# Patient Record
Sex: Female | Born: 1955 | Race: Black or African American | Hispanic: No | Smoking: Former smoker
Health system: Southern US, Community
[De-identification: ages and names within clinical notes are randomized; demographics above are authoritative.]

## PROBLEM LIST (undated history)

## (undated) DIAGNOSIS — J45909 Unspecified asthma, uncomplicated: Secondary | ICD-10-CM

## (undated) DIAGNOSIS — I517 Cardiomegaly: Secondary | ICD-10-CM

## (undated) DIAGNOSIS — I5189 Other ill-defined heart diseases: Secondary | ICD-10-CM

## (undated) DIAGNOSIS — D75839 Thrombocytosis, unspecified: Secondary | ICD-10-CM

## (undated) DIAGNOSIS — K5792 Diverticulitis of intestine, part unspecified, without perforation or abscess without bleeding: Secondary | ICD-10-CM

## (undated) DIAGNOSIS — M8580 Other specified disorders of bone density and structure, unspecified site: Secondary | ICD-10-CM

## (undated) DIAGNOSIS — G500 Trigeminal neuralgia: Secondary | ICD-10-CM

## (undated) DIAGNOSIS — Z9221 Personal history of antineoplastic chemotherapy: Secondary | ICD-10-CM

## (undated) DIAGNOSIS — D72829 Elevated white blood cell count, unspecified: Secondary | ICD-10-CM

## (undated) DIAGNOSIS — I10 Essential (primary) hypertension: Secondary | ICD-10-CM

## (undated) DIAGNOSIS — E119 Type 2 diabetes mellitus without complications: Secondary | ICD-10-CM

## (undated) DIAGNOSIS — I8500 Esophageal varices without bleeding: Secondary | ICD-10-CM

## (undated) DIAGNOSIS — Z923 Personal history of irradiation: Secondary | ICD-10-CM

## (undated) DIAGNOSIS — R6510 Systemic inflammatory response syndrome (SIRS) of non-infectious origin without acute organ dysfunction: Secondary | ICD-10-CM

## (undated) DIAGNOSIS — K649 Unspecified hemorrhoids: Secondary | ICD-10-CM

## (undated) DIAGNOSIS — M7989 Other specified soft tissue disorders: Secondary | ICD-10-CM

## (undated) DIAGNOSIS — C801 Malignant (primary) neoplasm, unspecified: Secondary | ICD-10-CM

## (undated) DIAGNOSIS — J40 Bronchitis, not specified as acute or chronic: Secondary | ICD-10-CM

## (undated) DIAGNOSIS — A419 Sepsis, unspecified organism: Secondary | ICD-10-CM

## (undated) DIAGNOSIS — B1920 Unspecified viral hepatitis C without hepatic coma: Secondary | ICD-10-CM

## (undated) DIAGNOSIS — C50919 Malignant neoplasm of unspecified site of unspecified female breast: Secondary | ICD-10-CM

## (undated) HISTORY — DX: Esophageal varices without bleeding: I85.00

## (undated) HISTORY — PX: US LT BREAST BX W LOC DEV 1ST LESION IMG BX SPEC US GUIDE: IMG5431

## (undated) HISTORY — DX: Other specified disorders of bone density and structure, unspecified site: M85.80

## (undated) HISTORY — DX: Other specified soft tissue disorders: M79.89

## (undated) HISTORY — PX: UPPER GI ENDOSCOPY: SHX6162

## (undated) HISTORY — DX: Elevated white blood cell count, unspecified: D72.829

## (undated) HISTORY — DX: Unspecified hemorrhoids: K64.9

## (undated) HISTORY — DX: Diverticulitis of intestine, part unspecified, without perforation or abscess without bleeding: K57.92

---

## 2001-02-12 DIAGNOSIS — Z853 Personal history of malignant neoplasm of breast: Secondary | ICD-10-CM

## 2001-02-12 HISTORY — DX: Personal history of malignant neoplasm of breast: Z85.3

## 2001-02-12 HISTORY — PX: MASTECTOMY: SHX3

## 2001-02-12 HISTORY — PX: BREAST SURGERY: SHX581

## 2004-03-23 ENCOUNTER — Other Ambulatory Visit: Payer: Self-pay

## 2004-03-23 ENCOUNTER — Emergency Department: Payer: Self-pay | Admitting: Emergency Medicine

## 2004-04-26 DIAGNOSIS — B182 Chronic viral hepatitis C: Secondary | ICD-10-CM | POA: Insufficient documentation

## 2004-11-30 DIAGNOSIS — F329 Major depressive disorder, single episode, unspecified: Secondary | ICD-10-CM | POA: Insufficient documentation

## 2004-11-30 DIAGNOSIS — F32A Depression, unspecified: Secondary | ICD-10-CM | POA: Insufficient documentation

## 2006-01-15 ENCOUNTER — Emergency Department: Payer: Self-pay | Admitting: Emergency Medicine

## 2006-01-15 ENCOUNTER — Other Ambulatory Visit: Payer: Self-pay

## 2006-10-06 ENCOUNTER — Emergency Department: Payer: Self-pay | Admitting: Unknown Physician Specialty

## 2006-10-20 ENCOUNTER — Emergency Department: Payer: Self-pay

## 2006-10-20 ENCOUNTER — Other Ambulatory Visit: Payer: Self-pay

## 2006-11-17 ENCOUNTER — Emergency Department: Payer: Self-pay | Admitting: Emergency Medicine

## 2006-12-16 ENCOUNTER — Other Ambulatory Visit: Payer: Self-pay

## 2006-12-17 ENCOUNTER — Inpatient Hospital Stay: Payer: Self-pay

## 2006-12-17 ENCOUNTER — Other Ambulatory Visit: Payer: Self-pay

## 2007-01-30 ENCOUNTER — Ambulatory Visit: Payer: Self-pay | Admitting: Gastroenterology

## 2007-09-29 ENCOUNTER — Emergency Department: Payer: Self-pay | Admitting: Emergency Medicine

## 2007-09-29 ENCOUNTER — Other Ambulatory Visit: Payer: Self-pay

## 2008-09-04 ENCOUNTER — Emergency Department: Payer: Self-pay | Admitting: Emergency Medicine

## 2009-04-27 ENCOUNTER — Emergency Department: Payer: Self-pay | Admitting: Emergency Medicine

## 2013-01-18 ENCOUNTER — Emergency Department: Payer: Self-pay | Admitting: Emergency Medicine

## 2013-01-18 LAB — CBC
HCT: 39.5 % (ref 35.0–47.0)
MCH: 29.8 pg (ref 26.0–34.0)
MCHC: 33.4 g/dL (ref 32.0–36.0)
MCV: 89 fL (ref 80–100)
WBC: 16.2 10*3/uL — ABNORMAL HIGH (ref 3.6–11.0)

## 2013-01-18 LAB — TROPONIN I
Troponin-I: <0.02 ng/mL
Troponin-I: <0.02 ng/mL

## 2013-01-18 LAB — COMPREHENSIVE METABOLIC PANEL
Alkaline Phosphatase: 110 U/L
Bilirubin,Total: 0.2 mg/dL (ref 0.2–1.0)
Calcium, Total: 9.2 mg/dL (ref 8.5–10.1)
Chloride: 106 mmol/L (ref 98–107)
Co2: 29 mmol/L (ref 21–32)
Creatinine: 1.26 mg/dL (ref 0.60–1.30)
EGFR (Non-African Amer.): 47 — ABNORMAL LOW
Glucose: 136 mg/dL — ABNORMAL HIGH (ref 65–99)
Potassium: 3.5 mmol/L (ref 3.5–5.1)
SGOT(AST): 29 U/L (ref 15–37)
Sodium: 138 mmol/L (ref 136–145)
Total Protein: 8.3 g/dL — ABNORMAL HIGH (ref 6.4–8.2)

## 2013-01-18 LAB — CK TOTAL AND CKMB (NOT AT ARMC): CK-MB: 0.6 ng/mL (ref 0.5–3.6)

## 2013-02-11 ENCOUNTER — Emergency Department: Payer: Self-pay | Admitting: Emergency Medicine

## 2013-10-03 ENCOUNTER — Observation Stay: Payer: Self-pay | Admitting: Internal Medicine

## 2013-10-03 LAB — CBC
HCT: 39.7 % (ref 35.0–47.0)
HGB: 12.9 g/dL (ref 12.0–16.0)
MCH: 29.6 pg (ref 26.0–34.0)
MCHC: 32.5 g/dL (ref 32.0–36.0)
MCV: 91 fL (ref 80–100)
Platelet: 372 10*3/uL (ref 150–440)
RBC: 4.36 10*6/uL (ref 3.80–5.20)
RDW: 13.7 % (ref 11.5–14.5)
WBC: 15.5 10*3/uL — ABNORMAL HIGH (ref 3.6–11.0)

## 2013-10-03 LAB — BASIC METABOLIC PANEL
Anion Gap: 11 (ref 7–16)
BUN: 23 mg/dL — ABNORMAL HIGH (ref 7–18)
Calcium, Total: 8.6 mg/dL (ref 8.5–10.1)
Chloride: 105 mmol/L (ref 98–107)
Co2: 24 mmol/L (ref 21–32)
Creatinine: 1.31 mg/dL — ABNORMAL HIGH (ref 0.60–1.30)
EGFR (African American): 52 — ABNORMAL LOW
EGFR (Non-African Amer.): 45 — ABNORMAL LOW
Glucose: 234 mg/dL — ABNORMAL HIGH (ref 65–99)
OSMOLALITY: 291 (ref 275–301)
Potassium: 3.3 mmol/L — ABNORMAL LOW (ref 3.5–5.1)
Sodium: 140 mmol/L (ref 136–145)

## 2013-10-03 LAB — CK TOTAL AND CKMB (NOT AT ARMC)
CK, Total: 110 U/L
CK, Total: 138 U/L
CK-MB: 1.2 ng/mL (ref 0.5–3.6)
CK-MB: 1.2 ng/mL (ref 0.5–3.6)

## 2013-10-03 LAB — TROPONIN I
Troponin-I: <0.02 ng/mL
Troponin-I: <0.02 ng/mL

## 2013-11-27 DIAGNOSIS — J4540 Moderate persistent asthma, uncomplicated: Secondary | ICD-10-CM | POA: Insufficient documentation

## 2014-01-19 DIAGNOSIS — K219 Gastro-esophageal reflux disease without esophagitis: Secondary | ICD-10-CM

## 2014-01-19 DIAGNOSIS — IMO0001 Reserved for inherently not codable concepts without codable children: Secondary | ICD-10-CM | POA: Insufficient documentation

## 2014-01-23 ENCOUNTER — Emergency Department: Payer: Self-pay | Admitting: Emergency Medicine

## 2014-01-23 LAB — BASIC METABOLIC PANEL
Anion Gap: 8 (ref 7–16)
BUN: 16 mg/dL (ref 7–18)
CALCIUM: 8.8 mg/dL (ref 8.5–10.1)
Chloride: 104 mmol/L (ref 98–107)
Co2: 28 mmol/L (ref 21–32)
Creatinine: 1.13 mg/dL (ref 0.60–1.30)
GFR CALC NON AF AMER: 52 — AB
GLUCOSE: 187 mg/dL — AB (ref 65–99)
OSMOLALITY: 286 (ref 275–301)
Potassium: 3.4 mmol/L — ABNORMAL LOW (ref 3.5–5.1)
SODIUM: 140 mmol/L (ref 136–145)

## 2014-01-23 LAB — CBC
HCT: 41.5 % (ref 35.0–47.0)
HGB: 13.8 g/dL (ref 12.0–16.0)
MCH: 29.6 pg (ref 26.0–34.0)
MCHC: 33.2 g/dL (ref 32.0–36.0)
MCV: 89 fL (ref 80–100)
Platelet: 420 10*3/uL (ref 150–440)
RBC: 4.65 10*6/uL (ref 3.80–5.20)
RDW: 13.2 % (ref 11.5–14.5)
WBC: 12 10*3/uL — ABNORMAL HIGH (ref 3.6–11.0)

## 2014-01-23 LAB — TROPONIN I: Troponin-I: <0.02 ng/mL

## 2014-01-23 LAB — PRO B NATRIURETIC PEPTIDE: B-Type Natriuretic Peptide: 82 pg/mL (ref 0–125)

## 2014-02-16 DIAGNOSIS — I5189 Other ill-defined heart diseases: Secondary | ICD-10-CM | POA: Insufficient documentation

## 2014-02-16 DIAGNOSIS — I517 Cardiomegaly: Secondary | ICD-10-CM | POA: Insufficient documentation

## 2014-06-05 NOTE — H&P (Signed)
PATIENT NAME:  Gail Stevenson, Gail Stevenson MR#:  [id number] DATE OF BIRTH:  [birth year]  DATE OF ADMISSION:  10/03/2013  PRIMARY CARE PHYSICIAN:  [facility].   REFERRING PHYSICIAN: Dr. Beather Arbour.   CHIEF COMPLAINT: Chest pain.   HISTORY OF PRESENT ILLNESS: Gail Stevenson is a 59 year old female with a history of hypertension, hyperlipidemia, diabetes mellitus, peptic ulcer disease who comes to the Emergency Department with complaints of chest pain that started since this morning. The patient states has been experiencing this chest pain on and off for the last 1 month. The patient experiences all over the chest. No specific area. The pain is sharp in nature lasts a few seconds to a few minutes. Sometimes the pain is associated with exertion usually relieved by rest. Today, the patient was sitting and watching someone being evaluated for chest pain by EMS. During that time the patient started to experience severe chest pain. Concerning this, the same EMS people took the patient in the ambulance and brought her to the Emergency Department. Work-up in the Emergency Department with EKG and cardiac enzymes were unremarkable. The patient states had similar chest pain about 4 years back. At that time, the patient had a stress test done, which came back to be unremarkable. The patient refused aspirin as the patient has previous history of peptic ulcer disease.   PAST MEDICAL HISTORY:  1. Peptic ulcer disease.  2. Hepatitis B.  3. Asthma.  4. Chest pain.  5. Hypertension.  6. Diabetes mellitus.  7. Osteoarthritis.   PAST SURGICAL HISTORY:  1. Hysterectomy.  2. Salpingo-oophorectomy.  3. Mastectomy of the right breast.  4. C-sections.   ALLERGIES: MORPHINE, ASPIRIN AND PLAVIX.     HOME MEDICATIONS:  1. Tramadol 50 mg every 4 hours as needed.  2. Ranitidine 15 mg 2 times a day.  3. Ramipril 10 mg 2 times a day.  4. Qvar 80 mcg 2 times a day.  5. Zofran 4 mg 3 times a day.  6. Metoclopramide 4 times a day as  needed.  7. Metformin 500 mg 2 times a day.  8. Lovastatin 40 mg once a day.  9. Hydrochlorothiazide 25 mg daily.  10. Fluticasone one spray daily.  11. Codeine/guaifenesin 10 mL every 4 hours as needed.  12. Amlodipine 10 mg once a day.  13. DuoNebs, Combivent 2 puffs 4 times a day as needed.   SOCIAL HISTORY: Quit smoking in her 54s, denies drinking alcohol or using illicit drugs. Currently lives with her daughter.   FAMILY HISTORY: Grandmother had a heart attack at the age of 51. Father died of cancer.   REVIEW OF SYSTEMS:  CONSTITUTIONAL: Experiencing generalized weakness.  EYES: No change in vision.  EARS, NOSE AND THROAT: No change in hearing.  RESPIRATORY: Has cough with mild productive sputum.  GASTROINTESTINAL: No nausea, vomiting, abdominal pain.  GENITOURINARY: No dysuria or hematuria.  HEMATOLOGIC: No easy bruising or bleeding.  SKIN: No rash or lesions.  MUSCULOSKELETAL: Has osteoarthritis.  NEUROLOGIC: No weakness or numbness in any part of the body.  ENDOCRINE: Has a diagnosis of diabetes mellitus.   PHYSICAL EXAMINATION:  GENERAL: This is a well-built, well-nourished, age-appropriate female lying down in the bed, not in distress.  VITAL SIGNS: Temperature 97.8, pulse 87, blood pressure 126/61, respiratory rate of 16, oxygen saturation 100% on room air.  HEENT: Head normocephalic, atraumatic. There is no scleral icterus. Conjunctivae normal. Pupils equal and reactive. Mucous membranes moist. No pharyngeal erythema.  NECK: Supple. No lymphadenopathy. No JVD.  No carotid bruit.  CHEST: Has focal tenderness on the right parasternal area.   LUNGS: Bilaterally clear to auscultation.  HEART: S1 and S2 regular. No murmurs are heard.  ABDOMEN: Bowel sounds present. Soft, nontender, nondistended. No hepatosplenomegaly.  EXTREMITIES: No pedal edema. Pulses 2+.  SKIN: No rash or lesions.  MUSCULOSKELETAL: Good range of motion in all the extremities.  NEUROLOGIC: The patient  is alert, oriented to place, person, and time. Cranial nerves II through XII intact. Motor 5/5 in upper and lower extremities.   LABORATORY DATA: BMP, BUN 23, creatinine of 1.31, potassium 3.3. The rest of the values are within normal limits.   CBC: WBC of 15, hemoglobin of 12.9, platelet count of 372,000. Troponin less than 0.02. Chest x-ray, one view portable: No acute cardiopulmonary disease.   EKG, 12-lead: Normal sinus rhythm with no ST-T wave abnormalities.   ASSESSMENT AND PLAN: Gail Stevenson is a 59 year old female who comes to the Emergency Department with complaints of chest pain. It seems to be more of atypical pain.   1. Chest pain. This seems to be more of a musculoskeletal pain considering the patient's history of diabetes mellitus and chest pain with exertion, admit the patient under observation, cycle cardiac enzymes x 3. If negative, the patient could be discharged home and can have a stress test as an outpatient as a stress test may not be available over the weekend.  2. Diabetes mellitus We will hold the metformin for now anticipating possible heart catheterization.  3. Hypertension, currently well-controlled. Continue with the Ramipril and amlodipine.  4. Keep the patient on deep vein thrombosis prophylaxis with Lovenox.   TIME SPENT: 50 minutes.    ____________________________ Monica Becton, MD pv:JT D: 10/03/2013 01:51:04 ET T: 10/03/2013 02:10:47 ET JOB#: [id number]  cc: Monica Becton, MD, <Dictator> Monica Becton MD ELECTRONICALLY SIGNED 10/07/2013 0:16

## 2014-06-05 NOTE — Discharge Summary (Signed)
PATIENT NAME:  Gail Stevenson, Gail Stevenson MR#:  [id number] DATE OF BIRTH:  30-May-1955  DATE OF ADMISSION:  10/03/2013 DATE OF DISCHARGE:  10/03/2013  ADMITTING DIAGNOSIS: Chest pain.   DISCHARGE DIAGNOSES:  1.  Chest pain, felt to be musculoskeletal in nature, status post Lexi MIBI as well as CT scan of the chest per pulmonary embolism protocol.  2.  Breast discharge. The patient needs outpatient followup with her primary care provider. She has mammography scheduled in the coming weeks.  3.  Diabetes.  4.  Hypertension.  5.  Morbid obesity.  6.  Peptic ulcer disease.  7.  History of hepatitis B.  8.  History of asthma.  9.  History of chest pain in the past.  10.  Osteoarthritis.  11.  Status post hysterectomy.  12.  Status post salpingo-oophorectomy. 13.  Status post right breast mastectomy. 14.  Status post cesarean section.   CONSULTANTS: None.   PERTINENT LABORATORIES AND EVALUATIONS: Admitting glucose 234, BUN 23, creatinine 1.31, sodium 140, potassium 3.3, chloride 105, CO2 is 24. WBC 15.5, hemoglobin 12.9, platelet count 372,000. Chest x-ray showed no acute cardiopulmonary processes. Troponin was less than 0.02 x 3. The patient's myocardial perfusion scan showed no significant wall motion abnormality. Ejection fraction 67%. Pharmacological myocardial perfusion study with regional moderate scar in the inferior and apical territory. There are no EKG changes concerning for ischemia. CT per PE protocol showed no evidence of PE. There are areas of patchy atelectasis bilaterally, mild lower lobe bronchiectatic changes. No pulmonary embolism. No airspace consolidation or adenopathy. Fatty changes in the liver with small hiatal hernia.   HOSPITAL COURSE: Please refer to H and P done by the admitting physician. The patient is a 59 year old African American female with a history of hypertension, diabetes, peptic ulcer disease, comes to the ED with chest pain that has been going on since this morning. The  patient has had chest pain ongoing for the past 1 month. She initially stated that it was not in any specific area; however, subsequently she had significant reproducible pain in her substernal region with palpation. The patient was evaluated and underwent a CT per PE protocol as well as Lexi MIBI, which were both negative. The patient is being treated with anti-inflammatories. If the pain persists, she may need to have a cardiac catheterization. The patient also a was noted to have a leukocytosis and a slightly elevated renal creatinine, which needs to be followed up as an outpatient. At this time, she is stable for discharge.   DISCHARGE MEDICATIONS: Ramipril 10 one tablet p.o. b.i.d., Flovent p.r.n., albuterol ipratropium 2 puffs inhalation 4 times a day as needed, amlodipine 10 daily, hydrochlorothiazide 25 p.o. daily, lovastatin 40 at bedtime, metformin 500 one tablet p.o. b.i.d., Reglan 5 mL 4 times a day every a.c. and at bedtime, ranitidine 10 mL b.i.d. as needed, QVAR 80 mcg 1 puff b.i.d. as needed, ibuprofen 400 mg 1 tablet p.o. q. 6 p.r.n., acetaminophen/oxycodone 325/5 mg 1 tablet p.o. q. 6 p.r.n. for pain.   DIET: Low sodium, low fat, low cholesterol, carbohydrate -controlled diet.   ACTIVITY: As tolerated.   DISCHARGE FOLLOWUP: Follow up with primary MD in 1-2 weeks. If symptoms persist, needs to be followed up with cardiology.   TIME SPENT: Thirty-five minutes on this discharge.    ____________________________ Lafonda Mosses. Posey Pronto, MD shp:TT D: 10/04/2013 08:24:34 ET T: 10/04/2013 14:07:37 ET JOB#: [id number]  cc: Adley Castello H. Posey Pronto, MD, <Dictator> Alric Seton MD ELECTRONICALLY SIGNED 10/14/2013 15:36

## 2014-07-18 ENCOUNTER — Emergency Department
Admission: EM | Admit: 2014-07-18 | Discharge: 2014-07-18 | Disposition: A | Discharge status: Home / Self Care | Payer: Medicaid Other | Attending: Emergency Medicine | Admitting: Emergency Medicine

## 2014-07-18 ENCOUNTER — Encounter: Payer: Self-pay | Admitting: Emergency Medicine

## 2014-07-18 ENCOUNTER — Emergency Department: Payer: Medicaid Other

## 2014-07-18 DIAGNOSIS — E876 Hypokalemia: Secondary | ICD-10-CM | POA: Diagnosis not present

## 2014-07-18 DIAGNOSIS — J069 Acute upper respiratory infection, unspecified: Secondary | ICD-10-CM | POA: Diagnosis not present

## 2014-07-18 DIAGNOSIS — H6123 Impacted cerumen, bilateral: Secondary | ICD-10-CM | POA: Insufficient documentation

## 2014-07-18 DIAGNOSIS — Z87891 Personal history of nicotine dependence: Secondary | ICD-10-CM | POA: Diagnosis not present

## 2014-07-18 DIAGNOSIS — H9201 Otalgia, right ear: Secondary | ICD-10-CM | POA: Diagnosis present

## 2014-07-18 HISTORY — DX: Malignant (primary) neoplasm, unspecified: C80.1

## 2014-07-18 LAB — CBC WITH DIFFERENTIAL/PLATELET
BASOS ABS: 0.2 10*3/uL — AB (ref 0–0.1)
Basophils Relative: 1 %
Eosinophils Absolute: 0.2 10*3/uL (ref 0–0.7)
Eosinophils Relative: 1 %
HCT: 41.7 % (ref 35.0–47.0)
HEMOGLOBIN: 13.8 g/dL (ref 12.0–16.0)
LYMPHS ABS: 6.8 10*3/uL — AB (ref 1.0–3.6)
Lymphocytes Relative: 41 %
MCH: 29.6 pg (ref 26.0–34.0)
MCHC: 33 g/dL (ref 32.0–36.0)
MCV: 89.6 fL (ref 80.0–100.0)
Monocytes Absolute: 1.7 10*3/uL — ABNORMAL HIGH (ref 0.2–0.9)
Monocytes Relative: 10 %
Neutro Abs: 7.8 10*3/uL — ABNORMAL HIGH (ref 1.4–6.5)
Neutrophils Relative %: 47 %
Platelets: 411 10*3/uL (ref 150–440)
RBC: 4.65 MIL/uL (ref 3.80–5.20)
RDW: 13 % (ref 11.5–14.5)
WBC: 16.6 10*3/uL — ABNORMAL HIGH (ref 3.6–11.0)

## 2014-07-18 LAB — BASIC METABOLIC PANEL
Anion gap: 10 (ref 5–15)
BUN: 15 mg/dL (ref 6–20)
CO2: 29 mmol/L (ref 22–32)
Calcium: 8.9 mg/dL (ref 8.9–10.3)
Chloride: 101 mmol/L (ref 101–111)
Creatinine, Ser: 0.99 mg/dL (ref 0.44–1.00)
GFR calc Af Amer: >60 mL/min (ref >60)
GFR calc non Af Amer: >60 mL/min (ref >60)
Glucose, Bld: 166 mg/dL — ABNORMAL HIGH (ref 65–99)
Potassium: 3.3 mmol/L — ABNORMAL LOW (ref 3.5–5.1)
Sodium: 140 mmol/L (ref 135–145)

## 2014-07-18 MED ORDER — POTASSIUM CHLORIDE ER 10 MEQ PO TBCR: 10.0000 meq | EXTENDED_RELEASE_TABLET | Freq: Every day | ORAL | Status: DC

## 2014-07-18 MED ORDER — POTASSIUM CHLORIDE CRYS ER 20 MEQ PO TBCR
EXTENDED_RELEASE_TABLET | ORAL | Status: AC
  Filled 2014-07-18: qty 1

## 2014-07-18 MED ORDER — CARBAMIDE PEROXIDE 6.5 % OT SOLN: 5.0000 [drp] | Freq: Two times a day (BID) | OTIC | Status: AC

## 2014-07-18 MED ORDER — POTASSIUM CHLORIDE CRYS ER 20 MEQ PO TBCR
10.0000 meq | EXTENDED_RELEASE_TABLET | Freq: Once | ORAL | Status: AC
  Administered 2014-07-18: 10 meq via ORAL

## 2014-07-18 MED ORDER — BENZONATATE 100 MG PO CAPS: 100.0000 mg | ORAL_CAPSULE | Freq: Three times a day (TID) | ORAL | Status: AC | PRN

## 2014-07-18 NOTE — ED Provider Notes (Signed)
[facility] Emergency Department Provider Note  ____________________________________________  Time seen:2105 I have reviewed the triage vital signs and the nursing notes.   HISTORY  Chief Complaint Otalgia   HPI Gail Stevenson is a 59 y.o. female is here with complaint of right ear pain for 1 week. She states the pain is worse today. She denies any fever or chills, no nausea or vomiting. She has not taken any medication for this.she states that currently her pain is a 10 out of 10. She has a history of problems with wax in her ears.  She also complains of some edema in her ankles. She was seen by her PCP in [facility] a month or so ago. At that time she was given some fluid pills for 5 days. She would also like to be seen for this as well. There is no pain involved in this. She is unaware of the name of her diaphoretic that was given to her.  She's also had a cough that she would like to have checked out. She is not taking any over-the-counter medication for this. She denies any congestion, rhinorrhea, no productive cough, no shortness of breath. She is a cold symptoms for approximately one week. She also denies any fever with this as well. Appetite has remained the same and activity normal.   Past Medical History  Diagnosis Date  . 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   Past Surgical History  Procedure Laterality Date  . Mastectomy Right 2003    total  . Breast surgery Right 2003    total mastectomy    Current Outpatient Rx  Name  Route  Sig  Dispense  Refill  . benzonatate (TESSALON PERLES) 100 MG capsule   Oral   Take 1 capsule (100 mg total) by mouth 3 (three) times daily as needed for cough.   30 capsule   0   . carbamide peroxide (DEBROX) 6.5 % otic solution   Both Ears   Place 5 drops into both ears 2 (two) times daily. For 3 days   15 mL   2   . potassium chloride  (K-DUR) 10 MEQ tablet   Oral   Take 1 tablet (10 mEq total) by mouth daily.   7 tablet   0     Allergies Patanol  History reviewed. No pertinent family history.  Social History History  Substance Use Topics  . Smoking status: Former Research scientist (life sciences)  . Smokeless tobacco: Not on file  . Alcohol Use: No    Review of Systems Constitutional: No fever/chills Eyes: No visual changes. ENT: No sore throat. No hearing changes Cardiovascular: Denies chest pain. Respiratory: Denies shortness of breath. Nonproductive cough for approximately one week. Gastrointestinal: No abdominal pain.  No nausea, no vomiting.  No diarrhea.  No constipation. Genitourinary: Negative for dysuria. Musculoskeletal: Negative for back pain. Positive for ankle edema occasionally Skin: Negative for rash. Neurological: Negative for headaches, focal weakness or numbness.  10-point ROS otherwise negative.  ____________________________________________   PHYSICAL EXAM:  VITAL SIGNS: ED Triage Vitals  Enc Vitals Group     BP 07/18/14 2002 136/61 mmHg     Pulse Rate 07/18/14 2002 91     Resp 07/18/14 2002 18     Temp 07/18/14 2002 97.9 F (36.6 C)     Temp Source 07/18/14 2002 Oral     SpO2 07/18/14 2002 98 %  Weight 07/18/14 2002 210 lb (95.255 kg)     Height 07/18/14 2002 5\' 2"  (1.575 m)     Head Cir --      Peak Flow --      Pain Score 07/18/14 2002 10     Pain Loc --      Pain Edu? --      Excl. in [place]? --     Constitutional: Alert and oriented. Well appearing and in no acute distress. Occasional dry cough is noted Eyes: Conjunctivae are normal. PERRL. EOMI. Head: Atraumatic. Nose: No congestion/rhinnorhea.  Bilateral EACs are occluded with cerumen. TMs are not visible Mouth/Throat: Mucous membranes are moist.  Oropharynx non-erythematous. Neck: No stridor.  supple Hematological/Lymphatic/Immunilogical: No cervical lymphadenopathy. Cardiovascular: Normal rate, regular rhythm. Grossly normal heart  sounds.  Good peripheral circulation. Pulses lower extremities are present Respiratory: Normal respiratory effort.  No retractions. Lungs CTAB. No wheezes are noted. Patient is able to speak in complete sentences without any difficulty Gastrointestinal: Soft and nontender. No distention. No abdominal bruits. No CVA tenderness. Musculoskeletal: No lower extremity tenderness nor edema.  No joint effusions. Lateral ankles there is soft tissue edema noted,  this is nonpitting in nature. nontender on palpation. Motor sensory intact. Neurologic:  Normal speech and language. No gross focal neurologic deficits are appreciated. Speech is normal. No gait instability. Skin:  Skin is warm, dry and intact. No rash noted. Psychiatric: Mood and affect are normal. Speech and behavior are normal.  ____________________________________________   LABS (all labs ordered are listed, but only abnormal results are displayed)  Labs Reviewed  CBC WITH DIFFERENTIAL/PLATELET - Abnormal; Notable for the following:    WBC 16.6 (*)    Neutro Abs 7.8 (*)    Lymphs Abs 6.8 (*)    Monocytes Absolute 1.7 (*)    Basophils Absolute 0.2 (*)    All other components within normal limits  BASIC METABOLIC PANEL - Abnormal; Notable for the following:    Potassium 3.3 (*)    Glucose, Bld 166 (*)    All other components within normal limits   RADIOLOGY  Chest x-ray shows no active cardiopulmonary disease. ____________________________________________   PROCEDURES  Procedure(s) performed: None  Critical Care performed: No  ____________________________________________   INITIAL IMPRESSION / ASSESSMENT AND PLAN / ED COURSE  Pertinent labs & imaging results that were available during my care of the patient were reviewed by me and considered in my medical decision making (see chart for details).  This patient has multiple medical problems addressed in this visit today. She is given a prescription for eardrops for her  cerumen impaction. She was placed on potassium for hypokalemia. She is to follow-up with her PCP and is aware that she is not being given a diuretic  tonight because of her low potassium. She is aware that her WBC is elevated which could be viral in nature. She is to follow-up with her doctor if any continued problems or return to the emergency room if any severe worsening of her symptoms. She was made aware that her chest x-ray did not show pneumonia or bronchitis. She was given a prescription for Tessalon Perles for her cough. She is also to avoid salty foods to reduce swelling if possible in her ankles. Patient had no problems while in the emergency room and was ambulatory during her entire stay without any difficulty ____________________________________________   FINAL CLINICAL IMPRESSION(S) / ED DIAGNOSES  Final diagnoses:  Hypokalemia  Cerumen impaction, bilateral  Upper respiratory infection, acute  Johnn Hai, PA-C 07/19/14 [place], PA-C 07/19/14 0340  Nance Pear, MD 07/21/14 1032

## 2014-07-18 NOTE — Discharge Instructions (Signed)
Hypokalemia Hypokalemia means that the amount of potassium in the blood is lower than normal.Potassium is a chemical, called an electrolyte, that helps regulate the amount of fluid in the body. It also stimulates muscle contraction and helps nerves function properly.Most of the body's potassium is inside of cells, and only a very small amount is in the blood. Because the amount in the blood is so small, minor changes can be life-threatening. CAUSES  Antibiotics.  Diarrhea or vomiting.  Using laxatives too much, which can cause diarrhea.  Chronic kidney disease.  Water pills (diuretics).  Eating disorders (bulimia).  Low magnesium level.  Sweating a lot. SIGNS AND SYMPTOMS  Weakness.  Constipation.  Fatigue.  Muscle cramps.  Mental confusion.  Skipped heartbeats or irregular heartbeat (palpitations).  Tingling or numbness. DIAGNOSIS  Your health care provider can diagnose hypokalemia with blood tests. In addition to checking your potassium level, your health care provider may also check other lab tests. TREATMENT Hypokalemia can be treated with potassium supplements taken by mouth or adjustments in your current medicines. If your potassium level is very low, you may need to get potassium through a vein (IV) and be monitored in the hospital. A diet high in potassium is also helpful. Foods high in potassium are:  Nuts, such as peanuts and pistachios.  Seeds, such as sunflower seeds and pumpkin seeds.  Peas, lentils, and lima beans.  Whole grain and bran cereals and breads.  Fresh fruit and vegetables, such as apricots, avocado, bananas, cantaloupe, kiwi, oranges, tomatoes, asparagus, and potatoes.  Orange and tomato juices.  Red meats.  Fruit yogurt. HOME CARE INSTRUCTIONS  Take all medicines as prescribed by your health care provider.  Maintain a healthy diet by including nutritious food, such as fruits, vegetables, nuts, whole grains, and lean meats.  If  you are taking a laxative, be sure to follow the directions on the label. SEEK MEDICAL CARE IF:  Your weakness gets worse.  You feel your heart pounding or racing.  You are vomiting or having diarrhea.  You are diabetic and having trouble keeping your blood glucose in the normal range. SEEK IMMEDIATE MEDICAL CARE IF:  You have chest pain, shortness of breath, or dizziness.  You are vomiting or having diarrhea for more than 2 days.  You faint. MAKE SURE YOU:   Understand these instructions.  Will watch your condition.  Will get help right away if you are not doing well or get worse. Document Released: 01/29/2005 Document Revised: 11/19/2012 Document Reviewed: 08/01/2012 [facility] Patient Information 2015 [place], [place]. This information is not intended to replace advice given to you by your health care provider. Make sure you discuss any questions you have with your health care provider.   FOLLOW UP WITH YOUR DOCTOR THIS WEEK ABOUT YOUR LOW POTASSIUM TESSALON FOR COUGH AS NEEDED.   WATCH THE AMOUNT OF SALT THAT YOU ARE EATING TO REDUCE THE SWELLING OF YOUR ANKLES USE EAR DROPS FOR 3 DAYS BOTH EARS RETURN TO ER IF ANY SEVERE WORSENING OF YOUR SYMPTOMS OR URGENT CONCERNS

## 2014-07-18 NOTE — ED Notes (Signed)
Patient transported to X-ray 

## 2014-07-18 NOTE — ED Notes (Signed)
Pt has right earache for 1 week.  Reports pain worse today.  No drainage.  States i had a lot of wax in it.  Denies sinus pain/pressure.  Pt alert.

## 2014-07-18 NOTE — ED Notes (Signed)
Patient with complaint of right ear pain times one week.

## 2014-09-21 DIAGNOSIS — G500 Trigeminal neuralgia: Secondary | ICD-10-CM | POA: Insufficient documentation

## 2014-10-30 DIAGNOSIS — E739 Lactose intolerance, unspecified: Secondary | ICD-10-CM | POA: Insufficient documentation

## 2014-12-05 ENCOUNTER — Emergency Department: Payer: Medicaid Other

## 2014-12-05 ENCOUNTER — Emergency Department
Admission: EM | Admit: 2014-12-05 | Discharge: 2014-12-05 | Disposition: A | Discharge status: Home / Self Care | Payer: Medicaid Other | Attending: Emergency Medicine | Admitting: Emergency Medicine

## 2014-12-05 ENCOUNTER — Other Ambulatory Visit: Payer: Self-pay

## 2014-12-05 ENCOUNTER — Encounter: Payer: Self-pay | Admitting: Emergency Medicine

## 2014-12-05 DIAGNOSIS — Z79899 Other long term (current) drug therapy: Secondary | ICD-10-CM | POA: Insufficient documentation

## 2014-12-05 DIAGNOSIS — Z87891 Personal history of nicotine dependence: Secondary | ICD-10-CM | POA: Diagnosis not present

## 2014-12-05 DIAGNOSIS — R0789 Other chest pain: Secondary | ICD-10-CM | POA: Insufficient documentation

## 2014-12-05 DIAGNOSIS — R079 Chest pain, unspecified: Secondary | ICD-10-CM

## 2014-12-05 DIAGNOSIS — R05 Cough: Secondary | ICD-10-CM | POA: Insufficient documentation

## 2014-12-05 LAB — BASIC METABOLIC PANEL
ANION GAP: 9 (ref 5–15)
BUN: 18 mg/dL (ref 6–20)
CO2: 25 mmol/L (ref 22–32)
Calcium: 8.8 mg/dL — ABNORMAL LOW (ref 8.9–10.3)
Chloride: 103 mmol/L (ref 101–111)
Creatinine, Ser: 1.06 mg/dL — ABNORMAL HIGH (ref 0.44–1.00)
GFR calc Af Amer: >60 mL/min (ref >60)
GFR, EST NON AFRICAN AMERICAN: 56 mL/min — AB (ref >60)
GLUCOSE: 232 mg/dL — AB (ref 65–99)
Potassium: 3.1 mmol/L — ABNORMAL LOW (ref 3.5–5.1)
Sodium: 137 mmol/L (ref 135–145)

## 2014-12-05 LAB — CBC WITH DIFFERENTIAL/PLATELET
BASOS ABS: 0.1 10*3/uL (ref 0–0.1)
BASOS PCT: 1 %
EOS PCT: 1 %
Eosinophils Absolute: 0.1 10*3/uL (ref 0–0.7)
HCT: 41.3 % (ref 35.0–47.0)
Hemoglobin: 13.5 g/dL (ref 12.0–16.0)
Lymphocytes Relative: 38 %
Lymphs Abs: 4.4 10*3/uL — ABNORMAL HIGH (ref 1.0–3.6)
MCH: 29 pg (ref 26.0–34.0)
MCHC: 32.7 g/dL (ref 32.0–36.0)
MCV: 88.7 fL (ref 80.0–100.0)
MONO ABS: 1 10*3/uL — AB (ref 0.2–0.9)
Monocytes Relative: 9 %
Neutro Abs: 5.9 10*3/uL (ref 1.4–6.5)
Neutrophils Relative %: 51 %
PLATELETS: 370 10*3/uL (ref 150–440)
RBC: 4.65 MIL/uL (ref 3.80–5.20)
RDW: 13.3 % (ref 11.5–14.5)
WBC: 11.6 10*3/uL — ABNORMAL HIGH (ref 3.6–11.0)

## 2014-12-05 LAB — CK: CK TOTAL: 66 U/L (ref 38–234)

## 2014-12-05 LAB — TROPONIN I

## 2014-12-05 MED ORDER — KETOROLAC TROMETHAMINE 30 MG/ML IJ SOLN
30.0000 mg | Freq: Once | INTRAMUSCULAR | Status: AC
  Administered 2014-12-05: 30 mg via INTRAVENOUS
  Filled 2014-12-05: qty 1

## 2014-12-05 MED ORDER — TRAMADOL HCL 50 MG PO TABS: 50.0000 mg | ORAL_TABLET | Freq: Four times a day (QID) | ORAL | Status: DC | PRN

## 2014-12-05 MED ORDER — LORAZEPAM 2 MG/ML IJ SOLN
1.0000 mg | Freq: Once | INTRAMUSCULAR | Status: AC
  Administered 2014-12-05: 1 mg via INTRAVENOUS
  Filled 2014-12-05: qty 1

## 2014-12-05 NOTE — Discharge Instructions (Signed)
Chest Wall Pain °Chest wall pain is pain in or around the bones and muscles of your chest. Sometimes, an injury causes this pain. Sometimes, the cause may not be known. This pain may take several weeks or longer to get better. °HOME CARE °Pay attention to any changes in your symptoms. Take these actions to help with your pain: °· Rest as told by your doctor. °· Avoid activities that cause pain. Try not to use your chest, belly (abdominal), or side muscles to lift heavy things. °· If directed, apply ice to the painful area: °¨ Put ice in a plastic bag. °¨ Place a towel between your skin and the bag. °¨ Leave the ice on for 20 minutes, 2-3 times per day. °· Take over-the-counter and prescription medicines only as told by your doctor. °· Do not use tobacco products, including cigarettes, chewing tobacco, and e-cigarettes. If you need help quitting, ask your doctor. °· Keep all follow-up visits as told by your doctor. This is important. °GET HELP IF: °· You have a fever. °· Your chest pain gets worse. °· You have new symptoms. °GET HELP RIGHT AWAY IF: °· You feel sick to your stomach (nauseous) or you throw up (vomit). °· You feel sweaty or light-headed. °· You have a cough with phlegm (sputum) or you cough up blood. °· You are short of breath. °  °This information is not intended to replace advice given to you by your health care provider. Make sure you discuss any questions you have with your health care provider. °  °Document Released: 07/18/2007 Document Revised: 10/20/2014 Document Reviewed: 04/26/2014 °Elsevier Interactive Patient Education ©2016 [employer] ° °

## 2014-12-05 NOTE — ED Notes (Signed)
Pt presents to ER from church with c/o of cough that started Thursday and chest pain that started today. Pt states she thinks chest pain is due to stress.

## 2014-12-05 NOTE — ED Provider Notes (Signed)
Time Seen: Approximately 1310  I have reviewed the triage notes  Chief Complaint: Cough and Chest Pain   History of Present Illness: Gail Stevenson is a 59 y.o. female who presents with a cough that started on Thursday. Cough is occasionally productive of clear sputum. Patient was at church today and is noticed an increase in her pain with coughing and also with movement. She describes substernal chest discomfort and states she feels that this pain is most likely from "" stress "". She denies any direct trauma to the chest. She denies any shortness of breath, fever, trauma. She denies any radiation of the pain to the arm, jaw, back region. She denies any pulmonary emboli risk factors.  Past Medical History  Diagnosis Date  . 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   Past Surgical History  Procedure Laterality Date  . Mastectomy Right 2003    total  . Breast surgery Right 2003    total mastectomy    Past Surgical History  Procedure Laterality Date  . Mastectomy Right 2003    total  . Breast surgery Right 2003    total mastectomy    Current Outpatient Rx  Name  Route  Sig  Dispense  Refill  . benzonatate (TESSALON PERLES) 100 MG capsule   Oral   Take 1 capsule (100 mg total) by mouth 3 (three) times daily as needed for cough.   30 capsule   0   . carbamide peroxide (DEBROX) 6.5 % otic solution   Both Ears   Place 5 drops into both ears 2 (two) times daily. For 3 days   15 mL   2   . potassium chloride (K-DUR) 10 MEQ tablet   Oral   Take 1 tablet (10 mEq total) by mouth daily.   7 tablet   0   . traMADol (ULTRAM) 50 MG tablet   Oral   Take 1 tablet (50 mg total) by mouth every 6 (six) hours as needed.   20 tablet   0   . traMADol (ULTRAM) 50 MG tablet   Oral   Take 1 tablet (50 mg total) by mouth every 6 (six) hours as needed.   20 tablet   0     Allergies:  Patanol  Family  History: History reviewed. No pertinent family history.  Social History: Social History  Substance Use Topics  . Smoking status: Former Research scientist (life sciences)  . Smokeless tobacco: None  . Alcohol Use: No     Review of Systems:   10 point review of systems was performed and was otherwise negative:  Constitutional: No fever Eyes: No visual disturbances ENT: No sore throat, ear pain Cardiac: chest pain is sharp and transient diffuse across the sternal area in both the anterior chest wall regions. Chest pain is associated with her cough Respiratory: No shortness of breath, wheezing, or stridor Abdomen: No abdominal pain, no vomiting, No diarrhea Endocrine: No weight loss, No night sweats Extremities: No peripheral edema, cyanosis. No calf tenderness or swelling Skin: No rashes, easy bruising Neurologic: No focal weakness, trouble with speech or swollowing Urologic: No dysuria, Hematuria, or urinary frequency   Physical Exam:  ED Triage Vitals  Enc Vitals Group     BP 12/05/14 1310 135/71 mmHg     Pulse Rate 12/05/14 1310 80     Resp 12/05/14 1400 28     Temp 12/05/14 1310  98 F (36.7 C)     Temp Source 12/05/14 1310 Oral     SpO2 12/05/14 1310 99 %     Weight 12/05/14 1310 185 lb 7 oz (84.114 kg)     Height 12/05/14 1310 5\' 2"  (1.575 m)     Head Cir --      Peak Flow --      Pain Score 12/05/14 1312 7     Pain Loc --      Pain Edu? --      Excl. in [place]? --     General: Awake , Alert , and Oriented times 3; GCS 15 Head: Normal cephalic , atraumatic Eyes: Pupils equal , round, reactive to light Nose/Throat: No nasal drainage, patent upper airway without erythema or exudate.  Neck: Supple, Full range of motion, No anterior adenopathy or palpable thyroid masses Lungs: Clear to ascultation without wheezes , rhonchi, or rales Heart: Regular rate, regular rhythm without murmurs , gallops , or rubs Abdomen: Soft, non tender without rebound, guarding , or rigidity; bowel sounds positive  and symmetric in all 4 quadrants. No organomegaly .        Extremities: 2 plus symmetric pulses. No edema, clubbing or cyanosis Neurologic: normal ambulation, Motor symmetric without deficits, sensory intact Skin: warm, dry, no rashes Patient has reproducible pain with palpation across the chest wall and substernal regions. No crepitus or step-off is noted.  Labs:   All laboratory work was reviewed including any pertinent negatives or positives listed below:  Labs Reviewed  BASIC METABOLIC PANEL - Abnormal; Notable for the following:    Potassium 3.1 (*)    Glucose, Bld 232 (*)    Creatinine, Ser 1.06 (*)    Calcium 8.8 (*)    GFR calc non Af Amer 56 (*)    All other components within normal limits  CBC WITH DIFFERENTIAL/PLATELET - Abnormal; Notable for the following:    WBC 11.6 (*)    Lymphs Abs 4.4 (*)    Monocytes Absolute 1.0 (*)    All other components within normal limits  TROPONIN I  CK    EKG:  ED ECG REPORT I, Daymon Larsen, the attending physician, personally viewed and interpreted this ECG.  Date: 12/05/2014 EKG Time: 1307 Rate: 86Rhythm: normal sinus rhythm QRS Axis: normal Intervals: normal ST/T Wave abnormalities: normal Conduction Disutrbances: none Narrative Interpretation: unremarkable    Radiology:   EXAM: CHEST 2 VIEW  COMPARISON: July 18, 2014.  FINDINGS: The heart size and mediastinal contours are within normal limits. Both lungs are clear. No pneumothorax or pleural effusion is noted. The visualized skeletal structures are unremarkable.  IMPRESSION: No active cardiopulmonary disease.    I personally reviewed the radiologic studies     ED Course:  Differential includes all life-threatening causes for chest pain. This includes but is not exclusive to acute coronary syndrome, aortic dissection, pulmonary embolism, cardiac tamponade, community-acquired pneumonia, pericarditis, musculoskeletal chest wall pain, etc. Given the  patient's current clinical presentation and objective findings appears that she may have some bronchitis without any signs of pneumonia. Patient does have a history of reactive airway disease and does have inhalers at home. She was prescribed some general pain medication and advised she can take ibuprofen over-the-counter for what appears to be some musculoskeletal chest wall pain.   Assessment: Acute unspecified chest pain Acute bronchitis Chest wall pain   Final Clinical Impression  Final diagnoses:  Chest pain syndrome     Plan:  Patient was  advised to return immediately if condition worsens. Patient was advised to follow up with her primary care physician or other specialized physicians involved and in their current assessment.             Daymon Larsen, MD 12/05/14 [phone]

## 2015-04-02 DIAGNOSIS — R079 Chest pain, unspecified: Secondary | ICD-10-CM | POA: Insufficient documentation

## 2015-04-14 ENCOUNTER — Encounter: Payer: Self-pay | Admitting: Emergency Medicine

## 2015-04-14 DIAGNOSIS — Z79899 Other long term (current) drug therapy: Secondary | ICD-10-CM

## 2015-04-14 DIAGNOSIS — B182 Chronic viral hepatitis C: Secondary | ICD-10-CM | POA: Diagnosis present

## 2015-04-14 DIAGNOSIS — E871 Hypo-osmolality and hyponatremia: Secondary | ICD-10-CM | POA: Diagnosis present

## 2015-04-14 DIAGNOSIS — Z7984 Long term (current) use of oral hypoglycemic drugs: Secondary | ICD-10-CM

## 2015-04-14 DIAGNOSIS — Z9011 Acquired absence of right breast and nipple: Secondary | ICD-10-CM

## 2015-04-14 DIAGNOSIS — Z853 Personal history of malignant neoplasm of breast: Secondary | ICD-10-CM

## 2015-04-14 DIAGNOSIS — J449 Chronic obstructive pulmonary disease, unspecified: Secondary | ICD-10-CM | POA: Diagnosis present

## 2015-04-14 DIAGNOSIS — E119 Type 2 diabetes mellitus without complications: Secondary | ICD-10-CM | POA: Diagnosis present

## 2015-04-14 DIAGNOSIS — J45909 Unspecified asthma, uncomplicated: Secondary | ICD-10-CM | POA: Diagnosis present

## 2015-04-14 DIAGNOSIS — Z87891 Personal history of nicotine dependence: Secondary | ICD-10-CM

## 2015-04-14 DIAGNOSIS — K5720 Diverticulitis of large intestine with perforation and abscess without bleeding: Principal | ICD-10-CM | POA: Diagnosis present

## 2015-04-14 DIAGNOSIS — E7800 Pure hypercholesterolemia, unspecified: Secondary | ICD-10-CM | POA: Diagnosis present

## 2015-04-14 DIAGNOSIS — I10 Essential (primary) hypertension: Secondary | ICD-10-CM | POA: Diagnosis present

## 2015-04-14 NOTE — ED Notes (Signed)
Patient ambulatory to triage with steady gait, without difficulty or distress noted; pt reports left lower abd pain with sharp pains to mid lower abdomen x 3 days; has had some vomiting

## 2015-04-15 ENCOUNTER — Encounter: Payer: Self-pay | Admitting: Radiology

## 2015-04-15 ENCOUNTER — Emergency Department: Payer: Medicaid Other

## 2015-04-15 ENCOUNTER — Inpatient Hospital Stay
Admission: EM | Admit: 2015-04-15 | Discharge: 2015-04-18 | DRG: 392 | Disposition: A | Discharge status: Home / Self Care | Payer: Medicaid Other | Attending: Surgery | Admitting: Surgery

## 2015-04-15 DIAGNOSIS — E119 Type 2 diabetes mellitus without complications: Secondary | ICD-10-CM | POA: Insufficient documentation

## 2015-04-15 DIAGNOSIS — E7800 Pure hypercholesterolemia, unspecified: Secondary | ICD-10-CM | POA: Diagnosis present

## 2015-04-15 DIAGNOSIS — J45909 Unspecified asthma, uncomplicated: Secondary | ICD-10-CM | POA: Diagnosis present

## 2015-04-15 DIAGNOSIS — B182 Chronic viral hepatitis C: Secondary | ICD-10-CM | POA: Diagnosis present

## 2015-04-15 DIAGNOSIS — J449 Chronic obstructive pulmonary disease, unspecified: Secondary | ICD-10-CM | POA: Diagnosis present

## 2015-04-15 DIAGNOSIS — I10 Essential (primary) hypertension: Secondary | ICD-10-CM | POA: Insufficient documentation

## 2015-04-15 DIAGNOSIS — Z9011 Acquired absence of right breast and nipple: Secondary | ICD-10-CM | POA: Diagnosis not present

## 2015-04-15 DIAGNOSIS — A419 Sepsis, unspecified organism: Secondary | ICD-10-CM

## 2015-04-15 DIAGNOSIS — K5720 Diverticulitis of large intestine with perforation and abscess without bleeding: Secondary | ICD-10-CM | POA: Diagnosis not present

## 2015-04-15 DIAGNOSIS — Z853 Personal history of malignant neoplasm of breast: Secondary | ICD-10-CM | POA: Diagnosis not present

## 2015-04-15 DIAGNOSIS — Z79899 Other long term (current) drug therapy: Secondary | ICD-10-CM | POA: Diagnosis not present

## 2015-04-15 DIAGNOSIS — I152 Hypertension secondary to endocrine disorders: Secondary | ICD-10-CM | POA: Insufficient documentation

## 2015-04-15 DIAGNOSIS — Z87891 Personal history of nicotine dependence: Secondary | ICD-10-CM | POA: Diagnosis not present

## 2015-04-15 DIAGNOSIS — K5792 Diverticulitis of intestine, part unspecified, without perforation or abscess without bleeding: Secondary | ICD-10-CM | POA: Diagnosis present

## 2015-04-15 DIAGNOSIS — Z7984 Long term (current) use of oral hypoglycemic drugs: Secondary | ICD-10-CM | POA: Diagnosis not present

## 2015-04-15 DIAGNOSIS — C50919 Malignant neoplasm of unspecified site of unspecified female breast: Secondary | ICD-10-CM | POA: Diagnosis not present

## 2015-04-15 DIAGNOSIS — D0512 Intraductal carcinoma in situ of left breast: Secondary | ICD-10-CM | POA: Insufficient documentation

## 2015-04-15 DIAGNOSIS — E1159 Type 2 diabetes mellitus with other circulatory complications: Secondary | ICD-10-CM | POA: Insufficient documentation

## 2015-04-15 DIAGNOSIS — E871 Hypo-osmolality and hyponatremia: Secondary | ICD-10-CM | POA: Diagnosis present

## 2015-04-15 HISTORY — DX: Type 2 diabetes mellitus without complications: E11.9

## 2015-04-15 HISTORY — DX: Other ill-defined heart diseases: I51.89

## 2015-04-15 HISTORY — DX: Unspecified viral hepatitis C without hepatic coma: B19.20

## 2015-04-15 HISTORY — DX: Trigeminal neuralgia: G50.0

## 2015-04-15 HISTORY — DX: Unspecified asthma, uncomplicated: J45.909

## 2015-04-15 HISTORY — DX: Essential (primary) hypertension: I10

## 2015-04-15 LAB — LACTIC ACID, PLASMA
Lactic Acid, Venous: 1.2 mmol/L (ref 0.5–2.0)
Lactic Acid, Venous: 0.9 mmol/L (ref 0.5–2.0)

## 2015-04-15 LAB — URINALYSIS COMPLETE WITH MICROSCOPIC (ARMC ONLY)
BACTERIA UA: NONE SEEN
Bilirubin Urine: NEGATIVE
Ketones, ur: NEGATIVE mg/dL
Leukocytes, UA: NEGATIVE
Nitrite: NEGATIVE
PROTEIN: NEGATIVE mg/dL
Specific Gravity, Urine: 1.031 — ABNORMAL HIGH (ref 1.005–1.030)
pH: 6 (ref 5.0–8.0)

## 2015-04-15 LAB — CBC
HEMATOCRIT: 36 % (ref 35.0–47.0)
HEMOGLOBIN: 12.2 g/dL (ref 12.0–16.0)
MCH: 29.5 pg (ref 26.0–34.0)
MCHC: 33.8 g/dL (ref 32.0–36.0)
MCV: 87.4 fL (ref 80.0–100.0)
Platelets: 354 10*3/uL (ref 150–440)
RBC: 4.12 MIL/uL (ref 3.80–5.20)
RDW: 12.4 % (ref 11.5–14.5)
WBC: 25.4 10*3/uL — ABNORMAL HIGH (ref 3.6–11.0)

## 2015-04-15 LAB — HEMOGLOBIN A1C: HEMOGLOBIN A1C: 10.3 % — AB (ref 4.0–6.0)

## 2015-04-15 LAB — COMPREHENSIVE METABOLIC PANEL
ALBUMIN: 3.2 g/dL — AB (ref 3.5–5.0)
ALT: 30 U/L (ref 14–54)
AST: 31 U/L (ref 15–41)
Alkaline Phosphatase: 61 U/L (ref 38–126)
Anion gap: 10 (ref 5–15)
BILIRUBIN TOTAL: 0.4 mg/dL (ref 0.3–1.2)
BUN: 16 mg/dL (ref 6–20)
CO2: 25 mmol/L (ref 22–32)
Calcium: 8.8 mg/dL — ABNORMAL LOW (ref 8.9–10.3)
Chloride: 98 mmol/L — ABNORMAL LOW (ref 101–111)
Creatinine, Ser: 1.01 mg/dL — ABNORMAL HIGH (ref 0.44–1.00)
GFR calc Af Amer: >60 mL/min (ref >60)
GFR calc non Af Amer: 59 mL/min — ABNORMAL LOW (ref >60)
GLUCOSE: 368 mg/dL — AB (ref 65–99)
POTASSIUM: 3.7 mmol/L (ref 3.5–5.1)
SODIUM: 133 mmol/L — AB (ref 135–145)
TOTAL PROTEIN: 7.7 g/dL (ref 6.5–8.1)

## 2015-04-15 LAB — GLUCOSE, CAPILLARY
Glucose-Capillary: 299 mg/dL — ABNORMAL HIGH (ref 65–99)
Glucose-Capillary: 276 mg/dL — ABNORMAL HIGH (ref 65–99)

## 2015-04-15 LAB — LIPASE, BLOOD: Lipase: 27 U/L (ref 11–51)

## 2015-04-15 MED ORDER — HEPARIN SODIUM (PORCINE) 5000 UNIT/ML IJ SOLN
5000.0000 [IU] | Freq: Three times a day (TID) | INTRAMUSCULAR | Status: DC
  Administered 2015-04-15 – 2015-04-18 (×9): 5000 [IU] via SUBCUTANEOUS
  Filled 2015-04-15 (×9): qty 1

## 2015-04-15 MED ORDER — ALBUTEROL SULFATE (2.5 MG/3ML) 0.083% IN NEBU
2.5000 mg | INHALATION_SOLUTION | RESPIRATORY_TRACT | Status: DC | PRN
  Administered 2015-04-16: 2.5 mg via RESPIRATORY_TRACT
  Filled 2015-04-15: qty 3

## 2015-04-15 MED ORDER — ALBUTEROL SULFATE HFA 108 (90 BASE) MCG/ACT IN AERS: 2.0000 | INHALATION_SPRAY | RESPIRATORY_TRACT | Status: DC | PRN

## 2015-04-15 MED ORDER — BUDESONIDE 0.25 MG/2ML IN SUSP
0.2500 mg | Freq: Two times a day (BID) | RESPIRATORY_TRACT | Status: DC
  Administered 2015-04-15 – 2015-04-18 (×6): 0.25 mg via RESPIRATORY_TRACT
  Filled 2015-04-15 (×6): qty 2

## 2015-04-15 MED ORDER — BENZONATATE 100 MG PO CAPS: 100.0000 mg | ORAL_CAPSULE | Freq: Three times a day (TID) | ORAL | Status: DC | PRN

## 2015-04-15 MED ORDER — MORPHINE SULFATE (PF) 4 MG/ML IV SOLN
4.0000 mg | Freq: Once | INTRAVENOUS | Status: AC
  Administered 2015-04-15: 4 mg via INTRAVENOUS
  Filled 2015-04-15: qty 1

## 2015-04-15 MED ORDER — ONDANSETRON HCL 4 MG/2ML IJ SOLN
4.0000 mg | Freq: Once | INTRAMUSCULAR | Status: AC
  Administered 2015-04-15: 4 mg via INTRAVENOUS
  Filled 2015-04-15: qty 2

## 2015-04-15 MED ORDER — SODIUM CHLORIDE 0.9 % IV BOLUS (SEPSIS)
500.0000 mL | Freq: Once | INTRAVENOUS | Status: AC
  Administered 2015-04-15: 500 mL via INTRAVENOUS

## 2015-04-15 MED ORDER — IOHEXOL 350 MG/ML SOLN
100.0000 mL | Freq: Once | INTRAVENOUS | Status: AC | PRN
  Administered 2015-04-15: 100 mL via INTRAVENOUS

## 2015-04-15 MED ORDER — CARBAMIDE PEROXIDE 6.5 % OT SOLN
5.0000 [drp] | Freq: Two times a day (BID) | OTIC | Status: DC
  Filled 2015-04-15: qty 15

## 2015-04-15 MED ORDER — IOHEXOL 240 MG/ML SOLN
25.0000 mL | Freq: Once | INTRAMUSCULAR | Status: AC | PRN
  Administered 2015-04-15: 25 mL via ORAL

## 2015-04-15 MED ORDER — LABETALOL HCL 5 MG/ML IV SOLN
10.0000 mg | INTRAVENOUS | Status: DC | PRN
  Filled 2015-04-15: qty 4

## 2015-04-15 MED ORDER — AMLODIPINE BESYLATE 10 MG PO TABS
10.0000 mg | ORAL_TABLET | Freq: Every day | ORAL | Status: DC
  Administered 2015-04-15 – 2015-04-18 (×4): 10 mg via ORAL
  Filled 2015-04-15 (×4): qty 1

## 2015-04-15 MED ORDER — INSULIN ASPART 100 UNIT/ML ~~LOC~~ SOLN
0.0000 [IU] | Freq: Every day | SUBCUTANEOUS | Status: DC
  Administered 2015-04-17: 3 [IU] via SUBCUTANEOUS
  Filled 2015-04-15: qty 3

## 2015-04-15 MED ORDER — PIPERACILLIN-TAZOBACTAM 3.375 G IVPB
3.3750 g | Freq: Three times a day (TID) | INTRAVENOUS | Status: DC
  Administered 2015-04-15 – 2015-04-18 (×9): 3.375 g via INTRAVENOUS
  Filled 2015-04-15 (×13): qty 50

## 2015-04-15 MED ORDER — HYDROMORPHONE HCL 1 MG/ML IJ SOLN: 1.0000 mg | INTRAMUSCULAR | Status: DC | PRN

## 2015-04-15 MED ORDER — HYDROCHLOROTHIAZIDE 25 MG PO TABS
25.0000 mg | ORAL_TABLET | Freq: Every day | ORAL | Status: DC
  Administered 2015-04-15 – 2015-04-18 (×4): 25 mg via ORAL
  Filled 2015-04-15 (×4): qty 1

## 2015-04-15 MED ORDER — LACTATED RINGERS IV SOLN
INTRAVENOUS | Status: DC
  Administered 2015-04-15 – 2015-04-18 (×7): via INTRAVENOUS

## 2015-04-15 MED ORDER — SODIUM CHLORIDE 0.9 % IV BOLUS (SEPSIS)
1000.0000 mL | Freq: Once | INTRAVENOUS | Status: DC
Start: 2015-04-15 — End: 2015-04-15

## 2015-04-15 MED ORDER — PIPERACILLIN-TAZOBACTAM 3.375 G IVPB
3.3750 g | Freq: Once | INTRAVENOUS | Status: AC
  Administered 2015-04-15: 3.375 g via INTRAVENOUS
  Filled 2015-04-15: qty 50

## 2015-04-15 MED ORDER — FAMOTIDINE 20 MG PO TABS
20.0000 mg | ORAL_TABLET | Freq: Two times a day (BID) | ORAL | Status: DC
  Administered 2015-04-15 – 2015-04-18 (×7): 20 mg via ORAL
  Filled 2015-04-15 (×7): qty 1

## 2015-04-15 MED ORDER — METOCLOPRAMIDE HCL 5 MG/5ML PO SOLN
10.0000 mg | Freq: Two times a day (BID) | ORAL | Status: DC
  Administered 2015-04-15 – 2015-04-18 (×6): 10 mg via ORAL
  Filled 2015-04-15 (×9): qty 10

## 2015-04-15 MED ORDER — INSULIN ASPART 100 UNIT/ML ~~LOC~~ SOLN
0.0000 [IU] | Freq: Three times a day (TID) | SUBCUTANEOUS | Status: DC
  Administered 2015-04-15 (×2): 8 [IU] via SUBCUTANEOUS
  Administered 2015-04-16: 5 [IU] via SUBCUTANEOUS
  Administered 2015-04-16 – 2015-04-17 (×2): 11 [IU] via SUBCUTANEOUS
  Administered 2015-04-17 (×2): 5 [IU] via SUBCUTANEOUS
  Administered 2015-04-18: 8 [IU] via SUBCUTANEOUS
  Filled 2015-04-15 (×2): qty 11
  Filled 2015-04-15 (×2): qty 8
  Filled 2015-04-15 (×3): qty 5
  Filled 2015-04-15: qty 8

## 2015-04-15 NOTE — ED Notes (Addendum)
Pt presents with lower left abdominal pain that she states sometimes radiates to central and right lower abdomen. Pt states pain x3 days. Pt states N&V and diarrhea as well. States "I vomit when I have my bowel movement" Pt states she has been "living off of tomato soup and crackers." Pt just coughed and gave herself a puff of an inhaler.

## 2015-04-15 NOTE — ED Notes (Signed)
Pt returned from CT via stretcher.

## 2015-04-15 NOTE — ED Notes (Signed)
CT informed that pt is done drinking her contrast.

## 2015-04-15 NOTE — Progress Notes (Signed)
Patient admitted to [facility] from the ED for diverticulitis. Alert and oriented,  complaints of pain in lower abdomen.  Fluids and IV antibiotics given.  Up to bathroom frequently, continent of bowel and bladder.  Educated on safety and oriented to room.  Resting in bed at this time. Will continue to monitor.  Haynes Hoehn RN

## 2015-04-15 NOTE — Progress Notes (Addendum)
Inpatient Diabetes Program Recommendations  AACE/ADA: New Consensus Statement on Inpatient Glycemic Control (2015)  Target Ranges:  Prepandial:   less than 140 mg/dL      Peak postprandial:   less than 180 mg/dL (1-2 hours)      Critically ill patients:  140 - 180 mg/dL   Review of Glycemic Control  Results for Gail Stevenson, Gail Stevenson (MRN [id number]) as of 04/15/2015 13:36  Ref. Range 04/15/2015 12:44  Glucose-Capillary Latest Ref Range: 65-99 mg/dL 299 (H)    Diabetes history: Type 2, awaiting A1C Outpatient Diabetes medications: Metformin 500mg  2-3 tabs 2x/day, Januvia 100mg /day, Glipizide 10mg  bid Current orders for Inpatient glycemic control: Novolog 0-5 units qhs, Novolog 0-15 units tid  Inpatient Diabetes Program Recommendations: Please consider ordering low dose basal insulin Lantus 8 units qhs. Agree with current orders for Novolog correction insulin.  Consider ordering carb modified diet   Gentry Fitz, RN, [place], [place], CDE Diabetes Coordinator Inpatient Diabetes Program  [phone] (Team Pager) [phone] ([place]) 04/15/2015 1:38 PM

## 2015-04-15 NOTE — H&P (Signed)
Gail Stevenson is an 60 y.o. female.    Chief Complaint: Left lower quadrant abdominal pain  HPI: This a patient with a first episode of left lower quadrant abdominal pain that started on Tuesday 4 days ago and has been gradually worsening she's had nausea and vomiting last vomiting before coming to the emergency room today she's had no fevers or chills has never had an episode like this before has had very loose stools but no melena or hematochezia and points to the left lower quadrant as a side of her pain.  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 She also has hypertension and hypercholesterolemia.  Past Medical History  Diagnosis Date  . 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 Asthma   . Diabetes mellitus without complication ([place])   . Hypertension     Past Surgical History  Procedure Laterality Date  . Mastectomy Right 2003    total  . Breast surgery Right 2003    total mastectomy    No family history on file. Social History:  reports that she has quit smoking. She does not have any smokeless tobacco history on file. She reports that she does not drink alcohol or use illicit drugs.  Allergies:  Allergies  Allergen Reactions  . Patanol [Olopatadine]      (Not in a hospital admission)   Review of Systems  Constitutional: Negative for fever, chills and weight loss.  HENT: Negative.   Eyes: Negative.   Respiratory: Negative.   Cardiovascular: Negative.   Gastrointestinal: Positive for nausea, vomiting, abdominal pain and diarrhea. Negative for heartburn, constipation, blood in stool and melena.  Genitourinary: Negative.   Musculoskeletal: Negative.   Skin: Negative.   Neurological: Negative.   Endo/Heme/Allergies: Negative.   Psychiatric/Behavioral:  Negative.      Physical Exam:  BP 129/67 mmHg  Pulse 106  Temp(Src) 98.6 F (37 C) (Oral)  Resp 20  Ht 5' (1.524 m)  Wt 184 lb (83.462 kg)  BMI 35.94 kg/m2  SpO2 98%  Physical Exam  Constitutional: She is oriented to person, place, and time and well-developed, well-nourished, and in no distress. No distress.  Morbidly obese with a BMI of 36 but appears comfortable  HENT:  Head: Normocephalic and atraumatic.  Eyes: Pupils are equal, round, and reactive to light. Right eye exhibits no discharge. Left eye exhibits no discharge. No scleral icterus.  Neck: Normal range of motion. Neck supple.  Cardiovascular: Normal rate, regular rhythm and normal heart sounds.   Pulmonary/Chest: Effort normal and breath sounds normal. No respiratory distress. She has no wheezes. She has no rales.  Abdominal: Soft. She exhibits no distension. There is tenderness. There is guarding. There is no rebound.  Infraumbilical midline scar well healed without hernia abdomen is tender in the left lower quadrant with guarding but no rebound or percussion tenderness  Musculoskeletal: Normal range of motion. She exhibits no edema or tenderness.  Lymphadenopathy:    She has no cervical adenopathy.  Neurological: She is alert and oriented to person, place, and time.  Skin: Skin is warm and dry. No rash noted. She is not diaphoretic. No erythema.  Psychiatric: Mood and affect normal.  Vitals reviewed.       Results for orders placed or performed  during the hospital encounter of 04/15/15 (from the past 48 hour(s))  Lipase, blood     Status: None   Collection Time: 04/14/15 11:48 PM  Result Value Ref Range   Lipase 27 11 - 51 U/L  Comprehensive metabolic panel     Status: Abnormal   Collection Time: 04/14/15 11:48 PM  Result Value Ref Range   Sodium 133 (L) 135 - 145 mmol/L   Potassium 3.7 3.5 - 5.1 mmol/L   Chloride 98 (L) 101 - 111 mmol/L   CO2 25 22 - 32 mmol/L   Glucose, Bld 368 (H) 65 - 99 mg/dL    BUN 16 6 - 20 mg/dL   Creatinine, Ser 1.01 (H) 0.44 - 1.00 mg/dL   Calcium 8.8 (L) 8.9 - 10.3 mg/dL   Total Protein 7.7 6.5 - 8.1 g/dL   Albumin 3.2 (L) 3.5 - 5.0 g/dL   AST 31 15 - 41 U/L   ALT 30 14 - 54 U/L   Alkaline Phosphatase 61 38 - 126 U/L   Total Bilirubin 0.4 0.3 - 1.2 mg/dL   GFR calc non Af Amer 59 (L) >60 mL/min   GFR calc Af Amer >60 >60 mL/min    Comment: (NOTE) The eGFR has been calculated using the CKD EPI equation. This calculation has not been validated in all clinical situations. eGFR's persistently <60 mL/min signify possible Chronic Kidney Disease.    Anion gap 10 5 - 15  CBC     Status: Abnormal   Collection Time: 04/14/15 11:48 PM  Result Value Ref Range   WBC 25.4 (H) 3.6 - 11.0 K/uL   RBC 4.12 3.80 - 5.20 MIL/uL   Hemoglobin 12.2 12.0 - 16.0 g/dL   HCT 36.0 35.0 - 47.0 %   MCV 87.4 80.0 - 100.0 fL   MCH 29.5 26.0 - 34.0 pg   MCHC 33.8 32.0 - 36.0 g/dL   RDW 12.4 11.5 - 14.5 %   Platelets 354 150 - 440 K/uL  Urinalysis complete, with microscopic ([facility] only)     Status: Abnormal   Collection Time: 04/14/15 11:48 PM  Result Value Ref Range   Color, Urine YELLOW (A) YELLOW   APPearance CLEAR (A) CLEAR   Glucose, UA >500 (A) NEGATIVE mg/dL   Bilirubin Urine NEGATIVE NEGATIVE   Ketones, ur NEGATIVE NEGATIVE mg/dL   Specific Gravity, Urine 1.031 (H) 1.005 - 1.030   Hgb urine dipstick 1+ (A) NEGATIVE   pH 6.0 5.0 - 8.0   Protein, ur NEGATIVE NEGATIVE mg/dL   Nitrite NEGATIVE NEGATIVE   Leukocytes, UA NEGATIVE NEGATIVE   RBC / HPF 0-5 0 - 5 RBC/hpf   WBC, UA 6-30 0 - 5 WBC/hpf   Bacteria, UA NONE SEEN NONE SEEN   Squamous Epithelial / LPF 0-5 (A) NONE SEEN   Mucous PRESENT    Ct Abdomen Pelvis W Contrast  04/15/2015  CLINICAL DATA:  Acute onset of left lower quadrant abdominal pain, radiating to the right lower quadrant. Nausea, vomiting and diarrhea. Initial encounter. EXAM: CT ABDOMEN AND PELVIS WITH CONTRAST TECHNIQUE: Multidetector CT imaging of  the abdomen and pelvis was performed using the standard protocol following bolus administration of intravenous contrast. CONTRAST:  171m OMNIPAQUE IOHEXOL 350 MG/ML SOLN COMPARISON:  CT of the abdomen and pelvis performed 12/17/2006, and abdominal ultrasound performed 12/18/2006 FINDINGS: Mild right basilar opacity may reflect atelectasis or mild pneumonia. The patient is status post right-sided mastectomy. The liver and spleen are unremarkable in appearance. The gallbladder is  within normal limits. The pancreas and adrenal glands are unremarkable. The kidneys are unremarkable in appearance. There is no evidence of hydronephrosis. No renal or ureteral stones are seen. Mild nonspecific perinephric stranding is noted bilaterally. The small bowel is unremarkable in appearance. The stomach is within normal limits. No acute vascular abnormalities are seen. Mild scattered calcification is noted along the abdominal aorta and its branches. The appendix is normal in caliber, without evidence of appendicitis. Focal soft tissue inflammation is noted about the distal descending and proximal sigmoid colon, with trace associated free fluid, compatible with acute diverticulitis. Minimal free air is noted within the overlying mesentery, reflecting microperforation. Scattered diverticulosis is noted along the descending and proximal sigmoid colon. There is mild soft tissue inflammation along the left mid ureter, reflecting the adjacent diverticulitis. The bladder is mildly distended and grossly unremarkable. The patient is status post hysterectomy. No suspicious adnexal masses are seen. No inguinal lymphadenopathy is seen. No acute osseous abnormalities are identified. IMPRESSION: 1. Acute diverticulitis at the distal descending and proximal sigmoid colon, with focal soft tissue inflammation and trace associated free fluid. Minimal overlying free air noted within the mesentery, reflecting microperforation. 2. Mild soft tissue  inflammation along the left mid ureter reflects the adjacent diverticulitis. 3. Mild right basilar airspace opacity may reflect atelectasis or mild pneumonia. 4. Scattered diverticulosis along the descending and proximal sigmoid colon. 5. Mild scattered calcification along the abdominal aorta and its branches. These results were called by telephone at the time of interpretation on 04/15/2015 at 4:42 am to Dr. Delman Kitten, who verbally acknowledged these results. Electronically Signed   By: Garald Balding M.D.   On: 04/15/2015 04:44     Assessment/Plan  CT scan is personally reviewed showing signs of microperforation and stranding in the left lower quadrant and pelvis suggestive of acute diverticulitis. Recommend admission to the hospital hydration and institution of IV antibiotics which is arty been performed in the emergency room. The rationale for this is been discussed the options of observation have been reviewed and the potential for emergency surgery including a colostomy has been discussed she understood and agreed to proceed proximal to 80 minutes are spent in direct patient care  Florene Glen, MD, FACS

## 2015-04-15 NOTE — ED Notes (Signed)
Remains to wait on floor bed

## 2015-04-15 NOTE — ED Provider Notes (Signed)
[facility] Emergency Department Provider Note  ____________________________________________  Time seen: Approximately 4:52 AM  I have reviewed the triage vital signs and the nursing notes.   HISTORY  Chief Complaint Abdominal Pain    HPI Gail Stevenson is a 60 y.o. female sensor elevation of abdominal pain since about Tuesday. She is having pain primarily on the left side of the abdomen, reports is achy and she thought for she was constipated but then the last couple days started having loose stools and significant moderate to severe crampy pain over the left abdomen. Started vomiting today. Denies any blood in her stool. No chest pain or jaw breathing. No fever area she has been feeling fatigued and not eating as much recently.   Past Medical History  Diagnosis Date  . Cancer [facility]) 2003    right side total mastectomy  . Asthma   . Diabetes mellitus without complication ([place])   . Hypertension     There are no active problems to display for this patient.   Past Surgical History  Procedure Laterality Date  . Mastectomy Right 2003    total  . Breast surgery Right 2003    total mastectomy    Current Outpatient Rx  Name  Route  Sig  Dispense  Refill  . benzonatate (TESSALON PERLES) 100 MG capsule   Oral   Take 1 capsule (100 mg total) by mouth 3 (three) times daily as needed for cough.   30 capsule   0   . carbamide peroxide (DEBROX) 6.5 % otic solution   Both Ears   Place 5 drops into both ears 2 (two) times daily. For 3 days   15 mL   2   . potassium chloride (K-DUR) 10 MEQ tablet   Oral   Take 1 tablet (10 mEq total) by mouth daily.   7 tablet   0   . traMADol (ULTRAM) 50 MG tablet   Oral   Take 1 tablet (50 mg total) by mouth every 6 (six) hours as needed.   20 tablet   0   . traMADol (ULTRAM) 50 MG tablet   Oral   Take 1 tablet (50 mg total) by mouth every 6 (six) hours as needed.   20 tablet   0      Allergies Patanol  No family history on file.  Social History Social History  Substance Use Topics  . Smoking status: Former Research scientist (life sciences)  . Smokeless tobacco: None  . Alcohol Use: No    Review of Systems Constitutional: No fever/chills Eyes: No visual changes. ENT: No sore throat. Cardiovascular: Denies chest pain. Respiratory: Denies shortness of breath. Gastrointestinal: See history of present illness Genitourinary: Negative for dysuria. Musculoskeletal: Negative for back pain. Skin: Negative for rash. Neurological: Negative for headaches, focal weakness or numbness.  10-point ROS otherwise negative.  ____________________________________________   PHYSICAL EXAM:  VITAL SIGNS: ED Triage Vitals  Enc Vitals Group     BP 04/14/15 2345 134/39 mmHg     Pulse Rate 04/14/15 2345 112     Resp 04/14/15 2345 20     Temp 04/14/15 2345 98.6 F (37 C)     Temp Source 04/14/15 2345 Oral     SpO2 04/14/15 2345 97 %     Weight 04/14/15 2345 184 lb (83.462 kg)     Height 04/14/15 2345 5' (1.524 m)     Head Cir --      Peak Flow --      Pain  Score 04/14/15 2346 10     Pain Loc --      Pain Edu? --      Excl. in [place]? --    Constitutional: Alert and oriented. Well appearing and in no acute distress. Eyes: Conjunctivae are normal. PERRL. EOMI. Head: Atraumatic. Nose: No congestion/rhinnorhea. Mouth/Throat: Mucous membranes are moist.  Oropharynx non-erythematous. Neck: No stridor.   Cardiovascular: Normal rate, regular rhythm. Grossly normal heart sounds.  Good peripheral circulation. Respiratory: Normal respiratory effort.  No retractions. Lungs CTAB. Gastrointestinal: Soft but moderately tender with rebound and peritoneal symptoms over the left and epigastric region. Mild tenderness the right lower quadrant seems referred pain to the left. Musculoskeletal: No lower extremity tenderness nor edema.  No joint effusions. Neurologic:  Normal speech and language. No gross focal  neurologic deficits are appreciated. Skin:  Skin is warm, dry and intact. No rash noted. Psychiatric: Mood and affect are normal. Speech and behavior are normal.  ____________________________________________   LABS (all labs ordered are listed, but only abnormal results are displayed)  Labs Reviewed  COMPREHENSIVE METABOLIC PANEL - Abnormal; Notable for the following:    Sodium 133 (*)    Chloride 98 (*)    Glucose, Bld 368 (*)    Creatinine, Ser 1.01 (*)    Calcium 8.8 (*)    Albumin 3.2 (*)    GFR calc non Af Amer 59 (*)    All other components within normal limits  CBC - Abnormal; Notable for the following:    WBC 25.4 (*)    All other components within normal limits  URINALYSIS COMPLETEWITH MICROSCOPIC ([facility] ONLY) - Abnormal; Notable for the following:    Color, Urine YELLOW (*)    APPearance CLEAR (*)    Glucose, UA >500 (*)    Specific Gravity, Urine 1.031 (*)    Hgb urine dipstick 1+ (*)    Squamous Epithelial / LPF 0-5 (*)    All other components within normal limits  CULTURE, BLOOD (ROUTINE X 2)  CULTURE, BLOOD (ROUTINE X 2)  LIPASE, BLOOD  LACTIC ACID, PLASMA  LACTIC ACID, PLASMA   ____________________________________________  EKG   ____________________________________________  RADIOLOGY   CT Abdomen Pelvis W Contrast (Final result) Result time: 04/15/15 04:44:26   Final result by Rad Results In Interface (04/15/15 04:44:26)   Narrative:   CLINICAL DATA: Acute onset of left lower quadrant abdominal pain, radiating to the right lower quadrant. Nausea, vomiting and diarrhea. Initial encounter.  EXAM: CT ABDOMEN AND PELVIS WITH CONTRAST  TECHNIQUE: Multidetector CT imaging of the abdomen and pelvis was performed using the standard protocol following bolus administration of intravenous contrast.  CONTRAST: 134mL OMNIPAQUE IOHEXOL 350 MG/ML SOLN  COMPARISON: CT of the abdomen and pelvis performed 12/17/2006, and abdominal ultrasound performed  12/18/2006  FINDINGS: Mild right basilar opacity may reflect atelectasis or mild pneumonia. The patient is status post right-sided mastectomy.  The liver and spleen are unremarkable in appearance. The gallbladder is within normal limits. The pancreas and adrenal glands are unremarkable.  The kidneys are unremarkable in appearance. There is no evidence of hydronephrosis. No renal or ureteral stones are seen. Mild nonspecific perinephric stranding is noted bilaterally.  The small bowel is unremarkable in appearance. The stomach is within normal limits. No acute vascular abnormalities are seen. Mild scattered calcification is noted along the abdominal aorta and its branches.  The appendix is normal in caliber, without evidence of appendicitis. Focal soft tissue inflammation is noted about the distal descending and proximal sigmoid  colon, with trace associated free fluid, compatible with acute diverticulitis. Minimal free air is noted within the overlying mesentery, reflecting microperforation. Scattered diverticulosis is noted along the descending and proximal sigmoid colon.  There is mild soft tissue inflammation along the left mid ureter, reflecting the adjacent diverticulitis.  The bladder is mildly distended and grossly unremarkable. The patient is status post hysterectomy. No suspicious adnexal masses are seen. No inguinal lymphadenopathy is seen.  No acute osseous abnormalities are identified.  IMPRESSION: 1. Acute diverticulitis at the distal descending and proximal sigmoid colon, with focal soft tissue inflammation and trace associated free fluid. Minimal overlying free air noted within the mesentery, reflecting microperforation. 2. Mild soft tissue inflammation along the left mid ureter reflects the adjacent diverticulitis. 3. Mild right basilar airspace opacity may reflect atelectasis or mild pneumonia. 4. Scattered diverticulosis along the descending and  proximal sigmoid colon. 5. Mild scattered calcification along the abdominal aorta and its branches. These results were called by telephone at the time of interpretation on 04/15/2015 at 4:42 am to Dr. Delman Kitten, who verbally acknowledged these results.   Electronically Signed By: Garald Balding M.D. On: 04/15/2015 04:44          ____________________________________________   PROCEDURES  Procedure(s) performed: None  Critical Care performed: Yes, see critical care note(s)  CRITICAL CARE Performed by: Delman Kitten   Total critical care time: 35 minutes  Critical care time was exclusive of separately billable procedures and treating other patients.  Critical care was necessary to treat or prevent imminent or life-threatening deterioration.  Critical care was time spent personally by me on the following activities: development of treatment plan with patient and/or surrogate as well as nursing, discussions with consultants, evaluation of patient's response to treatment, examination of patient, obtaining history from patient or surrogate, ordering and performing treatments and interventions, ordering and review of laboratory studies, ordering and review of radiographic studies, pulse oximetry and re-evaluation of patient's condition.  Patient noted to have diverticulitis with associated perforation. Required surgical consultation, IV antibiotics, reassessment, pain control. ____________________________________________   INITIAL IMPRESSION / ASSESSMENT AND PLAN / ED COURSE  Pertinent labs & imaging results that were available during my care of the patient were reviewed by me and considered in my medical decision making (see chart for details).  Patient notes abdominal pain, recent loose diarrhea and vomiting. Abdominal exam concerning for peritonitis. She is not febrile, but she does have notable tenderness. Obtain CT imaging, labs and further evaluation. I am concerned  about the possibility of perforation, diverticulitis, or other acute intra-abdominal process or catastrophe. No card and pulmonary symptoms.  ----------------------------------------- 4:57 AM on 04/15/2015 -----------------------------------------  Case and care discussed with Dr. Burt Knack who will evaluate the patient, initial conversation as he is planning to admit for acute diverticulitis. The patient is noted to have potential perforation by CT, and this was discussed with Dr. Burt Knack as well. The patient is awake alert in no distress this time but does report her pain is trying to come back. I will give her additional dose of morphine at this time as she continues to have peritoneal signs over the left lower quadrant and upper abdomen. The patient does meet sepsis criteria. Chest x-ray additionally shows questionable pneumonia, however the patient denies any respiratory symptoms and I suspect likely presentation is due to acute diverticulitis. Zosyn ordered. ____________________________________________   FINAL CLINICAL IMPRESSION(S) / ED DIAGNOSES  Final diagnoses:  Diverticulitis of colon with perforation  Sepsis, due to unspecified organism ([place])  Delman Kitten, MD 04/15/15 [phone]

## 2015-04-15 NOTE — ED Notes (Addendum)
Pt states she takes Metformin for her Diabetes, 3 pills (does not know the dosage) 11am and 2 pills at 8p. Pt is calling home to have family bring medicines to hospital to get home medicines clarified because pt does not know dosages or names of name medicines.

## 2015-04-15 NOTE — Consult Note (Signed)
[place] [facility] [place] NAME: Gail Stevenson    MR#:  [id number]  DATE OF BIRTH:  07-10-1955  DATE OF ADMISSION:  04/15/2015  PRIMARY CARE PHYSICIAN: Pcp Not In System   CONSULT REQUESTING/REFERRING PHYSICIAN: Dr. Burt Knack  REASON FOR CONSULT: DM, HTN  CHIEF COMPLAINT:   Chief Complaint  Patient presents with  . Abdominal Pain    HISTORY OF PRESENT ILLNESS:  Gail Stevenson  is a 60 y.o. female with a known history of diabetes, hypertension, COPD presented to the emergency room with slowly worsening nausea and vomiting, diarrhea and left lower quadrant abdominal pain. Patient was found to have acute sigmoid diverticulitis with perforation. Admitted to the surgical service on IV antibiotics and possible surgery depending on her progress. Presently she is on clear liquid diet. Patient mentions her blood sugars tend to run between 100-200 home. Does not check her blood pressure at home. Not on insulin. She also has chronic hepatitis C. Not on treatment. All her care until now has been mainly at [facility]. Presently her main concern is pain in the abdomen along with nausea. No blood noticed in the vomiting or stool. No recent antibiotic use.  PAST MEDICAL HISTORY:   Past Medical History  Diagnosis Date  . Cancer [facility]) 2003    right side total mastectomy  . Asthma   . Diabetes mellitus without complication ([place])   . Hypertension   . Hepatitis C   . Trigeminal neuralgia   . Diastolic dysfunction     PAST SURGICAL HISTOIRY:   Past Surgical History  Procedure Laterality Date  . Mastectomy Right 2003    total  . Breast surgery Right 2003    total mastectomy    SOCIAL HISTORY:   Social History  Substance Use Topics  . Smoking status: Former Research scientist (life sciences)  . Smokeless tobacco: Not on file  . Alcohol Use: No    FAMILY HISTORY:   Family History  Problem Relation Age of Onset  . Gastric cancer Father   . Breast cancer  Maternal Aunt     DRUG ALLERGIES:   Allergies  Allergen Reactions  . Patanol [Olopatadine]     REVIEW OF SYSTEMS:   Review of Systems  Constitutional: Positive for malaise/fatigue. Negative for fever, chills and weight loss.  HENT: Negative for hearing loss and nosebleeds.   Eyes: Negative for blurred vision, double vision and pain.  Respiratory: Positive for cough. Negative for hemoptysis, sputum production, shortness of breath and wheezing.   Cardiovascular: Negative for chest pain, palpitations, orthopnea and leg swelling.  Gastrointestinal: Positive for nausea, vomiting, abdominal pain and diarrhea. Negative for constipation.  Genitourinary: Negative for dysuria and hematuria.  Musculoskeletal: Negative for myalgias, back pain and falls.  Skin: Negative for rash.  Neurological: Positive for weakness. Negative for dizziness, tremors, sensory change, speech change, focal weakness, seizures and headaches.  Endo/Heme/Allergies: Does not bruise/bleed easily.  Psychiatric/Behavioral: Negative for depression and memory loss. The patient is not nervous/anxious.     CONSTITUTIONAL: No fever, fatigue or weakness.  EYES: No blurred or double vision.  EARS, NOSE, AND THROAT: No tinnitus or ear pain.  RESPIRATORY: No cough, shortness of breath, wheezing or hemoptysis.  CARDIOVASCULAR: No chest pain, orthopnea, edema.  GASTROINTESTINAL: Has nausea, vomiting, diarrhea or abdominal pain.  GENITOURINARY: No dysuria, hematuria.  ENDOCRINE: No polyuria, nocturia,  HEMATOLOGY: No anemia, easy bruising or bleeding SKIN: No rash or lesion. MUSCULOSKELETAL: No joint pain or arthritis.  NEUROLOGIC: No tingling, numbness, weakness.  PSYCHIATRY: No anxiety or depression.   MEDICATIONS AT HOME:   Prior to Admission medications   Medication Sig Start Date End Date Taking? Authorizing Provider  amLODipine (NORVASC) 10 MG tablet Take 10 mg by mouth daily.   Yes Historical Provider, MD   beclomethasone (QVAR) 80 MCG/ACT inhaler Inhale into the lungs 4 (four) times daily as needed.   Yes Historical Provider, MD  glipiZIDE (GLUCOTROL) 10 MG tablet Take 10 mg by mouth 2 (two) times daily before a meal.   Yes Historical Provider, MD  hydrochlorothiazide (HYDRODIURIL) 25 MG tablet Take 25 mg by mouth daily.   Yes Historical Provider, MD  lovastatin (MEVACOR) 40 MG tablet Take 40 mg by mouth at bedtime.   Yes Historical Provider, MD  ramipril (ALTACE) 10 MG capsule Take 10 mg by mouth 2 (two) times daily.   Yes Historical Provider, MD  ranitidine (ZANTAC) 150 MG tablet Take 150 mg by mouth 2 (two) times daily.   Yes Historical Provider, MD  sitaGLIPtin (JANUVIA) 100 MG tablet Take 100 mg by mouth daily.   Yes Historical Provider, MD  benzonatate (TESSALON PERLES) 100 MG capsule Take 1 capsule (100 mg total) by mouth 3 (three) times daily as needed for cough. 07/18/14 07/18/15  Johnn Hai, PA-C  carbamide peroxide (DEBROX) 6.5 % otic solution Place 5 drops into both ears 2 (two) times daily. For 3 days 07/18/14 07/18/15  Johnn Hai, PA-C  metFORMIN (GLUCOPHAGE) 500 MG tablet Take 2-3 tablets by mouth 2 (two) times daily. 03/13/15   Historical Provider, MD  potassium chloride (K-DUR) 10 MEQ tablet Take 1 tablet (10 mEq total) by mouth daily. 07/18/14   Johnn Hai, PA-C  PROAIR HFA 108 [id number] Base) MCG/ACT inhaler Inhale 2 puffs into the lungs 4 (four) times daily as needed. 03/13/15   Historical Provider, MD  traMADol (ULTRAM) 50 MG tablet Take 1 tablet (50 mg total) by mouth every 6 (six) hours as needed. 12/05/14   Daymon Larsen, MD  traMADol (ULTRAM) 50 MG tablet Take 1 tablet (50 mg total) by mouth every 6 (six) hours as needed. 12/05/14   Daymon Larsen, MD      VITAL SIGNS:  Blood pressure 140/67, pulse 106, temperature 97.8 F (36.6 C), temperature source Oral, resp. rate 17, height 5' (1.524 m), weight 83.462 kg (184 lb), SpO2 93 %.  PHYSICAL EXAMINATION:  GENERAL:  60  y.o.-year-old patient lying in the bed with no acute distress. Obese EYES: Pupils equal, round, reactive to light and accommodation. No scleral icterus. Extraocular muscles intact.  HEENT: Head atraumatic, normocephalic. Oropharynx and nasopharynx clear.  NECK:  Supple, no jugular venous distention. No thyroid enlargement, no tenderness.  LUNGS: Normal breath sounds bilaterally, no wheezing, rales,rhonchi or crepitation. No use of accessory muscles of respiration.  CARDIOVASCULAR: S1, S2 normal. No murmurs, rubs, or gallops.  ABDOMEN: Soft, nondistended. Bowel sounds present. No organomegaly or mass. Tenderness in the left lower quadrant EXTREMITIES: No pedal edema, cyanosis, or clubbing.  NEUROLOGIC: Cranial nerves II through XII are intact. Muscle strength 5/5 in all extremities. Sensation intact. Gait not checked.  PSYCHIATRIC: The patient is alert and oriented x 3.  SKIN: No obvious rash, lesion, or ulcer.   LABORATORY PANEL:   CBC  Recent Labs Lab 04/14/15 2348  WBC 25.4*  HGB 12.2  HCT 36.0  PLT 354   ------------------------------------------------------------------------------------------------------------------  Chemistries   Recent Labs Lab 04/14/15 2348  NA 133*  K 3.7  CL 98*  CO2 25  GLUCOSE 368*  BUN 16  CREATININE 1.01*  CALCIUM 8.8*  AST 31  ALT 30  ALKPHOS 61  BILITOT 0.4   ------------------------------------------------------------------------------------------------------------------  Cardiac Enzymes No results for input(s): TROPONINI in the last 168 hours. ------------------------------------------------------------------------------------------------------------------  RADIOLOGY:  Ct Abdomen Pelvis W Contrast  04/15/2015  CLINICAL DATA:  Acute onset of left lower quadrant abdominal pain, radiating to the right lower quadrant. Nausea, vomiting and diarrhea. Initial encounter. EXAM: CT ABDOMEN AND PELVIS WITH CONTRAST TECHNIQUE: Multidetector CT  imaging of the abdomen and pelvis was performed using the standard protocol following bolus administration of intravenous contrast. CONTRAST:  153mL OMNIPAQUE IOHEXOL 350 MG/ML SOLN COMPARISON:  CT of the abdomen and pelvis performed 12/17/2006, and abdominal ultrasound performed 12/18/2006 FINDINGS: Mild right basilar opacity may reflect atelectasis or mild pneumonia. The patient is status post right-sided mastectomy. The liver and spleen are unremarkable in appearance. The gallbladder is within normal limits. The pancreas and adrenal glands are unremarkable. The kidneys are unremarkable in appearance. There is no evidence of hydronephrosis. No renal or ureteral stones are seen. Mild nonspecific perinephric stranding is noted bilaterally. The small bowel is unremarkable in appearance. The stomach is within normal limits. No acute vascular abnormalities are seen. Mild scattered calcification is noted along the abdominal aorta and its branches. The appendix is normal in caliber, without evidence of appendicitis. Focal soft tissue inflammation is noted about the distal descending and proximal sigmoid colon, with trace associated free fluid, compatible with acute diverticulitis. Minimal free air is noted within the overlying mesentery, reflecting microperforation. Scattered diverticulosis is noted along the descending and proximal sigmoid colon. There is mild soft tissue inflammation along the left mid ureter, reflecting the adjacent diverticulitis. The bladder is mildly distended and grossly unremarkable. The patient is status post hysterectomy. No suspicious adnexal masses are seen. No inguinal lymphadenopathy is seen. No acute osseous abnormalities are identified. IMPRESSION: 1. Acute diverticulitis at the distal descending and proximal sigmoid colon, with focal soft tissue inflammation and trace associated free fluid. Minimal overlying free air noted within the mesentery, reflecting microperforation. 2. Mild soft  tissue inflammation along the left mid ureter reflects the adjacent diverticulitis. 3. Mild right basilar airspace opacity may reflect atelectasis or mild pneumonia. 4. Scattered diverticulosis along the descending and proximal sigmoid colon. 5. Mild scattered calcification along the abdominal aorta and its branches. These results were called by telephone at the time of interpretation on 04/15/2015 at 4:42 am to Dr. Delman Kitten, who verbally acknowledged these results. Electronically Signed   By: Garald Balding M.D.   On: 04/15/2015 04:44    EKG:   Orders placed or performed during the hospital encounter of 12/05/14  . ED EKG  . ED EKG    IMPRESSION AND PLAN:   * Acute diverticulitis with microperforation and sepsis She is on IV antibiotics and IV fluids. Clear liquid diet.  * Diabetes mellitus type 2 Hold her oral hypoglycemics. Patient is on clear liquids. We will add sliding scale insulin.  * Hypertension Continue on her hydrochlorothiazide. Held Norvasc and ramipril. Add labetalol IV when necessary.  * Mild hyponatremia, asymptomatic. Likely from dehydration.  * DVT prophylaxis with SCDs  All the records are reviewed and case discussed with Consulting provider. Management plans discussed with the patient, family and they are in agreement.  CODE STATUS: FULL  TOTAL TIME TAKING CARE OF THIS PATIENT: 40 minutes.    Hillary Bow R M.D on 04/15/2015 at  11:04 AM  Between 7am to 6pm - Pager - [phone]  After 6pm go to [email or web address] - password [email or web address] [facility]  Office  [phone]  CC: Primary care Physician: Pcp Not In System     Note: This dictation was prepared with Dragon dictation along with smaller phrase technology. Any transcriptional errors that result from this process are unintentional.

## 2015-04-16 LAB — BASIC METABOLIC PANEL
Anion gap: 9 (ref 5–15)
BUN: 6 mg/dL (ref 6–20)
CHLORIDE: 100 mmol/L — AB (ref 101–111)
CO2: 30 mmol/L (ref 22–32)
CREATININE: 0.8 mg/dL (ref 0.44–1.00)
Calcium: 8.6 mg/dL — ABNORMAL LOW (ref 8.9–10.3)
GFR calc non Af Amer: >60 mL/min (ref >60)
GLUCOSE: 236 mg/dL — AB (ref 65–99)
Potassium: 3.7 mmol/L (ref 3.5–5.1)
Sodium: 139 mmol/L (ref 135–145)

## 2015-04-16 LAB — GLUCOSE, CAPILLARY
GLUCOSE-CAPILLARY: 327 mg/dL — AB (ref 65–99)
GLUCOSE-CAPILLARY: 104 mg/dL — AB (ref 65–99)
Glucose-Capillary: 166 mg/dL — ABNORMAL HIGH (ref 65–99)
Glucose-Capillary: 250 mg/dL — ABNORMAL HIGH (ref 65–99)
Glucose-Capillary: 179 mg/dL — ABNORMAL HIGH (ref 65–99)

## 2015-04-16 LAB — CBC
HCT: 35 % (ref 35.0–47.0)
HEMOGLOBIN: 11.9 g/dL — AB (ref 12.0–16.0)
MCH: 30.4 pg (ref 26.0–34.0)
MCHC: 34 g/dL (ref 32.0–36.0)
MCV: 89.4 fL (ref 80.0–100.0)
PLATELETS: 366 10*3/uL (ref 150–440)
RBC: 3.92 MIL/uL (ref 3.80–5.20)
RDW: 12.4 % (ref 11.5–14.5)
WBC: 17.3 10*3/uL — ABNORMAL HIGH (ref 3.6–11.0)

## 2015-04-16 MED ORDER — RAMIPRIL 10 MG PO CAPS
10.0000 mg | ORAL_CAPSULE | Freq: Two times a day (BID) | ORAL | Status: DC
  Administered 2015-04-16 – 2015-04-18 (×4): 10 mg via ORAL
  Filled 2015-04-16 (×4): qty 1

## 2015-04-16 MED ORDER — INSULIN GLARGINE 100 UNIT/ML ~~LOC~~ SOLN
8.0000 [IU] | Freq: Every day | SUBCUTANEOUS | Status: DC
  Administered 2015-04-16 – 2015-04-18 (×3): 8 [IU] via SUBCUTANEOUS
  Filled 2015-04-16 (×5): qty 0.08

## 2015-04-16 NOTE — Progress Notes (Signed)
AVSS. Marked decrease in pain. Has ambulated in the room. Lungs: Clear. Cardio: RR. ABD: Moderate distension, BS prominent. No rushes. Minimal LLQ tenderness. Labs: WBC trending down.  Tolerating liquids. Plan: Ambulate.

## 2015-04-16 NOTE — Progress Notes (Signed)
[place] at [place] NAME: Gail Stevenson    MR#:  [id number]  DATE OF BIRTH:  1955-04-23  SUBJECTIVE:  CHIEF COMPLAINT:   Chief Complaint  Patient presents with  . Abdominal Pain   Abd pain and nausea better. Afebrile  REVIEW OF SYSTEMS:    Review of Systems  Constitutional: Negative for fever and chills.  HENT: Negative for sore throat.   Eyes: Negative for blurred vision, double vision and pain.  Respiratory: Negative for cough, hemoptysis, shortness of breath and wheezing.   Cardiovascular: Negative for chest pain, palpitations, orthopnea and leg swelling.  Gastrointestinal: Positive for nausea and abdominal pain. Negative for heartburn, vomiting, diarrhea and constipation.  Genitourinary: Negative for dysuria and hematuria.  Musculoskeletal: Negative for back pain and joint pain.  Skin: Negative for rash.  Neurological: Negative for sensory change, speech change, focal weakness and headaches.  Endo/Heme/Allergies: Does not bruise/bleed easily.  Psychiatric/Behavioral: Negative for depression. The patient is not nervous/anxious.     DRUG ALLERGIES:   Allergies  Allergen Reactions  . Patanol [Olopatadine]     VITALS:  Blood pressure 155/78, pulse 93, temperature 98.3 F (36.8 C), temperature source Oral, resp. rate 20, height 5' (1.524 m), weight 83.462 kg (184 lb), SpO2 96 %.  PHYSICAL EXAMINATION:   Physical Exam  GENERAL:  60 y.o.-year-old patient lying in the bed with no acute distress. Obese EYES: Pupils equal, round, reactive to light and accommodation. No scleral icterus. Extraocular muscles intact.  HEENT: Head atraumatic, normocephalic. Oropharynx and nasopharynx clear.  NECK:  Supple, no jugular venous distention. No thyroid enlargement, no tenderness.  LUNGS: Normal breath sounds bilaterally, no wheezing, rales, rhonchi. No use of accessory muscles of respiration.  CARDIOVASCULAR: S1, S2 normal. No  murmurs, rubs, or gallops.  ABDOMEN: Soft, nondistended. Bowel sounds present. No organomegaly or mass. Tender LLQ EXTREMITIES: No cyanosis, clubbing or edema b/l.    NEUROLOGIC: Cranial nerves II through XII are intact. No focal Motor or sensory deficits b/l.   PSYCHIATRIC: The patient is alert and oriented x 3.  SKIN: No obvious rash, lesion, or ulcer.   LABORATORY PANEL:   CBC  Recent Labs Lab 04/16/15 0522  WBC 17.3*  HGB 11.9*  HCT 35.0  PLT 366   ------------------------------------------------------------------------------------------------------------------ Chemistries   Recent Labs Lab 04/14/15 2348 04/16/15 0522  NA 133* 139  K 3.7 3.7  CL 98* 100*  CO2 25 30  GLUCOSE 368* 236*  BUN 16 6  CREATININE 1.01* 0.80  CALCIUM 8.8* 8.6*  AST 31  --   ALT 30  --   ALKPHOS 61  --   BILITOT 0.4  --    ------------------------------------------------------------------------------------------------------------------  Cardiac Enzymes No results for input(s): TROPONINI in the last 168 hours. ------------------------------------------------------------------------------------------------------------------  RADIOLOGY:  Ct Abdomen Pelvis W Contrast  04/15/2015  CLINICAL DATA:  Acute onset of left lower quadrant abdominal pain, radiating to the right lower quadrant. Nausea, vomiting and diarrhea. Initial encounter. EXAM: CT ABDOMEN AND PELVIS WITH CONTRAST TECHNIQUE: Multidetector CT imaging of the abdomen and pelvis was performed using the standard protocol following bolus administration of intravenous contrast. CONTRAST:  124mL OMNIPAQUE IOHEXOL 350 MG/ML SOLN COMPARISON:  CT of the abdomen and pelvis performed 12/17/2006, and abdominal ultrasound performed 12/18/2006 FINDINGS: Mild right basilar opacity may reflect atelectasis or mild pneumonia. The patient is status post right-sided mastectomy. The liver and spleen are unremarkable in appearance. The gallbladder is within  normal limits. The pancreas and adrenal  glands are unremarkable. The kidneys are unremarkable in appearance. There is no evidence of hydronephrosis. No renal or ureteral stones are seen. Mild nonspecific perinephric stranding is noted bilaterally. The small bowel is unremarkable in appearance. The stomach is within normal limits. No acute vascular abnormalities are seen. Mild scattered calcification is noted along the abdominal aorta and its branches. The appendix is normal in caliber, without evidence of appendicitis. Focal soft tissue inflammation is noted about the distal descending and proximal sigmoid colon, with trace associated free fluid, compatible with acute diverticulitis. Minimal free air is noted within the overlying mesentery, reflecting microperforation. Scattered diverticulosis is noted along the descending and proximal sigmoid colon. There is mild soft tissue inflammation along the left mid ureter, reflecting the adjacent diverticulitis. The bladder is mildly distended and grossly unremarkable. The patient is status post hysterectomy. No suspicious adnexal masses are seen. No inguinal lymphadenopathy is seen. No acute osseous abnormalities are identified. IMPRESSION: 1. Acute diverticulitis at the distal descending and proximal sigmoid colon, with focal soft tissue inflammation and trace associated free fluid. Minimal overlying free air noted within the mesentery, reflecting microperforation. 2. Mild soft tissue inflammation along the left mid ureter reflects the adjacent diverticulitis. 3. Mild right basilar airspace opacity may reflect atelectasis or mild pneumonia. 4. Scattered diverticulosis along the descending and proximal sigmoid colon. 5. Mild scattered calcification along the abdominal aorta and its branches. These results were called by telephone at the time of interpretation on 04/15/2015 at 4:42 am to Dr. Delman Kitten, who verbally acknowledged these results. Electronically Signed   By:  Garald Balding M.D.   On: 04/15/2015 04:44     ASSESSMENT AND PLAN:    * Acute diverticulitis with microperforation and sepsis - Improving She is on IV antibiotics and IV fluids. Clear liquid diet.  * Diabetes mellitus type 2 Hold her oral hypoglycemics. Patient is on clear liquids.  sliding scale insulin. Add lantus 8 Units daily  * Hypertension Continue on her hydrochlorothiazide. Resume Norvasc and ramipril. Labetalol IV PRN  * Mild hyponatremia, asymptomatic. Likely from dehydration. resolved  * DVT prophylaxis with Heparin SQ  All the records are reviewed and case discussed with Care Management/Social Workerr. Management plans discussed with the patient, family and they are in agreement.  TOTAL TIME TAKING CARE OF THIS PATIENT: 30 minutes.   POSSIBLE D/C IN 1-2 DAYS, DEPENDING ON CLINICAL CONDITION.  Hillary Bow R M.D on 04/16/2015 at 10:17 AM  Between 7am to 6pm - Pager - [phone]  After 6pm go to [email or web address] - password [email or web address] [facility]  Office  [phone]  CC: Primary care physician; Pcp Not In System  Note: This dictation was prepared with Dragon dictation along with smaller phrase technology. Any transcriptional errors that result from this process are unintentional.

## 2015-04-17 LAB — CBC WITH DIFFERENTIAL/PLATELET
BASOS PCT: 1 %
Basophils Absolute: 0.1 10*3/uL (ref 0–0.1)
EOS ABS: 0.2 10*3/uL (ref 0–0.7)
Eosinophils Relative: 1 %
HCT: 34.8 % — ABNORMAL LOW (ref 35.0–47.0)
HEMOGLOBIN: 12 g/dL (ref 12.0–16.0)
Lymphocytes Relative: 25 %
Lymphs Abs: 3.1 10*3/uL (ref 1.0–3.6)
MCH: 30.1 pg (ref 26.0–34.0)
MCHC: 34.6 g/dL (ref 32.0–36.0)
MCV: 87.1 fL (ref 80.0–100.0)
MONOS PCT: 12 %
Monocytes Absolute: 1.5 10*3/uL — ABNORMAL HIGH (ref 0.2–0.9)
NEUTROS PCT: 61 %
Neutro Abs: 7.9 10*3/uL — ABNORMAL HIGH (ref 1.4–6.5)
Platelets: 402 10*3/uL (ref 150–440)
RBC: 3.99 MIL/uL (ref 3.80–5.20)
RDW: 12.2 % (ref 11.5–14.5)
WBC: 12.8 10*3/uL — AB (ref 3.6–11.0)

## 2015-04-17 LAB — GLUCOSE, CAPILLARY
GLUCOSE-CAPILLARY: 217 mg/dL — AB (ref 65–99)
GLUCOSE-CAPILLARY: 305 mg/dL — AB (ref 65–99)
Glucose-Capillary: 206 mg/dL — ABNORMAL HIGH (ref 65–99)
Glucose-Capillary: 254 mg/dL — ABNORMAL HIGH (ref 65–99)

## 2015-04-17 NOTE — Progress Notes (Signed)
[place] at [place] NAME: Gail Stevenson    MR#:  [id number]  DATE OF BIRTH:  October 07, 1955  SUBJECTIVE:  CHIEF COMPLAINT:   Chief Complaint  Patient presents with  . Abdominal Pain   Abd pain and nausea better. Up and ambulating  REVIEW OF SYSTEMS:    Review of Systems  Constitutional: Negative for fever and chills.  HENT: Negative for sore throat.   Eyes: Negative for blurred vision, double vision and pain.  Respiratory: Negative for cough, hemoptysis, shortness of breath and wheezing.   Cardiovascular: Negative for chest pain, palpitations, orthopnea and leg swelling.  Gastrointestinal: Positive for nausea and abdominal pain. Negative for heartburn, vomiting, diarrhea and constipation.  Genitourinary: Negative for dysuria and hematuria.  Musculoskeletal: Negative for back pain and joint pain.  Skin: Negative for rash.  Neurological: Negative for sensory change, speech change, focal weakness and headaches.  Endo/Heme/Allergies: Does not bruise/bleed easily.  Psychiatric/Behavioral: Negative for depression. The patient is not nervous/anxious.     DRUG ALLERGIES:   Allergies  Allergen Reactions  . Patanol [Olopatadine]     VITALS:  Blood pressure 134/60, pulse 97, temperature 97.5 F (36.4 C), temperature source Oral, resp. rate 16, height 5' (1.524 m), weight 83.462 kg (184 lb), SpO2 96 %.  PHYSICAL EXAMINATION:   Physical Exam  GENERAL:  60 y.o.-year-old patient lying in the bed with no acute distress. Obese EYES: Pupils equal, round, reactive to light and accommodation. No scleral icterus. Extraocular muscles intact.  HEENT: Head atraumatic, normocephalic. Oropharynx and nasopharynx clear.  NECK:  Supple, no jugular venous distention. No thyroid enlargement, no tenderness.  LUNGS: Normal breath sounds bilaterally, no wheezing, rales, rhonchi. No use of accessory muscles of respiration.  CARDIOVASCULAR: S1, S2  normal. No murmurs, rubs, or gallops.  ABDOMEN: Soft, nondistended. Bowel sounds present. No organomegaly or mass. Tender LLQ EXTREMITIES: No cyanosis, clubbing or edema b/l.    NEUROLOGIC: Cranial nerves II through XII are intact. No focal Motor or sensory deficits b/l.   PSYCHIATRIC: The patient is alert and oriented x 3.  SKIN: No obvious rash, lesion, or ulcer.   LABORATORY PANEL:   CBC  Recent Labs Lab 04/17/15 0700  WBC 12.8*  HGB 12.0  HCT 34.8*  PLT 402   ------------------------------------------------------------------------------------------------------------------ Chemistries   Recent Labs Lab 04/14/15 2348 04/16/15 0522  NA 133* 139  K 3.7 3.7  CL 98* 100*  CO2 25 30  GLUCOSE 368* 236*  BUN 16 6  CREATININE 1.01* 0.80  CALCIUM 8.8* 8.6*  AST 31  --   ALT 30  --   ALKPHOS 61  --   BILITOT 0.4  --    ------------------------------------------------------------------------------------------------------------------  Cardiac Enzymes No results for input(s): TROPONINI in the last 168 hours. ------------------------------------------------------------------------------------------------------------------  RADIOLOGY:  No results found.   ASSESSMENT AND PLAN:    * Acute diverticulitis with microperforation and sepsis - Improving She is on IV antibiotics and IV fluids.   * Diabetes mellitus type 2 Held her oral hypoglycemics. Patient is on clear liquids. Advance diet today sliding scale insulin. Added lantus 8 Units daily  * Hypertension Continue on her hydrochlorothiazide. Resumed Norvasc and ramipril. Labetalol IV PRN  * Mild hyponatremia, asymptomatic. Likely from dehydration. resolved  * DVT prophylaxis with Heparin SQ  All the records are reviewed and case discussed with Care Management/Social Workerr. Management plans discussed with the patient, family and they are in agreement.  TOTAL TIME TAKING CARE OF THIS  PATIENT: 30 minutes.    Possible discharge tomorrow.  Continue home medications as before at discharge.  Hillary Bow R M.D on 04/17/2015 at 11:58 AM  Between 7am to 6pm - Pager - [phone]  After 6pm go to [email or web address] - password [email or web address] [facility]  Office  [phone]  CC: Primary care physician; Pcp Not In System  Note: This dictation was prepared with Dragon dictation along with smaller phrase technology. Any transcriptional errors that result from this process are unintentional.

## 2015-04-17 NOTE — Progress Notes (Signed)
Patient ID: Gail Stevenson, female   DOB: [birth year], 60 y.o.   MRN: [id number] No complaints. Says llq abd pain is much improved Had a good BM this am. Abdomen is soft, good bs, mild llq tenderness. Doing well. Advance diet. Likely can be discharged tomorrow on oral antibiotics

## 2015-04-18 LAB — BASIC METABOLIC PANEL
Anion gap: 9 (ref 5–15)
BUN: 8 mg/dL (ref 6–20)
CHLORIDE: 101 mmol/L (ref 101–111)
CO2: 29 mmol/L (ref 22–32)
CREATININE: 0.92 mg/dL (ref 0.44–1.00)
Calcium: 9.1 mg/dL (ref 8.9–10.3)
GFR calc Af Amer: >60 mL/min (ref >60)
GFR calc non Af Amer: >60 mL/min (ref >60)
GLUCOSE: 276 mg/dL — AB (ref 65–99)
Potassium: 3.3 mmol/L — ABNORMAL LOW (ref 3.5–5.1)
SODIUM: 139 mmol/L (ref 135–145)

## 2015-04-18 LAB — CBC
HCT: 36.1 % (ref 35.0–47.0)
HEMOGLOBIN: 12.2 g/dL (ref 12.0–16.0)
MCH: 29.5 pg (ref 26.0–34.0)
MCHC: 33.8 g/dL (ref 32.0–36.0)
MCV: 87.4 fL (ref 80.0–100.0)
Platelets: 425 10*3/uL (ref 150–440)
RBC: 4.13 MIL/uL (ref 3.80–5.20)
RDW: 12.5 % (ref 11.5–14.5)
WBC: 12.3 10*3/uL — ABNORMAL HIGH (ref 3.6–11.0)

## 2015-04-18 LAB — GLUCOSE, CAPILLARY: GLUCOSE-CAPILLARY: 259 mg/dL — AB (ref 65–99)

## 2015-04-18 MED ORDER — AMOXICILLIN-POT CLAVULANATE 600-42.9 MG/5ML PO SUSR: 875.0000 mg | Freq: Two times a day (BID) | ORAL | Status: AC

## 2015-04-18 NOTE — Progress Notes (Signed)
[place] at [place] NAME: Gail Stevenson    MR#:  [id number]  DATE OF BIRTH:  [birth year]  SUBJECTIVE:  CHIEF COMPLAINT:   Chief Complaint  Patient presents with  . Abdominal Pain   No complaint.  REVIEW OF SYSTEMS:    Review of Systems  Constitutional: Negative for fever and chills.  HENT: Negative for sore throat.   Eyes: Negative for blurred vision, double vision and pain.  Respiratory: Negative for cough, hemoptysis, shortness of breath and wheezing.   Cardiovascular: Negative for chest pain, palpitations, orthopnea and leg swelling.  Gastrointestinal: No nausea and abdominal pain. Negative for heartburn, vomiting, diarrhea and constipation.  Genitourinary: Negative for dysuria and hematuria.  Musculoskeletal: Negative for back pain and joint pain.  Skin: Negative for rash.  Neurological: Negative for sensory change, speech change, focal weakness and headaches.  Endo/Heme/Allergies: Does not bruise/bleed easily.  Psychiatric/Behavioral: Negative for depression. The patient is not nervous/anxious.     DRUG ALLERGIES:   Allergies  Allergen Reactions  . Patanol [Olopatadine]     VITALS:  Blood pressure 129/79, pulse 113, temperature 97.8 F (36.6 C), temperature source Oral, resp. rate 16, height 5' (1.524 m), weight 83.462 kg (184 lb), SpO2 100 %.  PHYSICAL EXAMINATION:   Physical Exam  GENERAL:  60 y.o.-year-old patient lying in the bed with no acute distress. Obese EYES: Pupils equal, round, reactive to light and accommodation. No scleral icterus. Extraocular muscles intact.  HEENT: Head atraumatic, normocephalic. Oropharynx and nasopharynx clear.  NECK:  Supple, no jugular venous distention. No thyroid enlargement, no tenderness.  LUNGS: Normal breath sounds bilaterally, no wheezing, rales, rhonchi. No use of accessory muscles of respiration.  CARDIOVASCULAR: S1, S2 normal. No murmurs, rubs, or gallops.   ABDOMEN: Soft, nondistended. Bowel sounds present. No organomegaly or mass. No tenderness. LLQ EXTREMITIES: No cyanosis, clubbing or edema.    NEUROLOGIC: Cranial nerves II through XII are intact. No focal Motor or sensory deficits.   PSYCHIATRIC: The patient is alert and oriented x 3.  SKIN: No obvious rash, lesion, or ulcer.   LABORATORY PANEL:   CBC  Recent Labs Lab 04/18/15 0702  WBC 12.3*  HGB 12.2  HCT 36.1  PLT 425   ------------------------------------------------------------------------------------------------------------------ Chemistries   Recent Labs Lab 04/14/15 2348  04/18/15 0702  NA 133*  < > 139  K 3.7  < > 3.3*  CL 98*  < > 101  CO2 25  < > 29  GLUCOSE 368*  < > 276*  BUN 16  < > 8  CREATININE 1.01*  < > 0.92  CALCIUM 8.8*  < > 9.1  AST 31  --   --   ALT 30  --   --   ALKPHOS 61  --   --   BILITOT 0.4  --   --   < > = values in this interval not displayed. ------------------------------------------------------------------------------------------------------------------  Cardiac Enzymes No results for input(s): TROPONINI in the last 168 hours. ------------------------------------------------------------------------------------------------------------------  RADIOLOGY:  No results found.   ASSESSMENT AND PLAN:    * Acute diverticulitis with microperforation and sepsis - Improved. She is on IV antibiotics and IV fluids. Tolerated diet. Discharge pr surgeon.  * Diabetes mellitus type 2. Not ideally controlled. Held her oral hypoglycemics. Patient is on sliding scale insulin and Added lantus 8 Units daily. Resume po DM meds after discharge. Follow up PCP to adjust meds.  * Hypertension Continue on her hydrochlorothiazide. Resumed Norvasc and  ramipril. Labetalol IV PRN  * Mild hyponatremia, asymptomatic. Likely from dehydration. resolved  * DVT prophylaxis with Heparin SQ  All the records are reviewed and case discussed with Care  Management/Social Workerr. Management plans discussed with the patient, family and they are in agreement.  TOTAL TIME TAKING CARE OF THIS PATIENT: 25 minutes.   Per Surgeon, discharge today.  Continue home medications as before at discharge.  Demetrios Loll M.D on 04/18/2015 at 1:08 PM  Between 7am to 6pm - Pager - [phone]  After 6pm go to [email or web address] - password [email or web address] [facility]  Office  [phone]  CC: Primary care physician; Pcp Not In System  Note: This dictation was prepared with Dragon dictation along with smaller phrase technology. Any transcriptional errors that result from this process are unintentional.

## 2015-04-18 NOTE — Progress Notes (Signed)
Inpatient Diabetes Program Recommendations  AACE/ADA: New Consensus Statement on Inpatient Glycemic Control (2015)  Target Ranges:  Prepandial:   less than 140 mg/dL      Peak postprandial:   less than 180 mg/dL (1-2 hours)      Critically ill patients:  140 - 180 mg/dL  Results for Gail Stevenson, Gail Stevenson (MRN [id number]) as of 04/18/2015 09:05  Ref. Range 04/17/2015 07:55 04/17/2015 11:47 04/17/2015 16:28 04/17/2015 21:26 04/18/2015 07:46  Glucose-Capillary Latest Ref Range: 65-99 mg/dL 217 (H) 305 (H) 206 (H) 254 (H) 259 (H)   Review of Glycemic Control  Diabetes history: DM2 Outpatient Diabetes medications: Januvia 100 daily, Metformin 1000-1500 mg BID, Glipizide 10 mg BID Current orders for Inpatient glycemic control: Lantus 8 units daily, Novolog 0-15 units TID with meals, Novolog 0-5 units QHS  Inpatient Diabetes Program Recommendations: Insulin - Basal: Please consider increasing Lantus to 13 units daily (based on 83.4 kg x 0.15 units). Insulin - Meal Coverage: Please consider ordering Novolog 4 units TID with meals if patient eats at least 50% of meal (in addition to Novolog correction scale). HgbA1C: A1C 10.3% indicating an average glucose of 249 mg/dl over the past 2-3 months. Patient needs to folllow up with PCP regarding glycemic control.  Thanks, Barnie Alderman, RN, MSN, CDE Diabetes Coordinator Inpatient Diabetes Program [phone] (Team Pager from [place] to [place]) [phone] ([facility] office) [phone] [facility] office) [phone] [facility] office)

## 2015-04-19 NOTE — Discharge Summary (Signed)
Physician Discharge Summary  Patient ID: Gail Stevenson MRN: [id number] DOB/AGE: 10-09-1955 60 y.o.  Admit date: 04/15/2015 Discharge date: 04/19/2015  Admission [place] diverticulitis  Discharge Diagnoses:  Active Problems:   Acute diverticulitis   Discharged Condition: good  Hospital Course: 60 yr old with acute diverticulitis, improved on antibitoics.  Discharged home with po antibiotics  Consults: None  Significant Diagnostic Studies:   Treatments: antibiotics: antibiotics  Discharge Exam: Blood pressure 129/79, pulse 113, temperature 97.8 F (36.6 C), temperature source Oral, resp. rate 16, height 5' (1.524 m), weight 184 lb (83.462 kg), SpO2 100 %. General appearance: alert, cooperative and no distress GI: soft, non-tender; bowel sounds normal; no masses,  no organomegaly Extremities: extremities normal, atraumatic, no cyanosis or edema  Disposition: 01-Home or Self Care  Discharge Instructions    Call MD for:  persistant nausea and vomiting    Complete by:  As directed      Call MD for:  redness, tenderness, or signs of infection (pain, swelling, redness, odor or green/yellow discharge around incision site)    Complete by:  As directed      Call MD for:  severe uncontrolled pain    Complete by:  As directed      Call MD for:  temperature >100.4    Complete by:  As directed      Diet - low sodium heart healthy    Complete by:  As directed      Increase activity slowly    Complete by:  As directed      No wound care    Complete by:  As directed             Medication List    TAKE these medications        amLODipine 10 MG tablet  Commonly known as:  NORVASC  Take 10 mg by mouth daily.     amoxicillin-clavulanate 600-42.9 MG/5ML suspension  Commonly known as:  AUGMENTIN ES-600  Take 7.3 mLs (875 mg total) by mouth 2 (two) times daily.     beclomethasone 80 MCG/ACT inhaler  Commonly known as:  QVAR  Inhale into the lungs 4 (four) times daily as  needed.     benzonatate 100 MG capsule  Commonly known as:  TESSALON PERLES  Take 1 capsule (100 mg total) by mouth 3 (three) times daily as needed for cough.     carbamide peroxide 6.5 % otic solution  Commonly known as:  DEBROX  Place 5 drops into both ears 2 (two) times daily. For 3 days     glipiZIDE 10 MG tablet  Commonly known as:  GLUCOTROL  Take 10 mg by mouth 2 (two) times daily before a meal.     hydrochlorothiazide 25 MG tablet  Commonly known as:  HYDRODIURIL  Take 25 mg by mouth daily.     lovastatin 40 MG tablet  Commonly known as:  MEVACOR  Take 40 mg by mouth at bedtime.     metFORMIN 500 MG tablet  Commonly known as:  GLUCOPHAGE  Take 2-3 tablets by mouth 2 (two) times daily.     potassium chloride 10 MEQ tablet  Commonly known as:  K-DUR  Take 1 tablet (10 mEq total) by mouth daily.     PROAIR HFA 108 (90 Base) MCG/ACT inhaler  Generic drug:  albuterol  Inhale 2 puffs into the lungs 4 (four) times daily as needed.     ramipril 10 MG capsule  Commonly known as:  ALTACE  Take 10 mg by mouth 2 (two) times daily.     ranitidine 150 MG tablet  Commonly known as:  ZANTAC  Take 150 mg by mouth 2 (two) times daily.     sitaGLIPtin 100 MG tablet  Commonly known as:  JANUVIA  Take 100 mg by mouth daily.     traMADol 50 MG tablet  Commonly known as:  ULTRAM  Take 1 tablet (50 mg total) by mouth every 6 (six) hours as needed.     traMADol 50 MG tablet  Commonly known as:  ULTRAM  Take 1 tablet (50 mg total) by mouth every 6 (six) hours as needed.           Follow-up Information    Follow up with Hubbard Osika, MD. Go on 04/29/2015.   Specialty:  Surgery   Why:  @9am    s/p diverticulitis   Contact information:   [place] [id number] [phone]       Signed: Hubbard Leath 04/19/2015, 7:45 AM

## 2015-04-20 LAB — CULTURE, BLOOD (ROUTINE X 2)
Culture: NO GROWTH
Culture: NO GROWTH

## 2015-04-28 ENCOUNTER — Other Ambulatory Visit: Payer: Self-pay

## 2015-04-29 ENCOUNTER — Encounter: Payer: Self-pay | Admitting: Surgery

## 2015-04-29 ENCOUNTER — Ambulatory Visit (INDEPENDENT_AMBULATORY_CARE_PROVIDER_SITE_OTHER): Payer: Medicaid Other | Admitting: Surgery

## 2015-04-29 VITALS — BP 130/77 | HR 98 | Temp 97.9°F | Ht 62.5 in | Wt 183.0 lb

## 2015-04-29 DIAGNOSIS — K5720 Diverticulitis of large intestine with perforation and abscess without bleeding: Secondary | ICD-10-CM

## 2015-04-29 NOTE — Patient Instructions (Addendum)
To help you with your belching and gas you should start taking over-the-counter probiotics and yogurt daily.  When you come back in 2-3 weeks we will schedule a endoscopy and colonoscopy.

## 2015-04-29 NOTE — Progress Notes (Signed)
Subjective:     Patient ID: Gail Stevenson, female   DOB: [birth year], 60 y.o.   MRN: [id number]  HPI  60 yr old female With diverticulitis patient is still having some twinges of pain in the left lower quadrant. Patient states that she having much better bowel movements at this time. Patient has always got her appetite back and has done some of her energy back is still has some fatigue but denies any fever chills nausea or vomiting.   Review of Systems  Constitutional: Positive for activity change, appetite change and fatigue. Negative for fever, chills and unexpected weight change.  HENT: Positive for sore throat. Negative for congestion.   Respiratory: Negative for chest tightness, shortness of breath and wheezing.   Cardiovascular: Negative for chest pain, palpitations and leg swelling.  Gastrointestinal: Positive for abdominal pain and abdominal distention. Negative for nausea, diarrhea, constipation and blood in stool.  Genitourinary: Negative for dysuria and hematuria.  Musculoskeletal: Negative for back pain.  Skin: Negative for color change, pallor, rash and wound.  Hematological: Negative for adenopathy. Does not bruise/bleed easily.  Psychiatric/Behavioral: Negative for agitation. The patient is not nervous/anxious.        Filed Vitals:   04/29/15 0850  BP: 130/77  Pulse: 98  Temp: 97.9 F (36.6 C)    Objective:   Physical Exam  Constitutional: She is oriented to person, place, and time. She appears well-developed and well-nourished. No distress.  HENT:  Head: Normocephalic and atraumatic.  Eyes: Conjunctivae and EOM are normal. Pupils are equal, round, and reactive to light. No scleral icterus.  Neck: Normal range of motion. Neck supple. No tracheal deviation present.  Cardiovascular: Normal rate, regular rhythm, normal heart sounds and intact distal pulses.  Exam reveals no gallop and no friction rub.   No murmur heard. Pulmonary/Chest: Effort normal and breath sounds  normal. No respiratory distress. She has no wheezes.  Abdominal: Soft. Bowel sounds are normal. She exhibits no distension. There is tenderness. There is no rebound and no guarding.  Some mild abdominal pain in LLQ  Musculoskeletal: Normal range of motion. She exhibits no edema or tenderness.  Neurological: She is alert and oriented to person, place, and time. No cranial nerve deficit.  Skin: Skin is warm and dry. No rash noted. No erythema. No pallor.  Psychiatric: She has a normal mood and affect. Her behavior is normal. Judgment and thought content normal.  Vitals reviewed.      Assessment:     60 yr old female s/p Acute diverticulitis     Plan:     Patient still improving with diverticulitis will have her return in 2 weeks for follow-up exam. After that we'll schedule her with Dr. Allen Norris to have a colonoscopy and EGD to examine while she has such difficulty swallowing

## 2015-05-18 ENCOUNTER — Ambulatory Visit (INDEPENDENT_AMBULATORY_CARE_PROVIDER_SITE_OTHER): Payer: Medicaid Other | Admitting: Surgery

## 2015-05-18 ENCOUNTER — Encounter: Payer: Self-pay | Admitting: Surgery

## 2015-05-18 VITALS — BP 152/82 | HR 92 | Temp 98.2°F | Ht 63.0 in | Wt 182.6 lb

## 2015-05-18 DIAGNOSIS — K5792 Diverticulitis of intestine, part unspecified, without perforation or abscess without bleeding: Secondary | ICD-10-CM | POA: Diagnosis not present

## 2015-05-18 DIAGNOSIS — R1310 Dysphagia, unspecified: Secondary | ICD-10-CM | POA: Diagnosis not present

## 2015-05-18 NOTE — Patient Instructions (Signed)
I will call you with dates that we can set-up your EGD and Colonoscopy. You have specified that you would like to have this done at [facility].  Please call our office with any questions or concerns.

## 2015-05-18 NOTE — Progress Notes (Signed)
Subjective:     Patient ID: Gail Stevenson, female   DOB: Jan 28, 1956, 60 y.o.   MRN: [id number]  HPI  60 year old female who had acute diverticulitis in the beginning of March. Patient states that she's feeling much better that her pain has improved and she only gets a twinge of pain on her left side occasionally after having a bowel movement. Patient states that her bowel movements have been much better but she has not had any constipation. Patient states that her appetite has improved although not completely back to 100%. Patient has had problems with swallowing and feeling like food gets stuck for years she is now feeling as if some liquids and water gets stuck as well. Patient denies having any fever chills nausea vomiting diarrhea or constipation.   Review of Systems  Constitutional: Positive for appetite change. Negative for fever, chills, activity change and fatigue.  HENT: Negative for congestion and sneezing.   Respiratory: Negative for cough, wheezing and stridor.   Cardiovascular: Negative for chest pain, palpitations and leg swelling.  Gastrointestinal: Negative for nausea, vomiting, abdominal pain, diarrhea, constipation and abdominal distention.  Genitourinary: Negative for dysuria and hematuria.  Musculoskeletal: Negative for back pain and neck pain.  Skin: Negative for color change, pallor, rash and wound.  Neurological: Negative for dizziness and weakness.  Hematological: Negative for adenopathy. Does not bruise/bleed easily.  Psychiatric/Behavioral: Negative for agitation. The patient is not nervous/anxious.        Objective:   Physical Exam  Constitutional: She is oriented to person, place, and time. She appears well-developed and well-nourished. No distress.  HENT:  Head: Normocephalic and atraumatic.  Right Ear: External ear normal.  Left Ear: External ear normal.  Nose: Nose normal.  Mouth/Throat: Oropharynx is clear and moist. No oropharyngeal exudate.  Eyes:  Conjunctivae and EOM are normal. Pupils are equal, round, and reactive to light. No scleral icterus.  Neck: Normal range of motion. Neck supple. No tracheal deviation present.  Cardiovascular: Normal rate, regular rhythm and intact distal pulses.   Pulmonary/Chest: Effort normal and breath sounds normal. No respiratory distress.  Abdominal: Soft. Bowel sounds are normal. She exhibits no distension. There is no tenderness. There is no rebound and no guarding.  Musculoskeletal: Normal range of motion. She exhibits no edema or tenderness.  Neurological: She is alert and oriented to person, place, and time. No cranial nerve deficit.  Skin: Skin is warm and dry. No rash noted. No erythema. No pallor.  Psychiatric: She has a normal mood and affect. Her behavior is normal. Judgment and thought content normal.  Vitals reviewed.      Filed Vitals:   05/18/15 0913  BP: 152/82  Pulse: 92  Temp: 98.2 F (36.8 C)    Assessment:     60 yr old female s/p acute diverticulitis and dysphagia     Plan:     Patient much improved from bowel of acute diverticulitis really not having much pain at this time. We'll send her for GI follow-up for colonoscopy as well as workup of her his dysphagia and likely EGD as well.

## 2015-05-20 ENCOUNTER — Telehealth: Payer: Self-pay

## 2015-05-20 ENCOUNTER — Other Ambulatory Visit: Payer: Self-pay

## 2015-05-20 MED ORDER — NA SULFATE-K SULFATE-MG SULF 17.5-3.13-1.6 GM/177ML PO SOLN: 1.0000 | ORAL | Status: DC

## 2015-05-20 NOTE — Telephone Encounter (Signed)
No authorization is required with medicaid for CPT: [id number] and [phone]

## 2015-05-20 NOTE — Telephone Encounter (Signed)
Called patient to schedule. She will have an EGD and Colonoscopy on 07/12/15 with Dr. Allen Norris for Dysphagia and Diverticulitis.  Ginger to place orders, notified at this time.  Suprep sent to pharmacy and paperwork sent to patient's home. Address verified.

## 2015-06-06 ENCOUNTER — Telehealth: Payer: Self-pay | Admitting: Gastroenterology

## 2015-06-06 NOTE — Telephone Encounter (Signed)
Patient called to cancel her colonoscopy. When I asked for a reason she stated there wasn't one.

## 2015-06-06 NOTE — Telephone Encounter (Signed)
Noted. [facility] has been notified.

## 2015-06-12 ENCOUNTER — Emergency Department
Admission: EM | Admit: 2015-06-12 | Discharge: 2015-06-12 | Disposition: A | Discharge status: Home / Self Care | Payer: Medicaid Other | Attending: Emergency Medicine | Admitting: Emergency Medicine

## 2015-06-12 DIAGNOSIS — Z7982 Long term (current) use of aspirin: Secondary | ICD-10-CM | POA: Insufficient documentation

## 2015-06-12 DIAGNOSIS — Z853 Personal history of malignant neoplasm of breast: Secondary | ICD-10-CM | POA: Insufficient documentation

## 2015-06-12 DIAGNOSIS — Z7984 Long term (current) use of oral hypoglycemic drugs: Secondary | ICD-10-CM | POA: Insufficient documentation

## 2015-06-12 DIAGNOSIS — R1032 Left lower quadrant pain: Secondary | ICD-10-CM | POA: Diagnosis present

## 2015-06-12 DIAGNOSIS — Z87891 Personal history of nicotine dependence: Secondary | ICD-10-CM | POA: Insufficient documentation

## 2015-06-12 DIAGNOSIS — I10 Essential (primary) hypertension: Secondary | ICD-10-CM | POA: Diagnosis not present

## 2015-06-12 DIAGNOSIS — K5792 Diverticulitis of intestine, part unspecified, without perforation or abscess without bleeding: Secondary | ICD-10-CM | POA: Insufficient documentation

## 2015-06-12 DIAGNOSIS — Z79899 Other long term (current) drug therapy: Secondary | ICD-10-CM | POA: Diagnosis not present

## 2015-06-12 DIAGNOSIS — Z7951 Long term (current) use of inhaled steroids: Secondary | ICD-10-CM | POA: Insufficient documentation

## 2015-06-12 DIAGNOSIS — J4540 Moderate persistent asthma, uncomplicated: Secondary | ICD-10-CM | POA: Diagnosis not present

## 2015-06-12 DIAGNOSIS — E119 Type 2 diabetes mellitus without complications: Secondary | ICD-10-CM | POA: Diagnosis not present

## 2015-06-12 LAB — COMPREHENSIVE METABOLIC PANEL
ALBUMIN: 3.9 g/dL (ref 3.5–5.0)
ALK PHOS: 80 U/L (ref 38–126)
ALT: 36 U/L (ref 14–54)
AST: 32 U/L (ref 15–41)
Anion gap: 13 (ref 5–15)
BUN: 16 mg/dL (ref 6–20)
CALCIUM: 9 mg/dL (ref 8.9–10.3)
CO2: 24 mmol/L (ref 22–32)
CREATININE: 1.04 mg/dL — AB (ref 0.44–1.00)
Chloride: 102 mmol/L (ref 101–111)
GFR calc non Af Amer: 57 mL/min — ABNORMAL LOW (ref >60)
GLUCOSE: 402 mg/dL — AB (ref 65–99)
Potassium: 3.3 mmol/L — ABNORMAL LOW (ref 3.5–5.1)
SODIUM: 139 mmol/L (ref 135–145)
Total Bilirubin: 0.5 mg/dL (ref 0.3–1.2)
Total Protein: 7.9 g/dL (ref 6.5–8.1)

## 2015-06-12 LAB — URINALYSIS COMPLETE WITH MICROSCOPIC (ARMC ONLY)
Bacteria, UA: NONE SEEN
Bilirubin Urine: NEGATIVE
KETONES UR: NEGATIVE mg/dL
Nitrite: NEGATIVE
Protein, ur: NEGATIVE mg/dL
SPECIFIC GRAVITY, URINE: 1.03 (ref 1.005–1.030)
pH: 6 (ref 5.0–8.0)

## 2015-06-12 LAB — CBC
HCT: 38.5 % (ref 35.0–47.0)
Hemoglobin: 13 g/dL (ref 12.0–16.0)
MCH: 29.7 pg (ref 26.0–34.0)
MCHC: 33.9 g/dL (ref 32.0–36.0)
MCV: 87.6 fL (ref 80.0–100.0)
PLATELETS: 362 10*3/uL (ref 150–440)
RBC: 4.39 MIL/uL (ref 3.80–5.20)
RDW: 13.2 % (ref 11.5–14.5)
WBC: 14.4 10*3/uL — ABNORMAL HIGH (ref 3.6–11.0)

## 2015-06-12 LAB — LIPASE, BLOOD: Lipase: 24 U/L (ref 11–51)

## 2015-06-12 MED ORDER — METRONIDAZOLE 500 MG PO TABS: 500.0000 mg | ORAL_TABLET | Freq: Two times a day (BID) | ORAL | Status: DC

## 2015-06-12 MED ORDER — HYDROCODONE-ACETAMINOPHEN 5-325 MG PO TABS
1.0000 | ORAL_TABLET | ORAL | Status: DC | PRN
Start: 2015-06-12 — End: 2019-02-02

## 2015-06-12 MED ORDER — CIPROFLOXACIN HCL 500 MG PO TABS
ORAL_TABLET | ORAL | Status: AC
  Filled 2015-06-12: qty 1

## 2015-06-12 MED ORDER — METRONIDAZOLE 500 MG PO TABS
500.0000 mg | ORAL_TABLET | Freq: Once | ORAL | Status: AC
  Administered 2015-06-12: 500 mg via ORAL

## 2015-06-12 MED ORDER — CIPROFLOXACIN HCL 500 MG PO TABS: 500.0000 mg | ORAL_TABLET | Freq: Two times a day (BID) | ORAL | Status: DC

## 2015-06-12 MED ORDER — METRONIDAZOLE 500 MG PO TABS
ORAL_TABLET | ORAL | Status: AC
  Filled 2015-06-12: qty 1

## 2015-06-12 MED ORDER — CIPROFLOXACIN HCL 500 MG PO TABS
500.0000 mg | ORAL_TABLET | Freq: Once | ORAL | Status: AC
  Administered 2015-06-12: 500 mg via ORAL

## 2015-06-12 NOTE — ED Notes (Signed)
Pt states that she has been having llq pain since yesterday, pt states that she has been seen here for similar in the past but can't remember her dx. Pt points to her left lower area when showing the location of her pain, states some pain with urination on the "outside" pt reports normal bm earlier today

## 2015-06-12 NOTE — Discharge Instructions (Signed)
Diverticulitis  Diverticulitis is when small pockets that have formed in your colon (large intestine) become infected or swollen.  HOME CARE  · Follow your doctor's instructions.  · Follow a special diet if told by your doctor.  · When you feel better, your doctor may tell you to change your diet. You may be told to eat a lot of fiber. Fruits and vegetables are good sources of fiber. Fiber makes it easier to poop (have bowel movements).  · Take supplements or probiotics as told by your doctor.  · Only take medicines as told by your doctor.  · Keep all follow-up visits with your doctor.  GET HELP IF:  · Your pain does not get better.  · You have a hard time eating food.  · You are not pooping like normal.  GET HELP RIGHT AWAY IF:  · Your pain gets worse.  · Your problems do not get better.  · Your problems suddenly get worse.  · You have a fever.  · You keep throwing up (vomiting).  · You have bloody or black, tarry poop (stool).  MAKE SURE YOU:   · Understand these instructions.  · Will watch your condition.  · Will get help right away if you are not doing well or get worse.     This information is not intended to replace advice given to you by your health care provider. Make sure you discuss any questions you have with your health care provider.     Document Released: 07/18/2007 Document Revised: 02/03/2013 Document Reviewed: 12/24/2012  Elsevier Interactive Patient Education ©2016 [employer]

## 2015-06-12 NOTE — ED Notes (Signed)
Pt signed hard copy of discharge paperwork.  Pt understands discharge instructions and has no further questions.

## 2015-06-12 NOTE — ED Provider Notes (Signed)
[facility] Emergency Department Provider Note  ____________________________________________    I have reviewed the triage vital signs and the nursing notes.   HISTORY  Chief Complaint Abdominal Pain    HPI Gail Stevenson is a 60 y.o. female who presents with complaints of mild left lower quadrant abdominal pain for the last 3-4 days. She reports the pain is aching in nature. She denies fevers or chills. Last week she did have nausea vomiting and diarrhea that is resolved. She does have a history of diverticulitis. Overall she reports she feels well but wants to be checked out. She reports she hasn't taken her diabetes medication today because she was feeling ill earlier and nauseous.     Past Medical History  Diagnosis Date  . Cancer [facility]) 2003    right side total mastectomy  . Asthma   . Diabetes mellitus without complication ([place])   . Hypertension   . Hepatitis C   . Trigeminal neuralgia   . Diastolic dysfunction   . Hemorrhoids   . Diverticulitis     Patient Active Problem List   Diagnosis Date Noted  . Diverticulitis of colon with perforation   . Sepsis ([place])   . Type 2 diabetes mellitus without complication ([place])   . Essential hypertension   . Malignant neoplasm of female breast ([place])   . Morbid obesity ([place]) 04/02/2015  . Chest pain 04/02/2015  . Lactose intolerance 10/30/2014  . Fothergill's neuralgia 09/21/2014  . Left ventricular hypertrophy 02/16/2014  . Diastolic dysfunction 95/63/8756  . Reflux 01/19/2014  . Asthma, moderate persistent 11/27/2013  . Clinical depression 11/30/2004  . Chronic hepatitis C virus infection ([place]) 04/26/2004    Past Surgical History  Procedure Laterality Date  . Mastectomy Right 2003    total  . Breast surgery Right 2003    total mastectomy    Current Outpatient Rx  Name  Route  Sig  Dispense  Refill  . amLODipine (NORVASC) 10 MG tablet   Oral   Take 10 mg by mouth daily.         Marland Kitchen  aspirin EC 81 MG tablet   Oral   Take 81 mg by mouth daily.         . beclomethasone (QVAR) 80 MCG/ACT inhaler   Inhalation   Inhale into the lungs 4 (four) times daily as needed.         . benzonatate (TESSALON PERLES) 100 MG capsule   Oral   Take 1 capsule (100 mg total) by mouth 3 (three) times daily as needed for cough.   30 capsule   0   . BYDUREON 2 MG PEN   Injection   Inject 2 mg as directed once a week.      11     Dispense as written.   . carbamide peroxide (DEBROX) 6.5 % otic solution   Both Ears   Place 5 drops into both ears 2 (two) times daily. For 3 days   15 mL   2   . cloNIDine (CATAPRES) 0.1 MG tablet   Oral   Take 1 tablet by mouth 2 (two) times daily.      5   . glipiZIDE (GLUCOTROL) 10 MG tablet   Oral   Take 10 mg by mouth 2 (two) times daily before a meal.         . hydrochlorothiazide (HYDRODIURIL) 25 MG tablet   Oral   Take 25 mg by mouth daily.         Marland Kitchen  lovastatin (MEVACOR) 40 MG tablet   Oral   Take 40 mg by mouth at bedtime.         . metFORMIN (GLUCOPHAGE) 500 MG tablet   Oral   Take 2-3 tablets by mouth 2 (two) times daily.      3   . Na Sulfate-K Sulfate-Mg Sulf (SUPREP BOWEL PREP) SOLN   Oral   Take 1 kit by mouth as directed.   1 Bottle   0   . potassium chloride (K-DUR) 10 MEQ tablet   Oral   Take 1 tablet (10 mEq total) by mouth daily.   7 tablet   0   . PROAIR HFA 108 (90 Base) MCG/ACT inhaler   Inhalation   Inhale 2 puffs into the lungs 4 (four) times daily as needed.      5     Dispense as written.   . ramipril (ALTACE) 10 MG capsule   Oral   Take 10 mg by mouth 2 (two) times daily.         . ranitidine (ZANTAC) 150 MG tablet   Oral   Take 150 mg by mouth 2 (two) times daily.         . sitaGLIPtin (JANUVIA) 100 MG tablet   Oral   Take 100 mg by mouth daily.           Allergies Patanol  Family History  Problem Relation Age of Onset  . Gastric cancer Father   . Breast  cancer Maternal Aunt     Social History Social History  Substance Use Topics  . Smoking status: Former Smoker    Quit date: 05/17/1992  . Smokeless tobacco: Never Used  . Alcohol Use: No    Review of Systems  Constitutional: Negative for fever. Eyes: Negative for blurry vision ENT: Negative for sore throat Cardiovascular: Negative for chest pain Respiratory: Negative for shortness of breath. Gastrointestinal: As above Genitourinary: Negative for dysuria. Musculoskeletal: Negative for back pain. Skin: Negative for rash. Neurological: Negative for headache Psychiatric: no anxiety    ____________________________________________   PHYSICAL EXAM:  VITAL SIGNS: ED Triage Vitals  Enc Vitals Group     BP 06/12/15 1836 165/65 mmHg     Pulse Rate 06/12/15 1836 92     Resp 06/12/15 1836 18     Temp 06/12/15 1836 98.1 F (36.7 C)     Temp Source 06/12/15 1836 Oral     SpO2 06/12/15 1836 96 %     Weight 06/12/15 1836 184 lb (83.462 kg)     Height 06/12/15 1836 '5\' 2"'$  (1.575 m)     Head Cir --      Peak Flow --      Pain Score 06/12/15 1837 5     Pain Loc --      Pain Edu? --      Excl. in [place]? --      Constitutional: Alert and oriented. Well appearing and in no distress. Pleasant and interactive Eyes: Conjunctivae are normal. No erythema or injection ENT   Head: Normocephalic and atraumatic.   Mouth/Throat: Mucous membranes are moist. Cardiovascular: Normal rate, regular rhythm. Normal and symmetric distal pulses are present in the upper extremities. Respiratory: Normal respiratory effort without tachypnea nor retractions.  Gastrointestinal: Very mild tenderness to palpation left lower quadrant. No distention. There is no CVA tenderness. Genitourinary: deferred Musculoskeletal: Nontender with normal range of motion in all extremities. No lower extremity tenderness nor edema. Neurologic:  Normal speech and language. No  gross focal neurologic deficits are  appreciated. Skin:  Skin is warm, dry and intact. No rash noted. Psychiatric: Mood and affect are normal. Patient exhibits appropriate insight and judgment.  ____________________________________________    LABS (pertinent positives/negatives)  Labs Reviewed  COMPREHENSIVE METABOLIC PANEL - Abnormal; Notable for the following:    Potassium 3.3 (*)    Glucose, Bld 402 (*)    Creatinine, Ser 1.04 (*)    GFR calc non Af Amer 57 (*)    All other components within normal limits  CBC - Abnormal; Notable for the following:    WBC 14.4 (*)    All other components within normal limits  URINALYSIS COMPLETEWITH MICROSCOPIC ([facility] ONLY) - Abnormal; Notable for the following:    Color, Urine YELLOW (*)    APPearance CLEAR (*)    Glucose, UA >500 (*)    Hgb urine dipstick 1+ (*)    Leukocytes, UA TRACE (*)    Squamous Epithelial / LPF 0-5 (*)    All other components within normal limits  LIPASE, BLOOD    ____________________________________________   EKG  None  ____________________________________________    RADIOLOGY  None  ____________________________________________   PROCEDURES  Procedure(s) performed: none  Critical Care performed: none  ____________________________________________   INITIAL IMPRESSION / ASSESSMENT AND PLAN / ED COURSE  Pertinent labs & imaging results that were available during my care of the patient were reviewed by me and considered in my medical decision making (see chart for details).  Patient well-appearing and in no distress. Lab work demonstrates mild leukocytosis and elevated glucose. Offered to put an IV in the patient to give IV fluids with the patient prefers not to do this and will take her diabetes medication when she gets home. I suspect she is suffering from diverticulitis. Her abdominal exam is not concerning. I feel by mouth antibiotics as appropriate course of action. I discussed this with the patient and she agrees to try by  mouth antibiotics and she will return to the emergency department if worsening symptoms. ____________________________________________   FINAL CLINICAL IMPRESSION(S) / ED DIAGNOSES  Final diagnoses:  Diverticulitis of intestine without perforation or abscess without bleeding          Lavonia Drafts, MD 06/12/15 2008

## 2015-07-12 ENCOUNTER — Ambulatory Visit: Admission: RE | Admit: 2015-07-12 | Payer: Medicaid Other | Source: Ambulatory Visit | Admitting: Gastroenterology

## 2015-07-12 ENCOUNTER — Encounter: Admission: RE | Payer: Self-pay | Source: Ambulatory Visit

## 2015-07-12 SURGERY — COLONOSCOPY WITH PROPOFOL
Anesthesia: General

## 2015-09-20 ENCOUNTER — Emergency Department
Admission: EM | Admit: 2015-09-20 | Discharge: 2015-09-20 | Disposition: A | Discharge status: Home / Self Care | Payer: Medicaid Other | Attending: Emergency Medicine | Admitting: Emergency Medicine

## 2015-09-20 ENCOUNTER — Encounter: Payer: Self-pay | Admitting: Emergency Medicine

## 2015-09-20 DIAGNOSIS — L235 Allergic contact dermatitis due to other chemical products: Secondary | ICD-10-CM | POA: Diagnosis not present

## 2015-09-20 DIAGNOSIS — E119 Type 2 diabetes mellitus without complications: Secondary | ICD-10-CM | POA: Diagnosis not present

## 2015-09-20 DIAGNOSIS — Z87891 Personal history of nicotine dependence: Secondary | ICD-10-CM | POA: Diagnosis not present

## 2015-09-20 DIAGNOSIS — J45909 Unspecified asthma, uncomplicated: Secondary | ICD-10-CM | POA: Diagnosis not present

## 2015-09-20 DIAGNOSIS — Z7982 Long term (current) use of aspirin: Secondary | ICD-10-CM | POA: Insufficient documentation

## 2015-09-20 DIAGNOSIS — Z7984 Long term (current) use of oral hypoglycemic drugs: Secondary | ICD-10-CM | POA: Diagnosis not present

## 2015-09-20 DIAGNOSIS — I10 Essential (primary) hypertension: Secondary | ICD-10-CM | POA: Diagnosis not present

## 2015-09-20 DIAGNOSIS — R609 Edema, unspecified: Secondary | ICD-10-CM | POA: Diagnosis present

## 2015-09-20 DIAGNOSIS — Z79899 Other long term (current) drug therapy: Secondary | ICD-10-CM | POA: Insufficient documentation

## 2015-09-20 DIAGNOSIS — L253 Unspecified contact dermatitis due to other chemical products: Secondary | ICD-10-CM

## 2015-09-20 DIAGNOSIS — Z853 Personal history of malignant neoplasm of breast: Secondary | ICD-10-CM | POA: Insufficient documentation

## 2015-09-20 MED ORDER — TRIAMCINOLONE ACETONIDE 0.5 % EX OINT: 1.0000 "application " | TOPICAL_OINTMENT | Freq: Two times a day (BID) | CUTANEOUS | 0 refills | Status: DC

## 2015-09-20 MED ORDER — DIPHENHYDRAMINE HCL 12.5 MG/5ML PO ELIX
25.0000 mg | ORAL_SOLUTION | Freq: Once | ORAL | Status: AC
  Administered 2015-09-20: 25 mg via ORAL
  Filled 2015-09-20: qty 10

## 2015-09-20 NOTE — ED Triage Notes (Signed)
Pt presents to ED because she was washing her dishes with bleach and her fingers are swollen and erythematous. Pt rinsed fingers with cold water and applied coconut oil and coconut butter.

## 2015-09-20 NOTE — Discharge Instructions (Signed)
You have a skin irritation due to contact with highly-concentrated chlorine bleach. Avoid contact with bleach or any other chemicals until your symptoms have cleared. Use the steroid cream for skin irritation. Use petroleum jelly (Vaseline) as your moisturizer. Take OTC Benadryl (diphenhydramine) elixir for itch relief. Continue your daily meds as previously prescribed. Keep the hand cool, dry, and use rubber gloves for all housekeeping activities.

## 2015-10-05 ENCOUNTER — Encounter: Payer: Self-pay | Admitting: Physician Assistant

## 2015-10-05 NOTE — ED Provider Notes (Signed)
[facility] Emergency Department Provider Note ____________________________________________  Time seen: 1742  I have reviewed the triage vital signs and the nursing notes.  HISTORY  Chief Complaint  Edema and Burn (questionable chemical burn/irritation from bleach)  HPI Gail Stevenson is a 60 y.o. female presents to the ED for evaluation of chemical irritation to her hands after she mistakenly used highly-concentrated chlorine bleach, rated for high efficiency (HE) washers, in her dish-washing water. She admits to regularly using a capful of liquid bleach in her dish water. She inadvertently grabbed the HE bleach instead. She immediately noted itching, irritation, and burning to her hands. She has since washed her hands with copious amounts of cold water and has applied coconut oil & butter. She reports continued irritation to her hands. She denies any respiratory irritation, mucous membrane contact, or ingestion.   Past Medical History:  Diagnosis Date  . Asthma   . Cancer [facility]) 2003   right side total mastectomy  . Diabetes mellitus without complication ([place])   . Diastolic dysfunction   . Diverticulitis   . Hemorrhoids   . Hepatitis C   . Hypertension   . Trigeminal neuralgia     Patient Active Problem List   Diagnosis Date Noted  . Diverticulitis of colon with perforation   . Sepsis ([place])   . Type 2 diabetes mellitus without complication ([place])   . Essential hypertension   . Malignant neoplasm of female breast ([place])   . Morbid obesity ([place]) 04/02/2015  . Chest pain 04/02/2015  . Lactose intolerance 10/30/2014  . Fothergill's neuralgia 09/21/2014  . Left ventricular hypertrophy 02/16/2014  . Diastolic dysfunction 16/11/9602  . Reflux 01/19/2014  . Asthma, moderate persistent 11/27/2013  . Clinical depression 11/30/2004  . Chronic hepatitis C virus infection ([place]) 04/26/2004    Past Surgical History:  Procedure Laterality Date  . BREAST SURGERY  Right 2003   total mastectomy  . MASTECTOMY Right 2003   total    Prior to Admission medications   Medication Sig Start Date End Date Taking? Authorizing Provider  amLODipine (NORVASC) 10 MG tablet Take 10 mg by mouth daily.    Historical Provider, MD  aspirin EC 81 MG tablet Take 81 mg by mouth daily.    Historical Provider, MD  beclomethasone (QVAR) 80 MCG/ACT inhaler Inhale into the lungs 4 (four) times daily as needed.    Historical Provider, MD  BYDUREON 2 MG PEN Inject 2 mg as directed once a week. 04/04/15   Historical Provider, MD  ciprofloxacin (CIPRO) 500 MG tablet Take 1 tablet (500 mg total) by mouth 2 (two) times daily. 06/12/15   Lavonia Drafts, MD  cloNIDine (CATAPRES) 0.1 MG tablet Take 1 tablet by mouth 2 (two) times daily. 03/13/15   Historical Provider, MD  glipiZIDE (GLUCOTROL) 10 MG tablet Take 10 mg by mouth 2 (two) times daily before a meal.    Historical Provider, MD  hydrochlorothiazide (HYDRODIURIL) 25 MG tablet Take 25 mg by mouth daily.    Historical Provider, MD  HYDROcodone-acetaminophen (NORCO/VICODIN) 5-325 MG tablet Take 1 tablet by mouth every 4 (four) hours as needed for moderate pain. 06/12/15   Lavonia Drafts, MD  lovastatin (MEVACOR) 40 MG tablet Take 40 mg by mouth at bedtime.    Historical Provider, MD  metFORMIN (GLUCOPHAGE) 500 MG tablet Take 2-3 tablets by mouth 2 (two) times daily. 03/13/15   Historical Provider, MD  metroNIDAZOLE (FLAGYL) 500 MG tablet Take 1 tablet (500 mg total) by mouth 2 (two)  times daily after a meal. 06/12/15   Lavonia Drafts, MD  Na Sulfate-K Sulfate-Mg Sulf (SUPREP BOWEL PREP) SOLN Take 1 kit by mouth as directed. 05/20/15   Lucilla Lame, MD  potassium chloride (K-DUR) 10 MEQ tablet Take 1 tablet (10 mEq total) by mouth daily. 07/18/14   Johnn Hai, PA-C  PROAIR HFA 108 [phone] Base) MCG/ACT inhaler Inhale 2 puffs into the lungs 4 (four) times daily as needed. 03/13/15   Historical Provider, MD  ramipril (ALTACE) 10 MG capsule Take 10 mg  by mouth 2 (two) times daily.    Historical Provider, MD  ranitidine (ZANTAC) 150 MG tablet Take 150 mg by mouth 2 (two) times daily.    Historical Provider, MD  sitaGLIPtin (JANUVIA) 100 MG tablet Take 100 mg by mouth daily.    Historical Provider, MD  triamcinolone ointment (KENALOG) 0.5 % Apply 1 application topically 2 (two) times daily. 09/20/15   Sharla Tankard V Bacon Randeep Biondolillo, PA-C    Allergies Patanol [olopatadine]  Family History  Problem Relation Age of Onset  . Gastric cancer Father   . Breast cancer Maternal Aunt     Social History Social History  Substance Use Topics  . Smoking status: Former Smoker    Quit date: 05/17/1992  . Smokeless tobacco: Never Used  . Alcohol use No    Review of Systems  Constitutional: Negative for fever. Eyes: Negative for visual changes. ENT: Negative for sore throat. Cardiovascular: Negative for chest pain. Respiratory: Negative for shortness of breath. Skin: Positive for rash. Neurological: Negative for headaches, focal weakness or numbness. ____________________________________________  PHYSICAL EXAM:  VITAL SIGNS: ED Triage Vitals  Enc Vitals Group     BP 09/20/15 1706 (!) 161/82     Pulse Rate 09/20/15 1706 91     Resp 09/20/15 1706 18     Temp 09/20/15 1706 98.5 F (36.9 C)     Temp Source 09/20/15 1706 Oral     SpO2 09/20/15 1706 97 %     Weight 09/20/15 1706 180 lb (81.6 kg)     Height 09/20/15 1706 '5\' 2"'$  (1.575 m)     Head Circumference --      Peak Flow --      Pain Score 09/20/15 1708 10     Pain Loc --      Pain Edu? --      Excl. in [place]? --     Constitutional: Alert and oriented. Well appearing and in no distress. Head: Normocephalic and atraumatic. Cardiovascular: Normal rate, regular rhythm.  Respiratory: Normal respiratory effort. No wheezes/rales/rhonchi. Skin:  Skin is warm, dry and intact. Patient with erythematous, slightly edematous skin to the dorsal hands and fingers. No abrasions, fissures, or weeping  noted. Mild excoriations noted.  ____________________________________________  PROCEDURES  Diphenhydramine elixir 25 mg ____________________________________________  INITIAL IMPRESSION / ASSESSMENT AND PLAN / ED COURSE  Patient with a contact dermatitis due to exposure to highly concentrated, viscous chlorine bleach. She will dose OTC Benadryl and apply the prescribed steroid cream as directed. Follow-up with the PCP as needed.   Clinical Course   ____________________________________________  FINAL CLINICAL IMPRESSION(S) / ED DIAGNOSES  Final diagnoses:  Contact dermatitis due to other chemical product     Melvenia Needles, PA-C 10/05/15 1722    Harvest Dark, MD 10/11/15 2330

## 2015-11-02 ENCOUNTER — Encounter: Payer: Self-pay | Admitting: Emergency Medicine

## 2015-11-02 ENCOUNTER — Emergency Department
Admission: EM | Admit: 2015-11-02 | Discharge: 2015-11-03 | Disposition: A | Discharge status: Home / Self Care | Payer: Medicaid Other | Attending: Emergency Medicine | Admitting: Emergency Medicine

## 2015-11-02 DIAGNOSIS — Z7984 Long term (current) use of oral hypoglycemic drugs: Secondary | ICD-10-CM | POA: Diagnosis not present

## 2015-11-02 DIAGNOSIS — J45909 Unspecified asthma, uncomplicated: Secondary | ICD-10-CM | POA: Diagnosis not present

## 2015-11-02 DIAGNOSIS — N390 Urinary tract infection, site not specified: Secondary | ICD-10-CM | POA: Diagnosis not present

## 2015-11-02 DIAGNOSIS — Z853 Personal history of malignant neoplasm of breast: Secondary | ICD-10-CM | POA: Diagnosis not present

## 2015-11-02 DIAGNOSIS — Z79899 Other long term (current) drug therapy: Secondary | ICD-10-CM | POA: Insufficient documentation

## 2015-11-02 DIAGNOSIS — Z7982 Long term (current) use of aspirin: Secondary | ICD-10-CM | POA: Insufficient documentation

## 2015-11-02 DIAGNOSIS — R1084 Generalized abdominal pain: Secondary | ICD-10-CM | POA: Diagnosis present

## 2015-11-02 DIAGNOSIS — E119 Type 2 diabetes mellitus without complications: Secondary | ICD-10-CM | POA: Insufficient documentation

## 2015-11-02 DIAGNOSIS — I10 Essential (primary) hypertension: Secondary | ICD-10-CM | POA: Insufficient documentation

## 2015-11-02 DIAGNOSIS — Z87891 Personal history of nicotine dependence: Secondary | ICD-10-CM | POA: Diagnosis not present

## 2015-11-02 LAB — COMPREHENSIVE METABOLIC PANEL
ALBUMIN: 3.7 g/dL (ref 3.5–5.0)
ALK PHOS: 76 U/L (ref 38–126)
ALT: 32 U/L (ref 14–54)
AST: 23 U/L (ref 15–41)
Anion gap: 7 (ref 5–15)
BILIRUBIN TOTAL: 0.7 mg/dL (ref 0.3–1.2)
BUN: 21 mg/dL — AB (ref 6–20)
CALCIUM: 9 mg/dL (ref 8.9–10.3)
CO2: 28 mmol/L (ref 22–32)
Chloride: 105 mmol/L (ref 101–111)
Creatinine, Ser: 1.18 mg/dL — ABNORMAL HIGH (ref 0.44–1.00)
GFR calc Af Amer: 57 mL/min — ABNORMAL LOW (ref >60)
GFR calc non Af Amer: 49 mL/min — ABNORMAL LOW (ref >60)
GLUCOSE: 148 mg/dL — AB (ref 65–99)
POTASSIUM: 3.5 mmol/L (ref 3.5–5.1)
Sodium: 140 mmol/L (ref 135–145)
TOTAL PROTEIN: 8.1 g/dL (ref 6.5–8.1)

## 2015-11-02 LAB — CBC
HEMATOCRIT: 40.6 % (ref 35.0–47.0)
Hemoglobin: 13.6 g/dL (ref 12.0–16.0)
MCH: 29.8 pg (ref 26.0–34.0)
MCHC: 33.6 g/dL (ref 32.0–36.0)
MCV: 88.9 fL (ref 80.0–100.0)
Platelets: 385 10*3/uL (ref 150–440)
RBC: 4.57 MIL/uL (ref 3.80–5.20)
RDW: 13.9 % (ref 11.5–14.5)
WBC: 18.4 10*3/uL — ABNORMAL HIGH (ref 3.6–11.0)

## 2015-11-02 LAB — URINALYSIS COMPLETE WITH MICROSCOPIC (ARMC ONLY)
BACTERIA UA: NONE SEEN
Bilirubin Urine: NEGATIVE
GLUCOSE, UA: NEGATIVE mg/dL
HGB URINE DIPSTICK: NEGATIVE
Ketones, ur: NEGATIVE mg/dL
Nitrite: NEGATIVE
PROTEIN: 30 mg/dL — AB
Specific Gravity, Urine: 1.026 (ref 1.005–1.030)
pH: 5 (ref 5.0–8.0)

## 2015-11-02 LAB — LIPASE, BLOOD: Lipase: 32 U/L (ref 11–51)

## 2015-11-02 MED ORDER — IOPAMIDOL (ISOVUE-300) INJECTION 61%
30.0000 mL | Freq: Once | INTRAVENOUS | Status: AC | PRN
  Administered 2015-11-02: 30 mL via ORAL

## 2015-11-02 MED ORDER — MORPHINE SULFATE (PF) 4 MG/ML IV SOLN
4.0000 mg | Freq: Once | INTRAVENOUS | Status: AC
  Administered 2015-11-02: 4 mg via INTRAVENOUS
  Filled 2015-11-02: qty 1

## 2015-11-02 MED ORDER — ONDANSETRON HCL 4 MG/2ML IJ SOLN
4.0000 mg | Freq: Once | INTRAMUSCULAR | Status: AC
  Administered 2015-11-02: 4 mg via INTRAVENOUS
  Filled 2015-11-02: qty 2

## 2015-11-02 MED ORDER — SODIUM CHLORIDE 0.9 % IV BOLUS (SEPSIS)
1000.0000 mL | Freq: Once | INTRAVENOUS | Status: AC
  Administered 2015-11-02: 1000 mL via INTRAVENOUS

## 2015-11-02 NOTE — ED Triage Notes (Signed)
Pt in with co generalized abd pain x 2 days states has had loose stools and nausea. Pt states she has a hx of reflux but feels different.

## 2015-11-02 NOTE — ED Notes (Signed)
Pt denies having fevers while at home but reports having cold chills over the past three days. Pt verbalized nausea x 3 days without vomiting. Pt also verbalized having been hospitalized earlier this year for abd pain that felt similar to this episode ( pt unable to remember the dx from admission)

## 2015-11-02 NOTE — ED Provider Notes (Signed)
[facility] Emergency Department Provider Note   ____________________________________________   I have reviewed the triage vital signs and the nursing notes.   HISTORY  Chief Complaint Abdominal Pain   History limited by: Not Limited   HPI Gail Stevenson is a 60 y.o. female who presents to the emergency department tonight because of concern for abdominal pain. It is generalized. The patient states that it started 3 days ago. Initially it would come and go, however this eveningit became constant. It is severe and has caused the patient to cry. She did have associated non bloody diarrhea for the past couple of days. She has had associated nausea. States that she has had similar pain in the past requiring admission, although could not remember her diagnosis at that time.    Past Medical History:  Diagnosis Date  . Asthma   . Cancer [facility]) 2003   right side total mastectomy  . Diabetes mellitus without complication ([facility])   . Diastolic dysfunction   . Diverticulitis   . Hemorrhoids   . Hepatitis C   . Hypertension   . Trigeminal neuralgia     Patient Active Problem List   Diagnosis Date Noted  . Diverticulitis of colon with perforation   . Sepsis ([facility])   . Type 2 diabetes mellitus without complication ([facility])   . Essential hypertension   . Malignant neoplasm of female breast ([facility])   . Morbid obesity ([facility]) 04/02/2015  . Chest pain 04/02/2015  . Lactose intolerance 10/30/2014  . Fothergill's neuralgia 09/21/2014  . Left ventricular hypertrophy 02/16/2014  . Diastolic dysfunction 02/16/2014  . Reflux 01/19/2014  . Asthma, moderate persistent 11/27/2013  . Clinical depression 11/30/2004  . Chronic hepatitis C virus infection ([facility]) 04/26/2004    Past Surgical History:  Procedure Laterality Date  . BREAST SURGERY Right 2003   total mastectomy  . MASTECTOMY Right 2003   total    Prior to Admission medications   Medication Sig Start Date End  Date Taking? Authorizing Provider  amLODipine (NORVASC) 10 MG tablet Take 10 mg by mouth daily.    Historical Provider, Stevenson  aspirin EC 81 MG tablet Take 81 mg by mouth daily.    Historical Provider, Stevenson  beclomethasone (QVAR) 80 MCG/ACT inhaler Inhale into the lungs 4 (four) times daily as needed.    Historical Provider, Stevenson  BYDUREON 2 MG PEN Inject 2 mg as directed once a week. 04/04/15   Historical Provider, Stevenson  ciprofloxacin (CIPRO) 500 MG tablet Take 1 tablet (500 mg total) by mouth 2 (two) times daily. 06/12/15   Gail Stevenson  cloNIDine (CATAPRES) 0.1 MG tablet Take 1 tablet by mouth 2 (two) times daily. 03/13/15   Historical Provider, Stevenson  glipiZIDE (GLUCOTROL) 10 MG tablet Take 10 mg by mouth 2 (two) times daily before a meal.    Historical Provider, Stevenson  hydrochlorothiazide (HYDRODIURIL) 25 MG tablet Take 25 mg by mouth daily.    Historical Provider, Stevenson  HYDROcodone-acetaminophen (NORCO/VICODIN) 5-325 MG tablet Take 1 tablet by mouth Stevenson 4 (four) hours as needed for moderate pain. 06/12/15   Gail Stevenson  lovastatin (MEVACOR) 40 MG tablet Take 40 mg by mouth at bedtime.    Historical Provider, Stevenson  metFORMIN (GLUCOPHAGE) 500 MG tablet Take 2-3 tablets by mouth 2 (two) times daily. 03/13/15   Historical Provider, Stevenson  metroNIDAZOLE (FLAGYL) 500 MG tablet Take 1 tablet (500 mg total) by mouth 2 (two) times daily after a meal. 06/12/15   Gail Stevenson  Gail Downs, Stevenson  Na Sulfate-K Sulfate-Mg Sulf (SUPREP BOWEL PREP) SOLN Take 1 kit by mouth as directed. 05/20/15   Gail Lame, Stevenson  potassium chloride (K-DUR) 10 MEQ tablet Take 1 tablet (10 mEq total) by mouth daily. 07/18/14   Gail Stevenson, Stevenson  PROAIR HFA 108 [phone] Base) MCG/ACT inhaler Inhale 2 puffs into the lungs 4 (four) times daily as needed. 03/13/15   Historical Provider, Stevenson  ramipril (ALTACE) 10 MG capsule Take 10 mg by mouth 2 (two) times daily.    Historical Provider, Stevenson  ranitidine (ZANTAC) 150 MG tablet Take 150 mg by mouth 2 (two) times daily.     Historical Provider, Stevenson  sitaGLIPtin (JANUVIA) 100 MG tablet Take 100 mg by mouth daily.    Historical Provider, Stevenson  triamcinolone ointment (KENALOG) 0.5 % Apply 1 application topically 2 (two) times daily. 09/20/15   Gail Stevenson    Allergies Patanol [olopatadine]  Family History  Problem Relation Age of Onset  . Gastric cancer Father   . Breast cancer Maternal Aunt     Social History Social History  Substance Use Topics  . Smoking status: Former Smoker    Quit date: 05/17/1992  . Smokeless tobacco: Never Used  . Alcohol use No    Review of Systems  Constitutional: Negative for fever. Cardiovascular: Negative for chest pain. Respiratory: Negative for shortness of breath. Gastrointestinal: Positive for abdominal pain, nausea and diarrhea. Genitourinary: Negative for dysuria. Musculoskeletal: Negative for back pain. Skin: Negative for rash. Neurological: Negative for headaches, focal weakness or numbness.  10-point ROS otherwise negative.  ____________________________________________   PHYSICAL EXAM:  VITAL SIGNS: ED Triage Vitals  Enc Vitals Group     BP 11/02/15 2102 (!) 142/67     Pulse Rate 11/02/15 2102 87     Resp 11/02/15 2102 18     Temp 11/02/15 2102 98.1 F (36.7 C)     Temp Source 11/02/15 2102 Oral     SpO2 11/02/15 2102 97 %     Weight 11/02/15 2103 179 lb (81.2 kg)     Height 11/02/15 2103 _0  (1.575 m)     Head Circumference --      Peak Flow --      Pain Score 11/02/15 2103 10   Constitutional: Alert and oriented. Well appearing and in no distress. Eyes: Conjunctivae are normal. Normal extraocular movements. ENT   Head: Normocephalic and atraumatic.   Nose: No congestion/rhinnorhea.   Mouth/Throat: Mucous membranes are moist.   Neck: No stridor. Hematological/Lymphatic/Immunilogical: No cervical lymphadenopathy. Cardiovascular: Normal rate, regular rhythm.  No murmurs, rubs, or gallops. Respiratory: Normal  respiratory effort without tachypnea nor retractions. Breath sounds are clear and equal bilaterally. No wheezes/rales/rhonchi. Gastrointestinal: Soft and diffusely tender to palpation without rebound or guarding. Vertical incision scar just inferior to the umbilicus.  Genitourinary: Deferred Musculoskeletal: Normal range of motion in all extremities. No lower extremity edema. Neurologic:  Normal speech and language. No gross focal neurologic deficits are appreciated.  Skin:  Skin is warm, dry and intact. No rash noted. Psychiatric: Mood and affect are normal. Speech and behavior are normal. Patient exhibits appropriate insight and judgment.  ____________________________________________    LABS (pertinent positives/negatives)  Labs Reviewed  CBC - Abnormal; Notable for the following:       Result Value   WBC 18.4 (*)    All other components within normal limits  COMPREHENSIVE METABOLIC PANEL - Abnormal; Notable for the following:    Glucose, Bld  148 (*)    BUN 21 (*)    Creatinine, Ser 1.18 (*)    GFR calc non Af Amer 49 (*)    GFR calc Af Amer 57 (*)    All other components within normal limits  URINALYSIS COMPLETEWITH MICROSCOPIC ([facility] ONLY) - Abnormal; Notable for the following:    Color, Urine YELLOW (*)    APPearance CLEAR (*)    Protein, ur 30 (*)    Leukocytes, UA 3+ (*)    Squamous Epithelial / LPF 0-5 (*)    All other components within normal limits  LIPASE, BLOOD     ____________________________________________   EKG  None  ____________________________________________    RADIOLOGY  CT abd/pel IMPRESSION:  No acute process demonstrated in the abdomen or pelvis. No evidence  of bowel obstruction or inflammation. Colonic diverticulosis without  evidence of diverticulitis.     ____________________________________________   PROCEDURES  Procedures  ____________________________________________   INITIAL IMPRESSION / ASSESSMENT AND PLAN / ED  COURSE  Pertinent labs & imaging results that were available during my care of the patient were reviewed by me and considered in my medical decision making (see chart for details).  Patient with abdominal pain. History of diverticulitis in the past requiring IV antibiotics. Will get CT abd/pel  Clinical Course   CT abdomen and pelvis without concerning findings. Patient's urine does have evidence of a urinary tract infection. I think this could explain the patient's symptoms. Will give dose of IV antibiotics here in the emergency department and discharged with prescription for further antibiotics. ____________________________________________   FINAL CLINICAL IMPRESSION(S) / ED DIAGNOSES  Final diagnoses:  Generalized abdominal pain  UTI (lower urinary tract infection)     Note: This dictation was prepared with Dragon dictation. Any transcriptional errors that result from this process are unintentional    Nance Pear, Stevenson 11/03/15 [phone]

## 2015-11-03 ENCOUNTER — Encounter: Payer: Self-pay | Admitting: Radiology

## 2015-11-03 ENCOUNTER — Emergency Department: Payer: Medicaid Other

## 2015-11-03 MED ORDER — CEPHALEXIN 250 MG/5ML PO SUSR: 500.0000 mg | Freq: Three times a day (TID) | ORAL | 0 refills | Status: AC

## 2015-11-03 MED ORDER — DEXTROSE 5 % IV SOLN
1.0000 g | Freq: Once | INTRAVENOUS | Status: AC
  Administered 2015-11-03: 1 g via INTRAVENOUS
  Filled 2015-11-03: qty 10

## 2015-11-03 MED ORDER — IOPAMIDOL (ISOVUE-300) INJECTION 61%
100.0000 mL | Freq: Once | INTRAVENOUS | Status: AC | PRN
  Administered 2015-11-03: 100 mL via INTRAVENOUS

## 2015-11-03 NOTE — ED Notes (Signed)
Made CT aware that patient has almost finished contrast

## 2015-11-03 NOTE — Discharge Instructions (Signed)
Please seek medical attention for any high fevers, chest pain, shortness of breath, change in behavior, persistent vomiting, bloody stool or any other new or concerning symptoms.  

## 2015-11-18 ENCOUNTER — Emergency Department
Admission: EM | Admit: 2015-11-18 | Discharge: 2015-11-19 | Disposition: A | Discharge status: Home / Self Care | Payer: Medicaid Other | Attending: Emergency Medicine | Admitting: Emergency Medicine

## 2015-11-18 ENCOUNTER — Emergency Department: Payer: Medicaid Other

## 2015-11-18 DIAGNOSIS — Z87891 Personal history of nicotine dependence: Secondary | ICD-10-CM | POA: Diagnosis not present

## 2015-11-18 DIAGNOSIS — I10 Essential (primary) hypertension: Secondary | ICD-10-CM | POA: Insufficient documentation

## 2015-11-18 DIAGNOSIS — E11649 Type 2 diabetes mellitus with hypoglycemia without coma: Secondary | ICD-10-CM | POA: Diagnosis present

## 2015-11-18 DIAGNOSIS — Z853 Personal history of malignant neoplasm of breast: Secondary | ICD-10-CM | POA: Diagnosis not present

## 2015-11-18 DIAGNOSIS — Z7982 Long term (current) use of aspirin: Secondary | ICD-10-CM | POA: Diagnosis not present

## 2015-11-18 DIAGNOSIS — Z79899 Other long term (current) drug therapy: Secondary | ICD-10-CM | POA: Insufficient documentation

## 2015-11-18 DIAGNOSIS — R0789 Other chest pain: Secondary | ICD-10-CM | POA: Diagnosis not present

## 2015-11-18 DIAGNOSIS — R079 Chest pain, unspecified: Secondary | ICD-10-CM

## 2015-11-18 DIAGNOSIS — Z7984 Long term (current) use of oral hypoglycemic drugs: Secondary | ICD-10-CM | POA: Insufficient documentation

## 2015-11-18 DIAGNOSIS — J45909 Unspecified asthma, uncomplicated: Secondary | ICD-10-CM | POA: Insufficient documentation

## 2015-11-18 DIAGNOSIS — E162 Hypoglycemia, unspecified: Secondary | ICD-10-CM

## 2015-11-18 LAB — CBC
HEMATOCRIT: 42.1 % (ref 35.0–47.0)
Hemoglobin: 14.3 g/dL (ref 12.0–16.0)
MCH: 29.9 pg (ref 26.0–34.0)
MCHC: 33.9 g/dL (ref 32.0–36.0)
MCV: 88.2 fL (ref 80.0–100.0)
Platelets: 435 10*3/uL (ref 150–440)
RBC: 4.78 MIL/uL (ref 3.80–5.20)
RDW: 13.8 % (ref 11.5–14.5)
WBC: 18.5 10*3/uL — AB (ref 3.6–11.0)

## 2015-11-18 LAB — BASIC METABOLIC PANEL
Anion gap: 10 (ref 5–15)
BUN: 10 mg/dL (ref 6–20)
CALCIUM: 9.3 mg/dL (ref 8.9–10.3)
CO2: 27 mmol/L (ref 22–32)
Chloride: 103 mmol/L (ref 101–111)
Creatinine, Ser: 0.94 mg/dL (ref 0.44–1.00)
GLUCOSE: 132 mg/dL — AB (ref 65–99)
POTASSIUM: 3 mmol/L — AB (ref 3.5–5.1)
SODIUM: 140 mmol/L (ref 135–145)

## 2015-11-18 LAB — GLUCOSE, CAPILLARY: GLUCOSE-CAPILLARY: 57 mg/dL — AB (ref 65–99)

## 2015-11-18 LAB — TROPONIN I: Troponin I: <0.03 ng/mL (ref <0.03)

## 2015-11-18 MED ORDER — KETOROLAC TROMETHAMINE 30 MG/ML IJ SOLN
60.0000 mg | Freq: Once | INTRAMUSCULAR | Status: AC
  Administered 2015-11-19: 60 mg via INTRAMUSCULAR
  Filled 2015-11-18: qty 2

## 2015-11-18 NOTE — ED Notes (Signed)
Pt c/o left-sided chest pain beginning at 1500, that has moved to medial chest pain. Pt reports radiation to left arm, neck and back. Pt reports associated symptoms of SOB, nausea, lightheaded/dizziness. Pt denies weakness and diaphoresis

## 2015-11-18 NOTE — ED Triage Notes (Signed)
Pt reports left sided chest pain, cough, diarrhea and nausea. Reports PMH of asthma and bronchitis.  Pt currently has dry unproductive cough.

## 2015-11-18 NOTE — ED Provider Notes (Signed)
[facility] Emergency Department Provider Note   ____________________________________________   First MD Initiated Contact with Patient 11/18/15 2341     (approximate)  I have reviewed the triage vital signs and the nursing notes.   HISTORY  Chief Complaint Chest Pain; Diarrhea; and Nausea    HPI Gail Stevenson is a 60 y.o. female who presents to the ED from home with a chief complaint of chest pain, dry cough, diarrhea and nausea. History of diabetes, hypertension, hepatitis C; no history of CAD. Reports intermittent central chest tightness all day. Denies associated diaphoresis, vomiting, dizziness. States she was already having nausea and diarrhea. Complains of dry cough without shortness of breath. Denies associated fever, chills, abdominal pain, dysuria. Denies recent travel or trauma. Nothing makes her pain better. Movement makes her pain worse.   Past Medical History:  Diagnosis Date  . Asthma   . Cancer [facility]) 2003   right side total mastectomy  . Diabetes mellitus without complication ([place])   . Diastolic dysfunction   . Diverticulitis   . Hemorrhoids   . Hepatitis C   . Hypertension   . Trigeminal neuralgia     Patient Active Problem List   Diagnosis Date Noted  . Diverticulitis of colon with perforation   . Sepsis ([place])   . Type 2 diabetes mellitus without complication ([place])   . Essential hypertension   . Malignant neoplasm of female breast ([place])   . Morbid obesity ([place]) 04/02/2015  . Chest pain 04/02/2015  . Lactose intolerance 10/30/2014  . Fothergill's neuralgia 09/21/2014  . Left ventricular hypertrophy 02/16/2014  . Diastolic dysfunction 08/23/1973  . Reflux 01/19/2014  . Asthma, moderate persistent 11/27/2013  . Clinical depression 11/30/2004  . Chronic hepatitis C virus infection ([place]) 04/26/2004    Past Surgical History:  Procedure Laterality Date  . BREAST SURGERY Right 2003   total mastectomy  . MASTECTOMY Right  2003   total    Prior to Admission medications   Medication Sig Start Date End Date Taking? Authorizing Provider  amLODipine (NORVASC) 10 MG tablet Take 10 mg by mouth daily.    Historical Provider, MD  aspirin EC 81 MG tablet Take 81 mg by mouth daily.    Historical Provider, MD  beclomethasone (QVAR) 80 MCG/ACT inhaler Inhale into the lungs 4 (four) times daily as needed.    Historical Provider, MD  BYDUREON 2 MG PEN Inject 2 mg as directed once a week. 04/04/15   Historical Provider, MD  ciprofloxacin (CIPRO) 500 MG tablet Take 1 tablet (500 mg total) by mouth 2 (two) times daily. 06/12/15   Lavonia Drafts, MD  cloNIDine (CATAPRES) 0.1 MG tablet Take 1 tablet by mouth 2 (two) times daily. 03/13/15   Historical Provider, MD  glipiZIDE (GLUCOTROL) 10 MG tablet Take 10 mg by mouth 2 (two) times daily before a meal.    Historical Provider, MD  hydrochlorothiazide (HYDRODIURIL) 25 MG tablet Take 25 mg by mouth daily.    Historical Provider, MD  HYDROcodone-acetaminophen (NORCO/VICODIN) 5-325 MG tablet Take 1 tablet by mouth every 4 (four) hours as needed for moderate pain. 06/12/15   Lavonia Drafts, MD  lovastatin (MEVACOR) 40 MG tablet Take 40 mg by mouth at bedtime.    Historical Provider, MD  metFORMIN (GLUCOPHAGE) 500 MG tablet Take 2-3 tablets by mouth 2 (two) times daily. 03/13/15   Historical Provider, MD  metroNIDAZOLE (FLAGYL) 500 MG tablet Take 1 tablet (500 mg total) by mouth 2 (two) times daily after a  meal. 06/12/15   Lavonia Drafts, MD  Na Sulfate-K Sulfate-Mg Sulf (SUPREP BOWEL PREP) SOLN Take 1 kit by mouth as directed. 05/20/15   Lucilla Lame, MD  potassium chloride (K-DUR) 10 MEQ tablet Take 1 tablet (10 mEq total) by mouth daily. 07/18/14   Johnn Hai, PA-C  PROAIR HFA 108 [phone] Base) MCG/ACT inhaler Inhale 2 puffs into the lungs 4 (four) times daily as needed. 03/13/15   Historical Provider, MD  ramipril (ALTACE) 10 MG capsule Take 10 mg by mouth 2 (two) times daily.    Historical  Provider, MD  ranitidine (ZANTAC) 150 MG tablet Take 150 mg by mouth 2 (two) times daily.    Historical Provider, MD  sitaGLIPtin (JANUVIA) 100 MG tablet Take 100 mg by mouth daily.    Historical Provider, MD  triamcinolone ointment (KENALOG) 0.5 % Apply 1 application topically 2 (two) times daily. 09/20/15   [name], PA-C    Allergies Patanol [olopatadine]  Family History  Problem Relation Age of Onset  . Gastric cancer Father   . Breast cancer Maternal Aunt     Social History Social History  Substance Use Topics  . Smoking status: Former Smoker    Quit date: 05/17/1992  . Smokeless tobacco: Never Used  . Alcohol use No    Review of Systems  Constitutional: No fever/chills. Eyes: No visual changes. ENT: No sore throat. Cardiovascular: Positive for chest pain. Respiratory: Denies shortness of breath. Gastrointestinal: No abdominal pain.  Positive for nausea, no vomiting.  No diarrhea.  No constipation. Genitourinary: Negative for dysuria. Musculoskeletal: Negative for back pain. Skin: Negative for rash. Neurological: Negative for headaches, focal weakness or numbness.  10-point ROS otherwise negative.  ____________________________________________   PHYSICAL EXAM:  VITAL SIGNS: ED Triage Vitals  Enc Vitals Group     BP 11/18/15 1712 (!) 146/72     Pulse Rate 11/18/15 1712 88     Resp 11/18/15 1712 16     Temp 11/18/15 1712 98.2 F (36.8 C)     Temp Source 11/18/15 1712 Oral     SpO2 11/18/15 1712 99 %     Weight 11/18/15 1715 180 lb (81.6 kg)     Height 11/18/15 1715 _0  (1.6 m)     Head Circumference --      Peak Flow --      Pain Score 11/18/15 1715 8     Pain Loc --      Pain Edu? --      Excl. in [place]? --     Constitutional: Alert and oriented. Well appearing and in no acute distress. Eyes: Conjunctivae are normal. PERRL. EOMI. Head: Atraumatic. Nose: No congestion/rhinnorhea. Mouth/Throat: Mucous membranes are moist.  Oropharynx  non-erythematous. Neck: No stridor.   Cardiovascular: Normal rate, regular rhythm. Grossly normal heart sounds.  Good peripheral circulation. Respiratory: Normal respiratory effort.  No retractions. Lungs CTAB. Anterior chest wall tender to palpation and with movement. Gastrointestinal: Soft and nontender to light and deep palpation. No distention. No abdominal bruits. No CVA tenderness. Musculoskeletal: No lower extremity tenderness nor edema.  No joint effusions. Neurologic:  Normal speech and language. No gross focal neurologic deficits are appreciated. No gait instability. Skin:  Skin is warm, dry and intact. No rash noted. Psychiatric: Mood and affect are normal. Speech and behavior are normal.  ____________________________________________   LABS (all labs ordered are listed, but only abnormal results are displayed)  Labs Reviewed  BASIC METABOLIC PANEL - Abnormal; Notable for the  following:       Result Value   Potassium 3.0 (*)    Glucose, Bld 132 (*)    All other components within normal limits  CBC - Abnormal; Notable for the following:    WBC 18.5 (*)    All other components within normal limits  GLUCOSE, CAPILLARY - Abnormal; Notable for the following:    Glucose-Capillary 57 (*)    All other components within normal limits  TROPONIN I  TROPONIN I   ____________________________________________  EKG  ED ECG REPORT I, SUNG,JADE J, the attending physician, personally viewed and interpreted this ECG.   Date: 11/18/2015  EKG Time: 1714  Rate: 92  Rhythm: normal EKG, normal sinus rhythm  Axis: normal  Intervals:none  ST&T Change: nonspecific  ____________________________________________  RADIOLOGY  Chest 2 view (viewed by me, interpreted per Dr. Randel Pigg): No active cardiopulmonary disease. ____________________________________________   PROCEDURES  Procedure(s) performed: None  Procedures  Critical Care performed:  No  ____________________________________________   INITIAL IMPRESSION / ASSESSMENT AND PLAN / ED COURSE  Pertinent labs & imaging results that were available during my care of the patient were reviewed by me and considered in my medical decision making (see chart for details).  60 year old female who presents with chest tightness; 2 sets of negative troponin with reproducible chest pain. Patient had a very long wait in the emergency department and had not eaten all day. Her blood sugar is currently 57 and she is feeling tremulous. Will feed patient and stabilize her blood sugar prior to discharge. Noted leukocytosis which patient had approximately 2 weeks ago. Potassium was repleted on this visit. Strict return precautions given. Patient and family member verbalize understanding and agreed with plan of care.  Clinical Course     ____________________________________________   FINAL CLINICAL IMPRESSION(S) / ED DIAGNOSES  Final diagnoses:  Chest wall pain  Chest pain, unspecified type  Hypoglycemia      NEW MEDICATIONS STARTED DURING THIS VISIT:  New Prescriptions   No medications on file     Note:  This document was prepared using Dragon voice recognition software and may include unintentional dictation errors.    Paulette Blanch, MD 11/19/15 [phone]

## 2015-11-19 LAB — GLUCOSE, CAPILLARY
Glucose-Capillary: 121 mg/dL — ABNORMAL HIGH (ref 65–99)
Glucose-Capillary: 72 mg/dL (ref 65–99)

## 2015-11-19 MED ORDER — POTASSIUM CHLORIDE CRYS ER 20 MEQ PO TBCR
40.0000 meq | EXTENDED_RELEASE_TABLET | Freq: Once | ORAL | Status: AC
  Administered 2015-11-19: 40 meq via ORAL
  Filled 2015-11-19: qty 2

## 2015-11-19 NOTE — ED Notes (Signed)
Reviewed d/c instructions, follow-up care and with pt. Pt verbalized understanding

## 2015-11-19 NOTE — Discharge Instructions (Signed)
1. Apply moist heat to chest wall several times daily. 2. You may take Tylenol and/or ibuprofen as needed for discomfort. 3. You may take your insulin shot tomorrow. 4. Return to the ER for worsening symptoms, persistent vomiting, difficulty breathing or other concerns.

## 2015-11-19 NOTE — ED Notes (Signed)
Pt brought meal and orange juice

## 2016-01-03 ENCOUNTER — Emergency Department: Payer: Medicaid Other

## 2016-01-03 ENCOUNTER — Emergency Department
Admission: EM | Admit: 2016-01-03 | Discharge: 2016-01-03 | Disposition: A | Discharge status: Home / Self Care | Payer: Medicaid Other | Attending: Emergency Medicine | Admitting: Emergency Medicine

## 2016-01-03 DIAGNOSIS — Z7982 Long term (current) use of aspirin: Secondary | ICD-10-CM | POA: Diagnosis not present

## 2016-01-03 DIAGNOSIS — R112 Nausea with vomiting, unspecified: Secondary | ICD-10-CM | POA: Diagnosis not present

## 2016-01-03 DIAGNOSIS — E119 Type 2 diabetes mellitus without complications: Secondary | ICD-10-CM | POA: Diagnosis not present

## 2016-01-03 DIAGNOSIS — Z87891 Personal history of nicotine dependence: Secondary | ICD-10-CM | POA: Diagnosis not present

## 2016-01-03 DIAGNOSIS — J069 Acute upper respiratory infection, unspecified: Secondary | ICD-10-CM

## 2016-01-03 DIAGNOSIS — R05 Cough: Secondary | ICD-10-CM | POA: Diagnosis present

## 2016-01-03 DIAGNOSIS — Z7984 Long term (current) use of oral hypoglycemic drugs: Secondary | ICD-10-CM | POA: Diagnosis not present

## 2016-01-03 DIAGNOSIS — Z853 Personal history of malignant neoplasm of breast: Secondary | ICD-10-CM | POA: Diagnosis not present

## 2016-01-03 DIAGNOSIS — I110 Hypertensive heart disease with heart failure: Secondary | ICD-10-CM | POA: Insufficient documentation

## 2016-01-03 DIAGNOSIS — Z79899 Other long term (current) drug therapy: Secondary | ICD-10-CM | POA: Diagnosis not present

## 2016-01-03 DIAGNOSIS — I5030 Unspecified diastolic (congestive) heart failure: Secondary | ICD-10-CM | POA: Insufficient documentation

## 2016-01-03 DIAGNOSIS — J45909 Unspecified asthma, uncomplicated: Secondary | ICD-10-CM | POA: Insufficient documentation

## 2016-01-03 DIAGNOSIS — R197 Diarrhea, unspecified: Secondary | ICD-10-CM | POA: Insufficient documentation

## 2016-01-03 DIAGNOSIS — B9789 Other viral agents as the cause of diseases classified elsewhere: Secondary | ICD-10-CM

## 2016-01-03 LAB — CBC
HCT: 42.7 % (ref 35.0–47.0)
HEMOGLOBIN: 14.3 g/dL (ref 12.0–16.0)
MCH: 30.1 pg (ref 26.0–34.0)
MCHC: 33.6 g/dL (ref 32.0–36.0)
MCV: 89.6 fL (ref 80.0–100.0)
PLATELETS: 397 10*3/uL (ref 150–440)
RBC: 4.76 MIL/uL (ref 3.80–5.20)
RDW: 13 % (ref 11.5–14.5)
WBC: 18.1 10*3/uL — ABNORMAL HIGH (ref 3.6–11.0)

## 2016-01-03 LAB — COMPREHENSIVE METABOLIC PANEL
ALK PHOS: 92 U/L (ref 38–126)
ALT: 30 U/L (ref 14–54)
ANION GAP: 6 (ref 5–15)
AST: 25 U/L (ref 15–41)
Albumin: 3.8 g/dL (ref 3.5–5.0)
BILIRUBIN TOTAL: 0.6 mg/dL (ref 0.3–1.2)
BUN: 10 mg/dL (ref 6–20)
CALCIUM: 8.7 mg/dL — AB (ref 8.9–10.3)
CO2: 32 mmol/L (ref 22–32)
CREATININE: 0.88 mg/dL (ref 0.44–1.00)
Chloride: 101 mmol/L (ref 101–111)
GFR calc non Af Amer: >60 mL/min (ref >60)
GLUCOSE: 166 mg/dL — AB (ref 65–99)
Potassium: 3.6 mmol/L (ref 3.5–5.1)
Sodium: 139 mmol/L (ref 135–145)
TOTAL PROTEIN: 8.2 g/dL — AB (ref 6.5–8.1)

## 2016-01-03 LAB — POCT RAPID STREP A: STREPTOCOCCUS, GROUP A SCREEN (DIRECT): NEGATIVE

## 2016-01-03 LAB — URINALYSIS COMPLETE WITH MICROSCOPIC (ARMC ONLY)
BILIRUBIN URINE: NEGATIVE
Bacteria, UA: NONE SEEN
GLUCOSE, UA: NEGATIVE mg/dL
HGB URINE DIPSTICK: NEGATIVE
KETONES UR: NEGATIVE mg/dL
NITRITE: NEGATIVE
Protein, ur: NEGATIVE mg/dL
SPECIFIC GRAVITY, URINE: 1.008 (ref 1.005–1.030)
pH: 7 (ref 5.0–8.0)

## 2016-01-03 LAB — INFLUENZA PANEL BY PCR (TYPE A & B)
Influenza A By PCR: NEGATIVE
Influenza B By PCR: NEGATIVE

## 2016-01-03 LAB — LIPASE, BLOOD: Lipase: 142 U/L — ABNORMAL HIGH (ref 11–51)

## 2016-01-03 MED ORDER — ONDANSETRON 4 MG PO TBDP: 4.0000 mg | ORAL_TABLET | Freq: Three times a day (TID) | ORAL | 0 refills | Status: DC | PRN

## 2016-01-03 MED ORDER — IPRATROPIUM-ALBUTEROL 0.5-2.5 (3) MG/3ML IN SOLN
3.0000 mL | Freq: Once | RESPIRATORY_TRACT | Status: AC
  Administered 2016-01-03: 3 mL via RESPIRATORY_TRACT
  Filled 2016-01-03: qty 3

## 2016-01-03 MED ORDER — SODIUM CHLORIDE 0.9 % IV BOLUS (SEPSIS)
1000.0000 mL | Freq: Once | INTRAVENOUS | Status: AC
  Administered 2016-01-03: 1000 mL via INTRAVENOUS

## 2016-01-03 MED ORDER — KETOROLAC TROMETHAMINE 30 MG/ML IJ SOLN
30.0000 mg | Freq: Once | INTRAMUSCULAR | Status: AC
  Administered 2016-01-03: 30 mg via INTRAVENOUS
  Filled 2016-01-03: qty 1

## 2016-01-03 MED ORDER — DEXAMETHASONE SODIUM PHOSPHATE 4 MG/ML IJ SOLN
10.0000 mg | Freq: Once | INTRAMUSCULAR | Status: AC
  Administered 2016-01-03: 10 mg via INTRAVENOUS
  Filled 2016-01-03: qty 1
  Filled 2016-01-03: qty 3

## 2016-01-03 NOTE — ED Provider Notes (Signed)
[facility] Emergency Department Provider Note  ____________________________________________  Time seen: Approximately 12:39 PM  I have reviewed the triage vital signs and the nursing notes.   HISTORY  Chief Complaint Emesis and Diarrhea   HPI Kathlen Sakurai is a 60 y.o. female history of remote breast cancer in 5188, diastolic CHF, asthma, hypertension presents for evaluation of URI symptoms. Patient reports 3 days of cough productive of clear phlegm, sore throat, congestion, nausea, nonbloody nonbilious emesis, and diarrhea. She endorses chills and body aches. She is also complaining of mild intermittent wheezing which is responsive to her inhalers. No shortness of breath or respiratory distress. No wheezing or chest pain at this time. No abdominal pain, no dysuria or hematuria, no fevers. No known sick contact exposures. Patient's vaccines including flu are up to date.  Past Medical History:  Diagnosis Date  . Asthma   . Cancer [facility]) 2003   right side total mastectomy  . Diabetes mellitus without complication ([place])   . Diastolic dysfunction   . Diverticulitis   . Hemorrhoids   . Hepatitis C   . Hypertension   . Trigeminal neuralgia     Patient Active Problem List   Diagnosis Date Noted  . Diverticulitis of colon with perforation   . Sepsis ([place])   . Type 2 diabetes mellitus without complication ([place])   . Essential hypertension   . Malignant neoplasm of female breast ([place])   . Morbid obesity ([place]) 04/02/2015  . Chest pain 04/02/2015  . Lactose intolerance 10/30/2014  . Fothergill's neuralgia 09/21/2014  . Left ventricular hypertrophy 02/16/2014  . Diastolic dysfunction 41/66/0630  . Reflux 01/19/2014  . Asthma, moderate persistent 11/27/2013  . Clinical depression 11/30/2004  . Chronic hepatitis C virus infection ([place]) 04/26/2004    Past Surgical History:  Procedure Laterality Date  . BREAST SURGERY Right 2003   total mastectomy  .  MASTECTOMY Right 2003   total    Prior to Admission medications   Medication Sig Start Date End Date Taking? Authorizing Provider  amLODipine (NORVASC) 10 MG tablet Take 10 mg by mouth daily.    Historical Provider, MD  aspirin EC 81 MG tablet Take 81 mg by mouth daily.    Historical Provider, MD  beclomethasone (QVAR) 80 MCG/ACT inhaler Inhale into the lungs 4 (four) times daily as needed.    Historical Provider, MD  BYDUREON 2 MG PEN Inject 2 mg as directed once a week. 04/04/15   Historical Provider, MD  ciprofloxacin (CIPRO) 500 MG tablet Take 1 tablet (500 mg total) by mouth 2 (two) times daily. 06/12/15   Lavonia Drafts, MD  cloNIDine (CATAPRES) 0.1 MG tablet Take 1 tablet by mouth 2 (two) times daily. 03/13/15   Historical Provider, MD  glipiZIDE (GLUCOTROL) 10 MG tablet Take 10 mg by mouth 2 (two) times daily before a meal.    Historical Provider, MD  hydrochlorothiazide (HYDRODIURIL) 25 MG tablet Take 25 mg by mouth daily.    Historical Provider, MD  HYDROcodone-acetaminophen (NORCO/VICODIN) 5-325 MG tablet Take 1 tablet by mouth every 4 (four) hours as needed for moderate pain. 06/12/15   Lavonia Drafts, MD  lovastatin (MEVACOR) 40 MG tablet Take 40 mg by mouth at bedtime.    Historical Provider, MD  metFORMIN (GLUCOPHAGE) 500 MG tablet Take 2-3 tablets by mouth 2 (two) times daily. 03/13/15   Historical Provider, MD  metroNIDAZOLE (FLAGYL) 500 MG tablet Take 1 tablet (500 mg total) by mouth 2 (two) times daily after a  meal. 06/12/15   Lavonia Drafts, MD  Na Sulfate-K Sulfate-Mg Sulf (SUPREP BOWEL PREP) SOLN Take 1 kit by mouth as directed. 05/20/15   Lucilla Lame, MD  ondansetron (ZOFRAN ODT) 4 MG disintegrating tablet Take 1 tablet (4 mg total) by mouth every 8 (eight) hours as needed for nausea or vomiting. 01/03/16   Rudene Re, MD  potassium chloride (K-DUR) 10 MEQ tablet Take 1 tablet (10 mEq total) by mouth daily. 07/18/14   Johnn Hai, PA-C  PROAIR HFA 108 [phone] Base) MCG/ACT  inhaler Inhale 2 puffs into the lungs 4 (four) times daily as needed. 03/13/15   Historical Provider, MD  ramipril (ALTACE) 10 MG capsule Take 10 mg by mouth 2 (two) times daily.    Historical Provider, MD  ranitidine (ZANTAC) 150 MG tablet Take 150 mg by mouth 2 (two) times daily.    Historical Provider, MD  sitaGLIPtin (JANUVIA) 100 MG tablet Take 100 mg by mouth daily.    Historical Provider, MD  triamcinolone ointment (KENALOG) 0.5 % Apply 1 application topically 2 (two) times daily. 09/20/15   [name], PA-C    Allergies Patanol [olopatadine]  Family History  Problem Relation Age of Onset  . Gastric cancer Father   . Breast cancer Maternal Aunt     Social History Social History  Substance Use Topics  . Smoking status: Former Smoker    Quit date: 05/17/1992  . Smokeless tobacco: Never Used  . Alcohol use No    Review of Systems  Constitutional: Negative for fever. + chills and body aches Eyes: Negative for visual changes. ENT: + sore throat. Neck: No neck pain  Cardiovascular: Negative for chest pain. Respiratory: + cough. No for shortness of breath. Gastrointestinal: Negative for abdominal pain. +N/ vomiting and diarrhea. Genitourinary: Negative for dysuria. Musculoskeletal: Negative for back pain. Skin: Negative for rash. Neurological: Negative for headaches, weakness or numbness. Psych: No SI or HI  ____________________________________________   PHYSICAL EXAM:  VITAL SIGNS: ED Triage Vitals  Enc Vitals Group     BP 01/03/16 1007 (!) 153/67     Pulse Rate 01/03/16 1007 90     Resp 01/03/16 1007 18     Temp 01/03/16 1007 97.9 F (36.6 C)     Temp Source 01/03/16 1007 Oral     SpO2 01/03/16 1007 96 %     Weight 01/03/16 1008 178 lb (80.7 kg)     Height 01/03/16 1008 '5\' 2"'$  (1.575 m)     Head Circumference --      Peak Flow --      Pain Score 01/03/16 1008 10     Pain Loc --      Pain Edu? --      Excl. in [place]? --     Constitutional: Alert  and oriented. Well appearing and in no apparent distress. HEENT:      Head: Normocephalic and atraumatic.         Eyes: Conjunctivae are normal. Sclera is non-icteric. EOMI. PERRL      Mouth/Throat: Mucous membranes are moist. Hoarse voice      Neck: Supple with no signs of meningismus. Cardiovascular: Regular rate and rhythm. No murmurs, gallops, or rubs. 2+ symmetrical distal pulses are present in all extremities. No JVD. Respiratory: Normal respiratory effort. Lungs are clear to auscultation bilaterally. No wheezes, crackles, or rhonchi.  Gastrointestinal: Soft, non tender, and non distended with positive bowel sounds. No rebound or guarding. Genitourinary: No CVA tenderness. Musculoskeletal: Nontender with  normal range of motion in all extremities. No edema, cyanosis, or erythema of extremities. Neurologic: Normal speech and language. Face is symmetric. Moving all extremities. No gross focal neurologic deficits are appreciated. Skin: Skin is warm, dry and intact. No rash noted. Psychiatric: Mood and affect are normal. Speech and behavior are normal.  ____________________________________________   LABS (all labs ordered are listed, but only abnormal results are displayed)  Labs Reviewed  LIPASE, BLOOD - Abnormal; Notable for the following:       Result Value   Lipase 142 (*)    All other components within normal limits  COMPREHENSIVE METABOLIC PANEL - Abnormal; Notable for the following:    Glucose, Bld 166 (*)    Calcium 8.7 (*)    Total Protein 8.2 (*)    All other components within normal limits  CBC - Abnormal; Notable for the following:    WBC 18.1 (*)    All other components within normal limits  URINALYSIS COMPLETEWITH MICROSCOPIC ([facility] ONLY) - Abnormal; Notable for the following:    Color, Urine STRAW (*)    APPearance CLEAR (*)    Leukocytes, UA 1+ (*)    Squamous Epithelial / LPF 0-5 (*)    All other components within normal limits  INFLUENZA PANEL BY PCR (TYPE A  & B, H1N1)  POCT RAPID STREP A   ____________________________________________  EKG  none ____________________________________________  RADIOLOGY  CXR:  No active cardiopulmonary disease ____________________________________________   PROCEDURES  Procedure(s) performed: None Procedures Critical Care performed:  None ____________________________________________   INITIAL IMPRESSION / ASSESSMENT AND PLAN / ED COURSE   60 y.o. female history of remote breast cancer in 3235, diastolic CHF, asthma, hypertension presents for evaluation of URI symptoms x 3 days.  patients well appearing, no distress, has normal vital signs, lungs are clear to auscultation with good air movement and no wheezing, oropharynx is clear with no exudates, peritonsillar abscess, or erythema. No meningeal signs. Abdomen is soft and nontender. Plan for IV fluids and IV Toradol for symptom relief. We'll give her Decadron for sore throat. We'll do a strep swab, chest x-ray, flu swab, and basic labs.  Clinical Course as of Jan 03 1436  Tue Jan 03, 2016  1404 Flu, strep, chest x-ray, UA, blood work with no acute findings. Patient feels markedly improved at this time. We'll discharge home with Zofran, supportive care, follow up with primary care doctor.  [CV]    Clinical Course User Index [CV] Rudene Re, MD    Pertinent labs & imaging results that were available during my care of the patient were reviewed by me and considered in my medical decision making (see chart for details).    ____________________________________________   FINAL CLINICAL IMPRESSION(S) / ED DIAGNOSES  Final diagnoses:  Viral URI with cough  Nausea vomiting and diarrhea      NEW MEDICATIONS STARTED DURING THIS VISIT:  New Prescriptions   ONDANSETRON (ZOFRAN ODT) 4 MG DISINTEGRATING TABLET    Take 1 tablet (4 mg total) by mouth every 8 (eight) hours as needed for nausea or vomiting.     Note:  This document was prepared  using Dragon voice recognition software and may include unintentional dictation errors.    Rudene Re, MD 01/03/16 [phone]

## 2016-01-03 NOTE — Discharge Instructions (Signed)

## 2016-01-03 NOTE — ED Triage Notes (Signed)
Pt c/o sore throat with cough, congestion , N/V/D for the past 3 days.Marland Kitchen

## 2016-09-08 ENCOUNTER — Encounter: Payer: Self-pay | Admitting: *Deleted

## 2016-09-08 ENCOUNTER — Other Ambulatory Visit: Payer: Self-pay

## 2016-09-08 ENCOUNTER — Emergency Department: Payer: Medicaid Other

## 2016-09-08 ENCOUNTER — Emergency Department
Admission: EM | Admit: 2016-09-08 | Discharge: 2016-09-08 | Disposition: A | Discharge status: Home / Self Care | Payer: Medicaid Other | Attending: Emergency Medicine | Admitting: Emergency Medicine

## 2016-09-08 DIAGNOSIS — Z7984 Long term (current) use of oral hypoglycemic drugs: Secondary | ICD-10-CM | POA: Diagnosis not present

## 2016-09-08 DIAGNOSIS — Z87891 Personal history of nicotine dependence: Secondary | ICD-10-CM | POA: Insufficient documentation

## 2016-09-08 DIAGNOSIS — J45909 Unspecified asthma, uncomplicated: Secondary | ICD-10-CM | POA: Diagnosis not present

## 2016-09-08 DIAGNOSIS — I10 Essential (primary) hypertension: Secondary | ICD-10-CM | POA: Insufficient documentation

## 2016-09-08 DIAGNOSIS — Z79899 Other long term (current) drug therapy: Secondary | ICD-10-CM | POA: Insufficient documentation

## 2016-09-08 DIAGNOSIS — Z7982 Long term (current) use of aspirin: Secondary | ICD-10-CM | POA: Diagnosis not present

## 2016-09-08 DIAGNOSIS — E119 Type 2 diabetes mellitus without complications: Secondary | ICD-10-CM | POA: Diagnosis not present

## 2016-09-08 DIAGNOSIS — R079 Chest pain, unspecified: Secondary | ICD-10-CM | POA: Diagnosis present

## 2016-09-08 LAB — CBC
HEMATOCRIT: 38.2 % (ref 35.0–47.0)
HEMOGLOBIN: 13.1 g/dL (ref 12.0–16.0)
MCH: 30.2 pg (ref 26.0–34.0)
MCHC: 34.3 g/dL (ref 32.0–36.0)
MCV: 88 fL (ref 80.0–100.0)
Platelets: 387 10*3/uL (ref 150–440)
RBC: 4.34 MIL/uL (ref 3.80–5.20)
RDW: 13.2 % (ref 11.5–14.5)
WBC: 16.6 10*3/uL — AB (ref 3.6–11.0)

## 2016-09-08 LAB — TROPONIN I: Troponin I: <0.03 ng/mL (ref <0.03)

## 2016-09-08 LAB — BASIC METABOLIC PANEL
ANION GAP: 8 (ref 5–15)
BUN: 16 mg/dL (ref 6–20)
CO2: 29 mmol/L (ref 22–32)
Calcium: 9.2 mg/dL (ref 8.9–10.3)
Chloride: 103 mmol/L (ref 101–111)
Creatinine, Ser: 1.06 mg/dL — ABNORMAL HIGH (ref 0.44–1.00)
GFR, EST NON AFRICAN AMERICAN: 55 mL/min — AB (ref >60)
GLUCOSE: 211 mg/dL — AB (ref 65–99)
POTASSIUM: 3.2 mmol/L — AB (ref 3.5–5.1)
Sodium: 140 mmol/L (ref 135–145)

## 2016-09-08 MED ORDER — POTASSIUM CHLORIDE CRYS ER 20 MEQ PO TBCR: 20.0000 meq | EXTENDED_RELEASE_TABLET | Freq: Once | ORAL | Status: DC

## 2016-09-08 MED ORDER — POTASSIUM CHLORIDE 20 MEQ PO PACK
20.0000 meq | PACK | Freq: Once | ORAL | Status: AC
Start: 2016-09-08 — End: 2016-09-08
  Administered 2016-09-08: 20 meq via ORAL

## 2016-09-08 MED ORDER — POTASSIUM CHLORIDE 20 MEQ PO PACK
PACK | ORAL | Status: AC
  Administered 2016-09-08: 20 meq via ORAL
  Filled 2016-09-08: qty 1

## 2016-09-08 NOTE — ED Triage Notes (Signed)
Pt ambulatory to triage.  Pt has chest pain since 1300 today.  No n/v/d.  Pt reports sob. Pt has asthma and has used inhaler today.   No diaphoresis.  Pt alert.

## 2016-09-08 NOTE — ED Notes (Signed)
Pt assisted w/ambulation to the bathroom.  Steady gait.  Ambulated well unassisted.

## 2016-09-08 NOTE — ED Notes (Signed)
Patient to stat desk asking about wait time. Patient updated on wait time.

## 2016-09-08 NOTE — ED Notes (Signed)
Patient states that she has a history of hypertension and has not taken her blood pressure medication in two days.

## 2016-09-08 NOTE — ED Notes (Signed)
Pt reports sharp chest pain to the center of her chest since 1pm today, radiates through to back; rates 8/10; pt says one episode of pain radiating into left back side of her neck;  Pt says she had chest pain last night that eased after using her inhaler and she was able to sleep well; adds she finished antibiotics for pneumonia several weeks ago; reports feeling better since then, until today; denies cough; denies fever; talking in complete coherent sentences;

## 2016-09-08 NOTE — Discharge Instructions (Signed)
Please seek medical attention for any high fevers, chest pain, shortness of breath, change in behavior, persistent vomiting, bloody stool or any other new or concerning symptoms.  

## 2016-09-08 NOTE — ED Provider Notes (Signed)
[facility] Emergency Department Provider Note  ____________________________________________   I have reviewed the triage vital signs and the nursing notes.   HISTORY  Chief Complaint Chest Pain   History limited by: Not Limited   HPI Gail Stevenson is a 61 y.o. female who presents to the emergency department today because of concerns for chest pain. Patient states it is been going on for a couple of days. Located in the center chest. It is intermittent. Patient states that it was a little worse today. She did bring her family member into the hospital. Family seems to think that her chest pain is related to stress. Patient denies any recent illness.   Past Medical History:  Diagnosis Date  . Asthma   . Cancer [facility]) 2003   right side total mastectomy  . Diabetes mellitus without complication ([place])   . Diastolic dysfunction   . Diverticulitis   . Hemorrhoids   . Hepatitis C   . Hypertension   . Trigeminal neuralgia     Patient Active Problem List   Diagnosis Date Noted  . Diverticulitis of colon with perforation   . Sepsis ([place])   . Type 2 diabetes mellitus without complication ([place])   . Essential hypertension   . Malignant neoplasm of female breast ([place])   . Morbid obesity ([place]) 04/02/2015  . Chest pain 04/02/2015  . Lactose intolerance 10/30/2014  . Fothergill's neuralgia 09/21/2014  . Left ventricular hypertrophy 02/16/2014  . Diastolic dysfunction 47/82/9562  . Reflux 01/19/2014  . Asthma, moderate persistent 11/27/2013  . Clinical depression 11/30/2004  . Chronic hepatitis C virus infection ([place]) 04/26/2004    Past Surgical History:  Procedure Laterality Date  . BREAST SURGERY Right 2003   total mastectomy  . MASTECTOMY Right 2003   total    Prior to Admission medications   Medication Sig Start Date End Date Taking? Authorizing Provider  amLODipine (NORVASC) 10 MG tablet Take 10 mg by mouth daily.    [provider]   aspirin EC 81 MG tablet Take 81 mg by mouth daily.    [provider]  beclomethasone (QVAR) 80 MCG/ACT inhaler Inhale into the lungs 4 (four) times daily as needed.    [provider]  BYDUREON 2 MG PEN Inject 2 mg as directed once a week. 04/04/15   [provider]  ciprofloxacin (CIPRO) 500 MG tablet Take 1 tablet (500 mg total) by mouth 2 (two) times daily. 06/12/15   Lavonia Drafts, MD  cloNIDine (CATAPRES) 0.1 MG tablet Take 1 tablet by mouth 2 (two) times daily. 03/13/15   [provider]  glipiZIDE (GLUCOTROL) 10 MG tablet Take 10 mg by mouth 2 (two) times daily before a meal.    [provider]  hydrochlorothiazide (HYDRODIURIL) 25 MG tablet Take 25 mg by mouth daily.    [provider]  HYDROcodone-acetaminophen (NORCO/VICODIN) 5-325 MG tablet Take 1 tablet by mouth every 4 (four) hours as needed for moderate pain. 06/12/15   Lavonia Drafts, MD  lovastatin (MEVACOR) 40 MG tablet Take 40 mg by mouth at bedtime.    [provider]  metFORMIN (GLUCOPHAGE) 500 MG tablet Take 2-3 tablets by mouth 2 (two) times daily. 03/13/15   [provider]  metroNIDAZOLE (FLAGYL) 500 MG tablet Take 1 tablet (500 mg total) by mouth 2 (two) times daily after a meal. 06/12/15   Lavonia Drafts, MD  Na Sulfate-K Sulfate-Mg Sulf (SUPREP BOWEL PREP) SOLN Take 1 kit by mouth as directed. 05/20/15  Lucilla Lame, MD  ondansetron (ZOFRAN ODT) 4 MG disintegrating tablet Take 1 tablet (4 mg total) by mouth every 8 (eight) hours as needed for nausea or vomiting. 01/03/16   Rudene Re, MD  potassium chloride (K-DUR) 10 MEQ tablet Take 1 tablet (10 mEq total) by mouth daily. 07/18/14   Johnn Hai, PA-C  PROAIR HFA [phone] Base) MCG/ACT inhaler Inhale 2 puffs into the lungs 4 (four) times daily as needed. 03/13/15   [provider]  ramipril (ALTACE) 10 MG capsule Take 10 mg by mouth 2 (two) times daily.    [provider]   ranitidine (ZANTAC) 150 MG tablet Take 150 mg by mouth 2 (two) times daily.    [provider]  sitaGLIPtin (JANUVIA) 100 MG tablet Take 100 mg by mouth daily.    [provider]  triamcinolone ointment (KENALOG) 0.5 % Apply 1 application topically 2 (two) times daily. 09/20/15   Menshew, Dannielle Karvonen, PA-C    Allergies Patanol [olopatadine]  Family History  Problem Relation Age of Onset  . Gastric cancer Father   . Breast cancer Maternal Aunt     Social History Social History  Substance Use Topics  . Smoking status: Former Smoker    Quit date: 05/17/1992  . Smokeless tobacco: Never Used  . Alcohol use No    Review of Systems Constitutional: Positive for chills Eyes: No visual changes. ENT: No sore throat. Cardiovascular: Positive for chest pain. Respiratory: Positive for shortness of breath. Gastrointestinal: No abdominal pain.  No nausea, no vomiting.  No diarrhea.   Genitourinary: Negative for dysuria. Musculoskeletal: Negative for back pain. Skin: Negative for rash. Neurological: Negative for headaches, focal weakness or numbness.  ____________________________________________   PHYSICAL EXAM:  VITAL SIGNS: ED Triage Vitals  Enc Vitals Group     BP 09/08/16 1751 (!) 162/80     Pulse Rate 09/08/16 1751 (!) 104     Resp 09/08/16 1751 20     Temp 09/08/16 1751 98.7 F (37.1 C)     Temp Source 09/08/16 1751 Oral     SpO2 09/08/16 1751 96 %     Weight 09/08/16 1746 178 lb (80.7 kg)     Height 09/08/16 1746 '5\' 2"'$  (1.575 m)     Head Circumference --      Peak Flow --      Pain Score 09/08/16 1746 9   Constitutional: Alert and oriented. Well appearing and in no distress. Eyes: Conjunctivae are normal.  ENT   Head: Normocephalic and atraumatic.   Nose: No congestion/rhinnorhea.   Mouth/Throat: Mucous membranes are moist.   Neck: No stridor. Hematological/Lymphatic/Immunilogical: No cervical lymphadenopathy. Cardiovascular: Normal  rate, regular rhythm.  No murmurs, rubs, or gallops.  Respiratory: Normal respiratory effort without tachypnea nor retractions. Breath sounds are clear and equal bilaterally. No wheezes/rales/rhonchi. Gastrointestinal: Soft and non tender. No rebound. No guarding.  Genitourinary: Deferred Musculoskeletal: Normal range of motion in all extremities. No lower extremity edema. Neurologic:  Normal speech and language. No gross focal neurologic deficits are appreciated.  Skin:  Skin is warm, dry and intact. No rash noted. Psychiatric: Mood and affect are normal. Speech and behavior are normal. Patient exhibits appropriate insight and judgment.  ____________________________________________    LABS (pertinent positives/negatives)  Labs Reviewed  BASIC METABOLIC PANEL - Abnormal; Notable for the following:       Result Value   Potassium 3.2 (*)    Glucose, Bld 211 (*)    Creatinine, Ser 1.06 (*)  GFR calc non Af Amer 55 (*)    All other components within normal limits  CBC - Abnormal; Notable for the following:    WBC 16.6 (*)    All other components within normal limits  TROPONIN I  TROPONIN I     ____________________________________________   EKG  I, Nance Pear, attending physician, personally viewed and interpreted this EKG  EKG Time: 1751 Rate: 99 Rhythm: normal sinus rhythm Axis: normal Intervals: qtc 444 QRS: LVH ST changes: no st elevation Impression: abnormal ekg   ____________________________________________    RADIOLOGY  CXR IMPRESSION: No active cardiopulmonary disease.  ____________________________________________   PROCEDURES  Procedures  ____________________________________________   INITIAL IMPRESSION / ASSESSMENT AND PLAN / ED COURSE  Pertinent labs & imaging results that were available during my care of the patient were reviewed by me and considered in my medical decision making (see chart for details).  Patient presented to the  emergency department today because of concerns for chest pains. 2 sets of troponin negative here. Her blood one is in the hospital so I do wonder if part of this is stress related. Will discharge with primary care follow-up.  ____________________________________________   FINAL CLINICAL IMPRESSION(S) / ED DIAGNOSES  Final diagnoses:  Nonspecific chest pain     Note: This dictation was prepared with Dragon dictation. Any transcriptional errors that result from this process are unintentional     Nance Pear, MD 09/08/16 [phone]

## 2017-02-12 HISTORY — PX: COLONOSCOPY: SHX174

## 2017-04-08 ENCOUNTER — Emergency Department: Payer: Medicaid Other

## 2017-04-08 ENCOUNTER — Emergency Department
Admission: EM | Admit: 2017-04-08 | Discharge: 2017-04-08 | Disposition: A | Discharge status: Home / Self Care | Payer: Medicaid Other | Attending: Student in an Organized Health Care Education/Training Program | Admitting: Student in an Organized Health Care Education/Training Program

## 2017-04-08 ENCOUNTER — Other Ambulatory Visit: Payer: Self-pay

## 2017-04-08 ENCOUNTER — Encounter: Payer: Self-pay | Admitting: Emergency Medicine

## 2017-04-08 DIAGNOSIS — I5030 Unspecified diastolic (congestive) heart failure: Secondary | ICD-10-CM | POA: Diagnosis not present

## 2017-04-08 DIAGNOSIS — Z7982 Long term (current) use of aspirin: Secondary | ICD-10-CM | POA: Insufficient documentation

## 2017-04-08 DIAGNOSIS — Z853 Personal history of malignant neoplasm of breast: Secondary | ICD-10-CM | POA: Insufficient documentation

## 2017-04-08 DIAGNOSIS — I110 Hypertensive heart disease with heart failure: Secondary | ICD-10-CM | POA: Insufficient documentation

## 2017-04-08 DIAGNOSIS — E119 Type 2 diabetes mellitus without complications: Secondary | ICD-10-CM | POA: Insufficient documentation

## 2017-04-08 DIAGNOSIS — Z79899 Other long term (current) drug therapy: Secondary | ICD-10-CM | POA: Diagnosis not present

## 2017-04-08 DIAGNOSIS — J40 Bronchitis, not specified as acute or chronic: Secondary | ICD-10-CM | POA: Insufficient documentation

## 2017-04-08 DIAGNOSIS — Z9011 Acquired absence of right breast and nipple: Secondary | ICD-10-CM | POA: Diagnosis not present

## 2017-04-08 DIAGNOSIS — Z7984 Long term (current) use of oral hypoglycemic drugs: Secondary | ICD-10-CM | POA: Insufficient documentation

## 2017-04-08 DIAGNOSIS — R079 Chest pain, unspecified: Secondary | ICD-10-CM | POA: Diagnosis present

## 2017-04-08 LAB — CBC
HEMATOCRIT: 44.7 % (ref 35.0–47.0)
Hemoglobin: 15.4 g/dL (ref 12.0–16.0)
MCH: 29.8 pg (ref 26.0–34.0)
MCHC: 34.5 g/dL (ref 32.0–36.0)
MCV: 86.5 fL (ref 80.0–100.0)
Platelets: 339 10*3/uL (ref 150–440)
RBC: 5.18 MIL/uL (ref 3.80–5.20)
RDW: 12.9 % (ref 11.5–14.5)
WBC: 18.9 10*3/uL — ABNORMAL HIGH (ref 3.6–11.0)

## 2017-04-08 LAB — COMPREHENSIVE METABOLIC PANEL
ALK PHOS: 130 U/L — AB (ref 38–126)
ALT: 17 U/L (ref 14–54)
ANION GAP: 13 (ref 5–15)
AST: 20 U/L (ref 15–41)
Albumin: 4.1 g/dL (ref 3.5–5.0)
BILIRUBIN TOTAL: 0.8 mg/dL (ref 0.3–1.2)
BUN: 11 mg/dL (ref 6–20)
CO2: 24 mmol/L (ref 22–32)
CREATININE: 0.93 mg/dL (ref 0.44–1.00)
Calcium: 9.2 mg/dL (ref 8.9–10.3)
Chloride: 91 mmol/L — ABNORMAL LOW (ref 101–111)
Glucose, Bld: 384 mg/dL — ABNORMAL HIGH (ref 65–99)
Potassium: 3.6 mmol/L (ref 3.5–5.1)
SODIUM: 128 mmol/L — AB (ref 135–145)
TOTAL PROTEIN: 8.7 g/dL — AB (ref 6.5–8.1)

## 2017-04-08 LAB — TROPONIN I: Troponin I: <0.03 ng/mL (ref <0.03)

## 2017-04-08 MED ORDER — AEROCHAMBER PLUS W/MASK MISC
1.0000 | Freq: Once | Status: AC
  Administered 2017-04-08: 1

## 2017-04-08 MED ORDER — PREDNISONE 20 MG PO TABS
60.0000 mg | ORAL_TABLET | Freq: Once | ORAL | Status: AC
Start: 2017-04-08 — End: 2017-04-08
  Administered 2017-04-08: 60 mg via ORAL
  Filled 2017-04-08: qty 3

## 2017-04-08 MED ORDER — AZITHROMYCIN 200 MG/5ML PO SUSR: 500.0000 mg | Freq: Every day | ORAL | 0 refills | Status: AC

## 2017-04-08 MED ORDER — PREDNISONE 20 MG PO TABS: 40.0000 mg | ORAL_TABLET | Freq: Every day | ORAL | 0 refills | Status: AC

## 2017-04-08 MED ORDER — IPRATROPIUM-ALBUTEROL 0.5-2.5 (3) MG/3ML IN SOLN
3.0000 mL | Freq: Once | RESPIRATORY_TRACT | Status: AC
  Administered 2017-04-08: 3 mL via RESPIRATORY_TRACT
  Filled 2017-04-08: qty 3

## 2017-04-08 MED ORDER — OPTICHAMBER DIAMOND MISC
Status: AC
  Administered 2017-04-08: 1
  Filled 2017-04-08: qty 1

## 2017-04-08 NOTE — ED Triage Notes (Signed)
States chest pain x 4 days. No injury. States is constant.

## 2017-04-08 NOTE — ED Provider Notes (Signed)
[facility] Emergency Department Provider Note    None    (approximate)  I have reviewed the triage vital signs and the nursing notes.   HISTORY  Chief Complaint Chest Pain    HPI Gail Stevenson is a 62 y.o. female history of bronchitis and asthma presents with chief complaint of chest pain and cough times 4 days.  States that initially started with nasal congestion and then went down into her lungs.  States that she has been coughing up.  Has felt warm but no measured fevers.  Her grandchild was recently sick with flulike illness.  States he came into the ER today because symptoms have not gotten any better despite trying to use inhalers at home.  No recent biotics.  No recent steroids.  Denies any abdominal pain.  Denies any pleuritic pain.  States it feels similar to previous episodes of bronchitis.  Past Medical History:  Diagnosis Date  . Asthma   . Cancer [facility]) 2003   right side total mastectomy  . Diabetes mellitus without complication ([place])   . Diastolic dysfunction   . Diverticulitis   . Hemorrhoids   . Hepatitis C   . Hypertension   . Trigeminal neuralgia    Family History  Problem Relation Age of Onset  . Gastric cancer Father   . Breast cancer Maternal Aunt    Past Surgical History:  Procedure Laterality Date  . BREAST SURGERY Right 2003   total mastectomy  . MASTECTOMY Right 2003   total   Patient Active Problem List   Diagnosis Date Noted  . Diverticulitis of colon with perforation   . Sepsis ([place])   . Type 2 diabetes mellitus without complication ([place])   . Essential hypertension   . Malignant neoplasm of female breast ([place])   . Morbid obesity ([place]) 04/02/2015  . Chest pain 04/02/2015  . Lactose intolerance 10/30/2014  . Fothergill's neuralgia 09/21/2014  . Left ventricular hypertrophy 02/16/2014  . Diastolic dysfunction 54/65/6812  . Reflux 01/19/2014  . Asthma, moderate persistent 11/27/2013  . Clinical depression  11/30/2004  . Chronic hepatitis C virus infection ([place]) 04/26/2004      Prior to Admission medications   Medication Sig Start Date End Date Taking? Authorizing Provider  amLODipine (NORVASC) 10 MG tablet Take 10 mg by mouth daily.    [provider]  aspirin EC 81 MG tablet Take 81 mg by mouth daily.    [provider]  beclomethasone (QVAR) 80 MCG/ACT inhaler Inhale into the lungs 4 (four) times daily as needed.    [provider]  BYDUREON 2 MG PEN Inject 2 mg as directed once a week. 04/04/15   [provider]  ciprofloxacin (CIPRO) 500 MG tablet Take 1 tablet (500 mg total) by mouth 2 (two) times daily. 06/12/15   Lavonia Drafts, MD  cloNIDine (CATAPRES) 0.1 MG tablet Take 1 tablet by mouth 2 (two) times daily. 03/13/15   [provider]  glipiZIDE (GLUCOTROL) 10 MG tablet Take 10 mg by mouth 2 (two) times daily before a meal.    [provider]  hydrochlorothiazide (HYDRODIURIL) 25 MG tablet Take 25 mg by mouth daily.    [provider]  HYDROcodone-acetaminophen (NORCO/VICODIN) 5-325 MG tablet Take 1 tablet by mouth every 4 (four) hours as needed for moderate pain. 06/12/15   Lavonia Drafts, MD  lovastatin (MEVACOR) 40 MG tablet Take 40 mg by mouth at bedtime.    [provider]  metFORMIN (GLUCOPHAGE) 500  MG tablet Take 2-3 tablets by mouth 2 (two) times daily. 03/13/15   [provider]  metroNIDAZOLE (FLAGYL) 500 MG tablet Take 1 tablet (500 mg total) by mouth 2 (two) times daily after a meal. 06/12/15   Lavonia Drafts, MD  Na Sulfate-K Sulfate-Mg Sulf (SUPREP BOWEL PREP) SOLN Take 1 kit by mouth as directed. 05/20/15   Lucilla Lame, MD  ondansetron (ZOFRAN ODT) 4 MG disintegrating tablet Take 1 tablet (4 mg total) by mouth every 8 (eight) hours as needed for nausea or vomiting. 01/03/16   Rudene Re, MD  potassium chloride (K-DUR) 10 MEQ tablet Take 1 tablet (10 mEq total) by mouth daily. 07/18/14    Johnn Hai, PA-C  PROAIR HFA 108 [phone] Base) MCG/ACT inhaler Inhale 2 puffs into the lungs 4 (four) times daily as needed. 03/13/15   [provider]  ramipril (ALTACE) 10 MG capsule Take 10 mg by mouth 2 (two) times daily.    [provider]  ranitidine (ZANTAC) 150 MG tablet Take 150 mg by mouth 2 (two) times daily.    [provider]  sitaGLIPtin (JANUVIA) 100 MG tablet Take 100 mg by mouth daily.    [provider]  triamcinolone ointment (KENALOG) 0.5 % Apply 1 application topically 2 (two) times daily. 09/20/15   Menshew, Dannielle Karvonen, PA-C    Allergies Patanol [olopatadine]    Social History Social History   Tobacco Use  . Smoking status: Former Smoker    Last attempt to quit: 05/17/1992    Years since quitting: 24.9  . Smokeless tobacco: Never Used  Substance Use Topics  . Alcohol use: No    Alcohol/week: 0.0 oz  . Drug use: No    Review of Systems Patient denies headaches, rhinorrhea, blurry vision, numbness, shortness of breath, chest pain, edema, cough, abdominal pain, nausea, vomiting, diarrhea, dysuria, fevers, rashes or hallucinations unless otherwise stated above in HPI. ____________________________________________   PHYSICAL EXAM:  VITAL SIGNS: Vitals:   04/08/17 1024  BP: (!) 173/71  Pulse: (!) 106  Resp: 16  Temp: 98.3 F (36.8 C)  SpO2: 97%    Constitutional: Alert and oriented in no acute distress. Eyes: Conjunctivae are normal.  Head: Atraumatic. Nose: No congestion/rhinnorhea. Mouth/Throat: Mucous membranes are moist.   Neck: No stridor. Painless ROM.  Cardiovascular: Normal rate, regular rhythm. Grossly normal heart sounds.  Good peripheral circulation. Respiratory: Normal respiratory effort.  No retractions. Lungs with diffuse wheeze and coarse bibasilar breathsounds Gastrointestinal: Soft and nontender. No distention. No abdominal bruits. No CVA tenderness. Genitourinary:  Musculoskeletal: No lower  extremity tenderness nor edema.  No joint effusions. Neurologic:  Normal speech and language. No gross focal neurologic deficits are appreciated. No facial droop Skin:  Skin is warm, dry and intact. No rash noted. Psychiatric: Mood and affect are normal. Speech and behavior are normal.  ____________________________________________   LABS (all labs ordered are listed, but only abnormal results are displayed)  Results for orders placed or performed during the hospital encounter of 04/08/17 (from the past 24 hour(s))  CBC     Status: Abnormal   Collection Time: 04/08/17 10:43 AM  Result Value Ref Range   WBC 18.9 (H) 3.6 - 11.0 K/uL   RBC 5.18 3.80 - 5.20 MIL/uL   Hemoglobin 15.4 12.0 - 16.0 g/dL   HCT 44.7 35.0 - 47.0 %   MCV 86.5 80.0 - 100.0 fL   MCH 29.8 26.0 - 34.0 pg   MCHC 34.5 32.0 - 36.0 g/dL  RDW 12.9 11.5 - 14.5 %   Platelets 339 150 - 440 K/uL  Troponin I     Status: None   Collection Time: 04/08/17 10:43 AM  Result Value Ref Range   Troponin I <0.03 <0.03 ng/mL  Comprehensive metabolic panel     Status: Abnormal   Collection Time: 04/08/17 10:43 AM  Result Value Ref Range   Sodium 128 (L) 135 - 145 mmol/L   Potassium 3.6 3.5 - 5.1 mmol/L   Chloride 91 (L) 101 - 111 mmol/L   CO2 24 22 - 32 mmol/L   Glucose, Bld 384 (H) 65 - 99 mg/dL   BUN 11 6 - 20 mg/dL   Creatinine, Ser 0.93 0.44 - 1.00 mg/dL   Calcium 9.2 8.9 - 10.3 mg/dL   Total Protein 8.7 (H) 6.5 - 8.1 g/dL   Albumin 4.1 3.5 - 5.0 g/dL   AST 20 15 - 41 U/L   ALT 17 14 - 54 U/L   Alkaline Phosphatase 130 (H) 38 - 126 U/L   Total Bilirubin 0.8 0.3 - 1.2 mg/dL   GFR calc non Af Amer >60 >60 mL/min   GFR calc Af Amer >60 >60 mL/min   Anion gap 13 5 - 15   ____________________________________________  EKG My review and personal interpretation at Time: 10:30   Indication: cough  Rate: 105  Rhythm: sinus Axis: normal Other: normal intervals, no stemi, occasional  pvc ____________________________________________  RADIOLOGY  I personally reviewed all radiographic images ordered to evaluate for the above acute complaints and reviewed radiology reports and findings.  These findings were personally discussed with the patient.  Please see medical record for radiology report.  ____________________________________________   PROCEDURES  Procedure(s) performed:  Procedures    Critical Care performed: no ____________________________________________   INITIAL IMPRESSION / ASSESSMENT AND PLAN / ED COURSE  Pertinent labs & imaging results that were available during my care of the patient were reviewed by me and considered in my medical decision making (see chart for details).  DDX: Asthma, copd, CHF, pna, ptx, malignancy, Pe, anemia   Sharnetta Gielow is a 62 y.o. who presents to the ED with symptoms as described above.  Not clinically consistent with ACS based on a negative troponin no ischemia on EKG and symptoms be more consistent with viral URI precipitating bronchitic symptoms given her history of asthma.  Patient not in any respiratory distress.  Chest x-ray shows no evidence of CHF.  Possible component of atypical pneumonia given her productive cough and symptoms of chills at home.  Will give nebulizer as well as steroids.  This is not clinically consistent with PE.  Clinical Course as of Apr 08 1218  Mon Apr 08, 2017  1218 Patient reassessed and had improvement in symptoms after nebulizer.  Wheezing more audible at this point suggesting component of bronchospasm.  At this point do believe patient stable and appropriate for trial of outpatient management steroids as well as inhaler.  Have discussed with the patient and available family all diagnostics and treatments performed thus far and all questions were answered to the best of my ability. The patient demonstrates understanding and agreement with plan.   [PR]    Clinical Course User Index [PR]  Merlyn Lot, MD     ____________________________________________   FINAL CLINICAL IMPRESSION(S) / ED DIAGNOSES  Final diagnoses:  Bronchitis      NEW MEDICATIONS STARTED DURING THIS VISIT:  New Prescriptions   No medications on file     Note:  This document was prepared using Dragon voice recognition software and may include unintentional dictation errors.    Merlyn Lot, MD 04/08/17 [phone]

## 2017-04-08 NOTE — ED Notes (Signed)
MD at bedside to update pt

## 2017-04-08 NOTE — Discharge Instructions (Signed)
Return to ER for any increase in pain, if the pain changes or becomes worse with physical activity, you have shortness of breath, nausea or vomiting associated with the chest pain. ° °

## 2018-03-03 IMAGING — CT CT ABD-PELV W/ CM
2 of 5 series · 16 of 46 positions shown, 18 images · IV contrast (APPLIED)
Comparison: 04/15/2015

CLINICAL DATA: Generalized abdominal pain for 2 days. Loose stools
and nausea. History of asthma, breast cancer, diabetes,
hypertension, diverticulitis.

EXAM:
CT ABDOMEN AND PELVIS WITH CONTRAST
TECHNIQUE: Multidetector CT imaging of the abdomen and pelvis was performed
using the standard protocol following bolus administration of
intravenous contrast.
CONTRAST:  100mL VQ5MET-0LL IOPAMIDOL (VQ5MET-0LL) INJECTION 61%

[Series 2: axial st · axial · 0.87mm/px · z∈[-913,-493]mm · 13 of 94 slices shown, 15 images]
[im 5/94  soft-tissue]
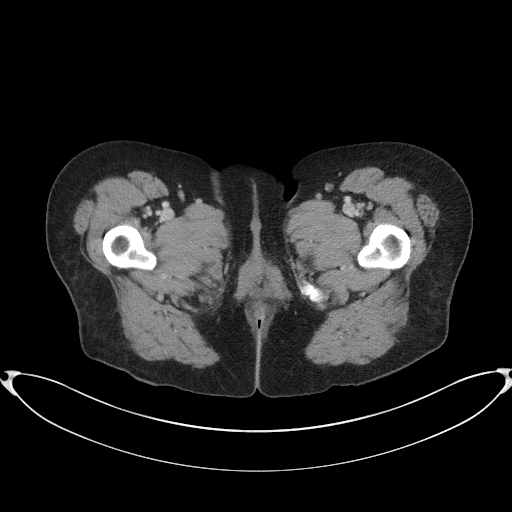
[im 5/94  bone]
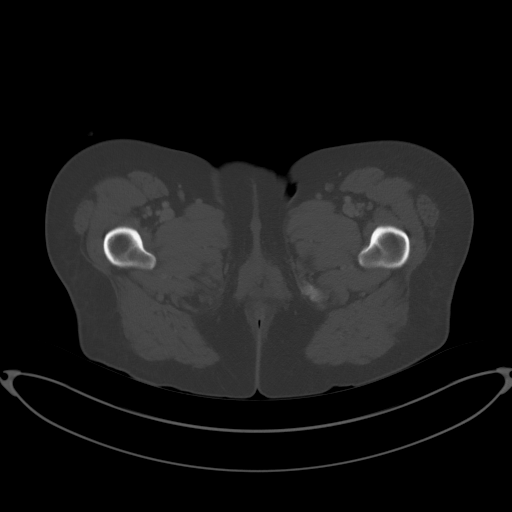
[im 15/94  soft-tissue]
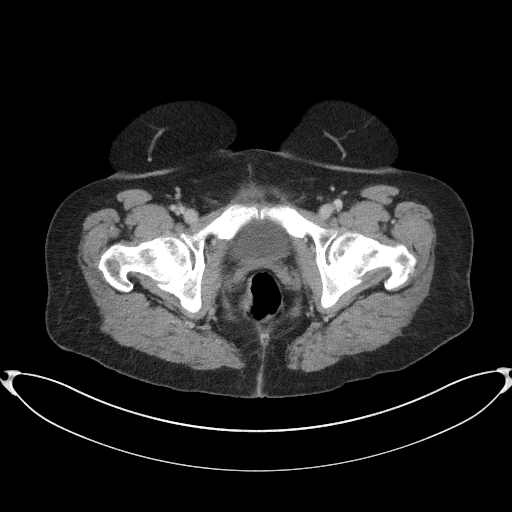
[im 20/94  soft-tissue]
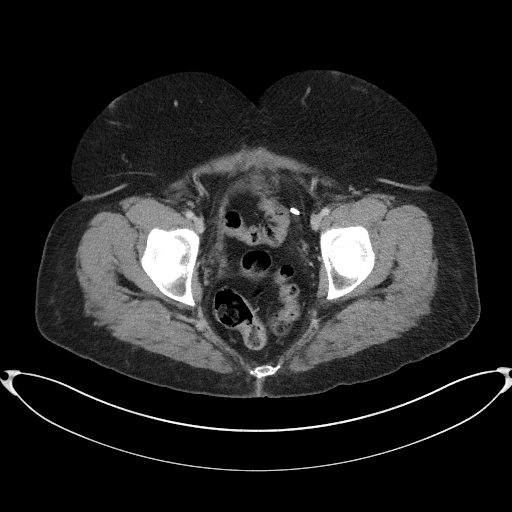
[im 25/94  soft-tissue]
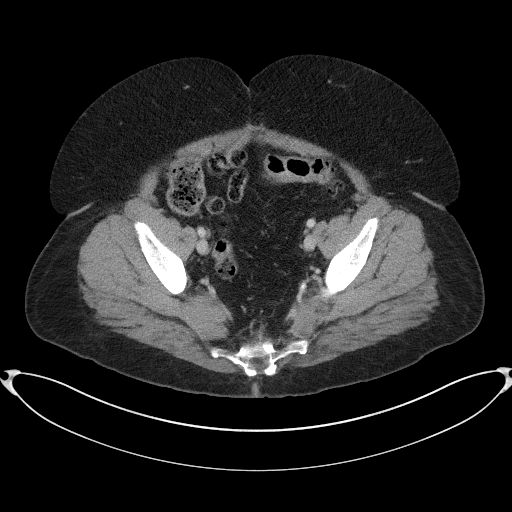
[im 35/94  soft-tissue]
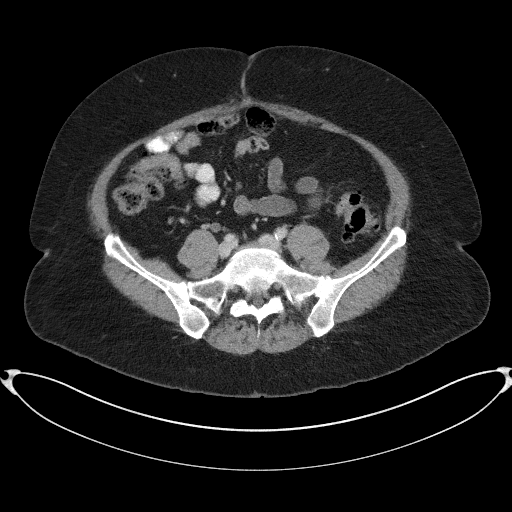
[im 40/94  soft-tissue]
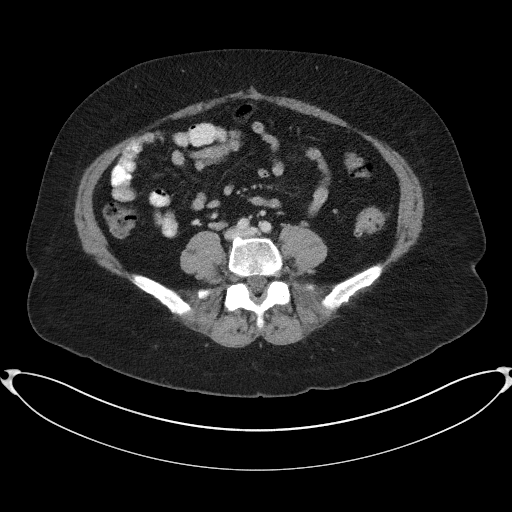
[im 49/94  soft-tissue]
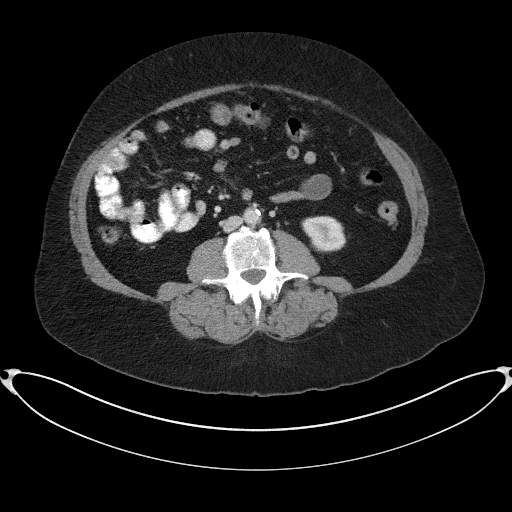
[im 54/94  soft-tissue]
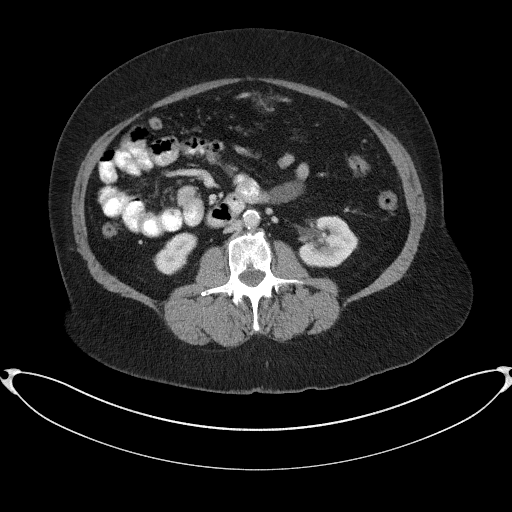
[im 59/94  soft-tissue]
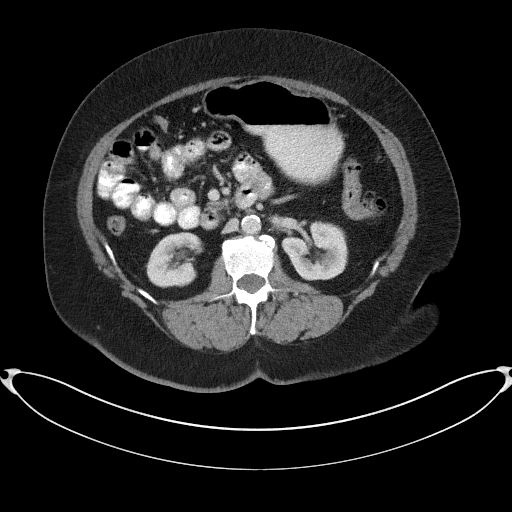
[im 59/94  bone]
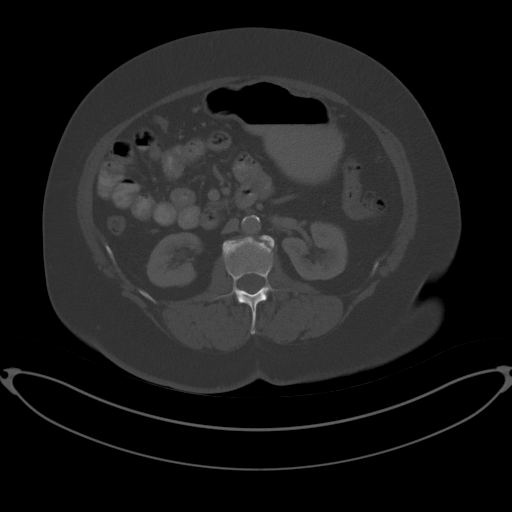
[im 69/94  soft-tissue]
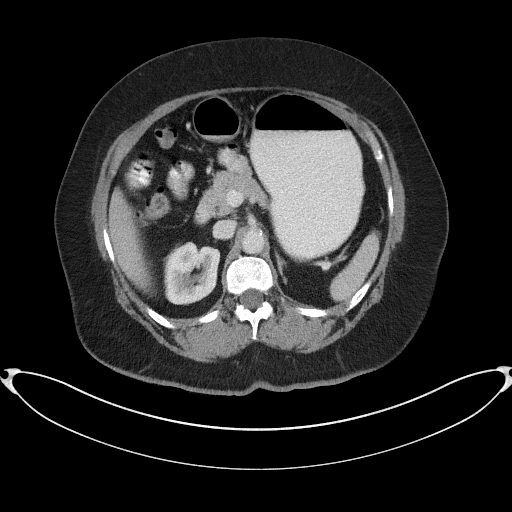
[im 74/94  soft-tissue]
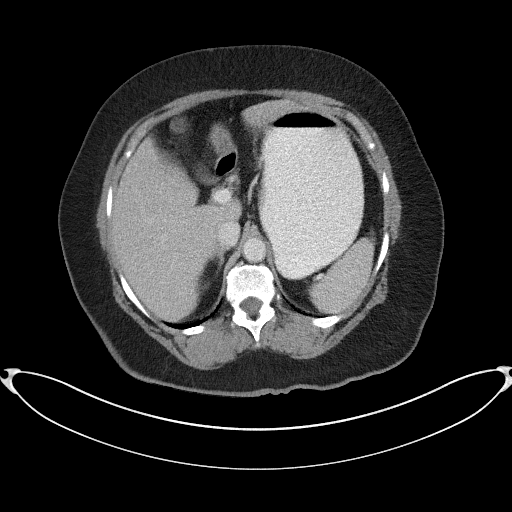
[im 79/94  soft-tissue]
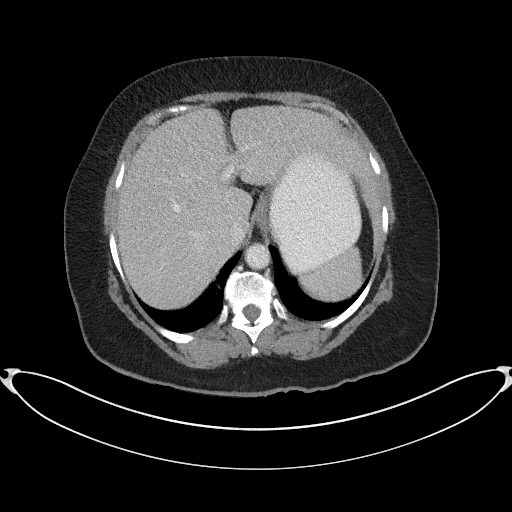
[im 89/94  soft-tissue]
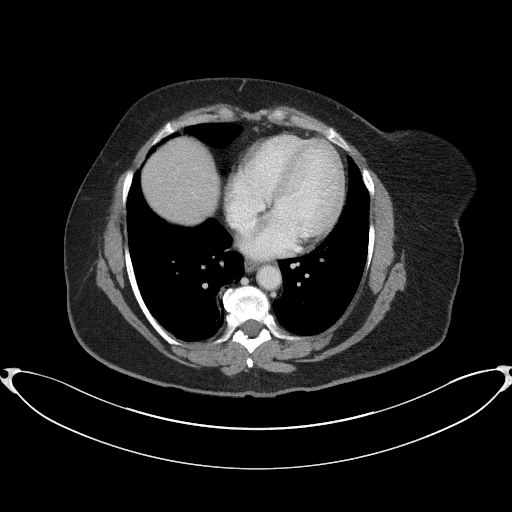

[Series 5: coronal st · coronal · 0.74mm/px · 3 of 103 slices shown]
[im 35/103  soft-tissue]
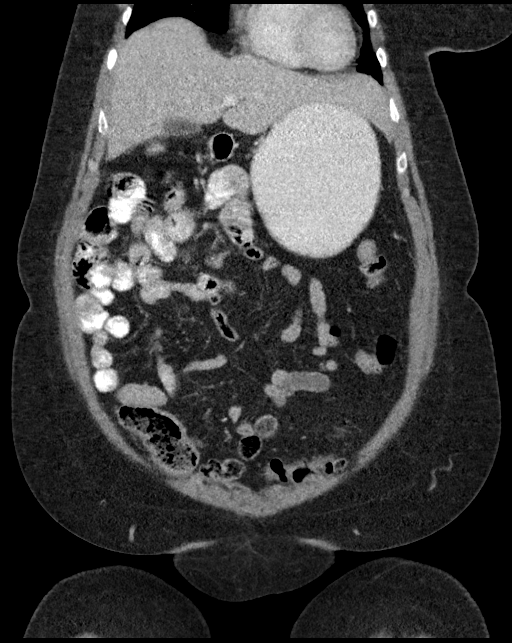
[im 46/103  soft-tissue]
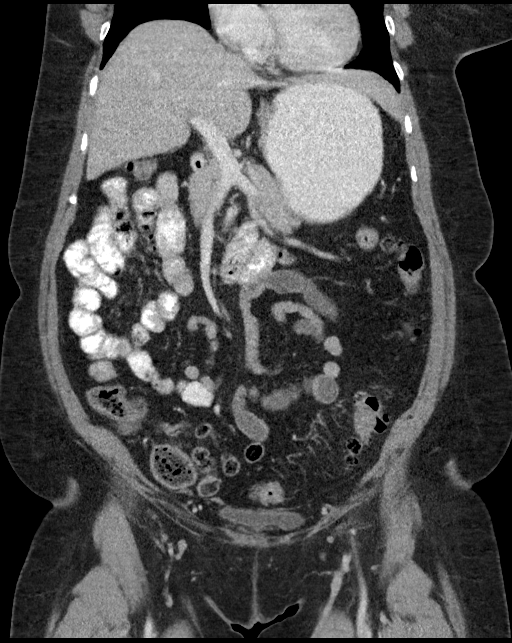
[im 57/103  soft-tissue]
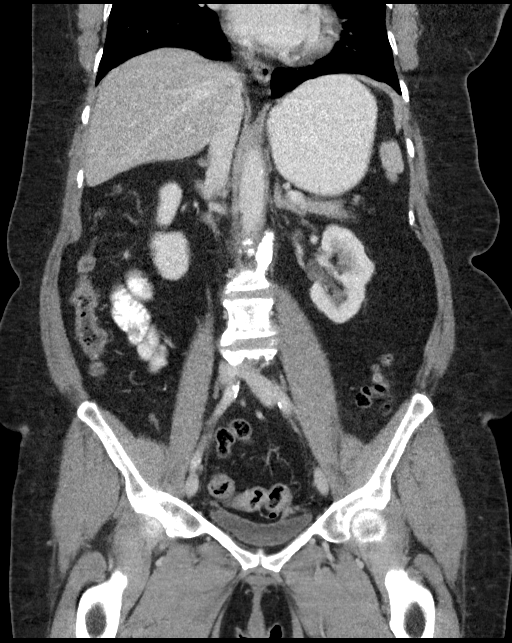

[16 of 46 positions shown; findings below may reference images not displayed]

FINDINGS: Lower chest: Scarring in the right lung base medially.

Hepatobiliary: No focal liver abnormality is seen. No gallstones,
gallbladder wall thickening, or biliary dilatation.

Pancreas: Unremarkable. No pancreatic ductal dilatation or
surrounding inflammatory changes.

Spleen: Normal in size without focal abnormality.

Adrenals/Urinary Tract: Adrenal glands are unremarkable. Kidneys are
normal, without renal calculi, focal lesion, or hydronephrosis.
Bladder is unremarkable.

Stomach/Bowel: Stomach is not abnormally distended and no wall
thickening is demonstrated. Small bowel are not abnormally
distended. No wall thickening. Scattered stool in the colon without
distention. Scattered colonic diverticula without inflammatory
change.

Vascular/Lymphatic: Aortic atherosclerosis. No enlarged abdominal or
pelvic lymph nodes.

Reproductive: Status post hysterectomy. No adnexal masses.

Other: No abdominal wall hernia or abnormality. No abdominopelvic
ascites.

Musculoskeletal: Degenerative changes in the spine. No destructive
bone lesions.
IMPRESSION: No acute process demonstrated in the abdomen or pelvis. No evidence
of bowel obstruction or inflammation. Colonic diverticulosis without
evidence of diverticulitis.

## 2018-11-09 ENCOUNTER — Emergency Department
Admission: EM | Admit: 2018-11-09 | Discharge: 2018-11-09 | Disposition: A | Discharge status: Home / Self Care | Payer: Medicaid Other | Attending: Emergency Medicine | Admitting: Emergency Medicine

## 2018-11-09 ENCOUNTER — Emergency Department: Payer: Medicaid Other

## 2018-11-09 ENCOUNTER — Other Ambulatory Visit: Payer: Self-pay

## 2018-11-09 DIAGNOSIS — Z7984 Long term (current) use of oral hypoglycemic drugs: Secondary | ICD-10-CM | POA: Diagnosis not present

## 2018-11-09 DIAGNOSIS — E119 Type 2 diabetes mellitus without complications: Secondary | ICD-10-CM | POA: Insufficient documentation

## 2018-11-09 DIAGNOSIS — Z853 Personal history of malignant neoplasm of breast: Secondary | ICD-10-CM | POA: Insufficient documentation

## 2018-11-09 DIAGNOSIS — I5030 Unspecified diastolic (congestive) heart failure: Secondary | ICD-10-CM | POA: Insufficient documentation

## 2018-11-09 DIAGNOSIS — R079 Chest pain, unspecified: Secondary | ICD-10-CM | POA: Diagnosis present

## 2018-11-09 DIAGNOSIS — F439 Reaction to severe stress, unspecified: Secondary | ICD-10-CM | POA: Diagnosis not present

## 2018-11-09 DIAGNOSIS — Z20828 Contact with and (suspected) exposure to other viral communicable diseases: Secondary | ICD-10-CM | POA: Insufficient documentation

## 2018-11-09 DIAGNOSIS — Z7982 Long term (current) use of aspirin: Secondary | ICD-10-CM | POA: Insufficient documentation

## 2018-11-09 DIAGNOSIS — Z79899 Other long term (current) drug therapy: Secondary | ICD-10-CM | POA: Diagnosis not present

## 2018-11-09 DIAGNOSIS — R0789 Other chest pain: Secondary | ICD-10-CM | POA: Diagnosis not present

## 2018-11-09 DIAGNOSIS — Z87891 Personal history of nicotine dependence: Secondary | ICD-10-CM | POA: Diagnosis not present

## 2018-11-09 DIAGNOSIS — I110 Hypertensive heart disease with heart failure: Secondary | ICD-10-CM | POA: Insufficient documentation

## 2018-11-09 LAB — HEPATIC FUNCTION PANEL
ALT: 22 U/L (ref 0–44)
AST: 23 U/L (ref 15–41)
Albumin: 3.7 g/dL (ref 3.5–5.0)
Alkaline Phosphatase: 115 U/L (ref 38–126)
Bilirubin, Direct: 0.1 mg/dL (ref 0.0–0.2)
Indirect Bilirubin: 0.5 mg/dL (ref 0.3–0.9)
Total Bilirubin: 0.6 mg/dL (ref 0.3–1.2)
Total Protein: 7.8 g/dL (ref 6.5–8.1)

## 2018-11-09 LAB — BASIC METABOLIC PANEL
Anion gap: 10 (ref 5–15)
BUN: 12 mg/dL (ref 8–23)
CO2: 27 mmol/L (ref 22–32)
Calcium: 9.3 mg/dL (ref 8.9–10.3)
Chloride: 103 mmol/L (ref 98–111)
Creatinine, Ser: 1.03 mg/dL — ABNORMAL HIGH (ref 0.44–1.00)
GFR calc Af Amer: >60 mL/min (ref >60)
GFR calc non Af Amer: 58 mL/min — ABNORMAL LOW (ref >60)
Glucose, Bld: 170 mg/dL — ABNORMAL HIGH (ref 70–99)
Potassium: 3.1 mmol/L — ABNORMAL LOW (ref 3.5–5.1)
Sodium: 140 mmol/L (ref 135–145)

## 2018-11-09 LAB — CBC
HCT: 44.5 % (ref 36.0–46.0)
Hemoglobin: 14.6 g/dL (ref 12.0–15.0)
MCH: 28.9 pg (ref 26.0–34.0)
MCHC: 32.8 g/dL (ref 30.0–36.0)
MCV: 87.9 fL (ref 80.0–100.0)
Platelets: 451 10*3/uL — ABNORMAL HIGH (ref 150–400)
RBC: 5.06 MIL/uL (ref 3.87–5.11)
RDW: 12.8 % (ref 11.5–15.5)
WBC: 11.9 10*3/uL — ABNORMAL HIGH (ref 4.0–10.5)
nRBC: 0 % (ref 0.0–0.2)

## 2018-11-09 LAB — GLUCOSE, CAPILLARY: Glucose-Capillary: 167 mg/dL — ABNORMAL HIGH (ref 70–99)

## 2018-11-09 LAB — TROPONIN I (HIGH SENSITIVITY)
Troponin I (High Sensitivity): 11 ng/L (ref <18)
Troponin I (High Sensitivity): 10 ng/L (ref <18)

## 2018-11-09 LAB — LIPASE, BLOOD: Lipase: 27 U/L (ref 11–51)

## 2018-11-09 MED ORDER — ONDANSETRON 4 MG PO TBDP
4.0000 mg | ORAL_TABLET | Freq: Once | ORAL | Status: AC
  Administered 2018-11-09: 19:00:00 4 mg via ORAL
  Filled 2018-11-09: qty 1

## 2018-11-09 MED ORDER — SODIUM CHLORIDE 0.9% FLUSH: 3.0000 mL | Freq: Once | INTRAVENOUS | Status: DC

## 2018-11-09 MED ORDER — HYDROXYZINE HCL 25 MG PO TABS: 25.0000 mg | ORAL_TABLET | Freq: Three times a day (TID) | ORAL | 0 refills | Status: DC | PRN

## 2018-11-09 NOTE — Discharge Instructions (Addendum)
Try the medication as prescribed for stress/anxiety and sleep.  I would start by taking it at night to see if it helps his sleep.  Some your symptoms could be related to stress.  Otherwise, discussed with your doctor in follow-up within the next 1 week to 2 weeks as we discussed.  Return if any symptoms worsen.

## 2018-11-09 NOTE — ED Triage Notes (Signed)
Pt presents via POV c/o nausea with chest pain and some diarrhea. Pt reports may be hyperglycemic. CBG 167.

## 2018-11-09 NOTE — ED Provider Notes (Signed)
[facility] Emergency Department Provider Note  ____________________________________________   First MD Initiated Contact with Patient 11/09/18 1820     (approximate)  I have reviewed the triage vital signs and the nursing notes.   HISTORY  Chief Complaint Chest Pain    HPI Gail Stevenson is a 63 y.o. female  With PMHx DM, HFpEF, hep C, HTN, here with atypical chest pain, left breast pain. Pt reports that her sx started today as diffuse body aches, chills, mild chest pain, and cough. She states she has also had intermittent positional left breast pain for which she is scheduled to see her oncologist. She denies any vomiting but has had nausea, loose stools. No fevers but has had chills.  No known sick contacts.  She states that she became concerned that she cannot get a PCP appointment for the next 1 to 2 weeks, so she presents for further evaluation.  No known coronavirus exposures.  No known coronary disease.  No history of PE.  No leg swelling.  She describes her chest pressure as an aching, throbbing pain, worse with coughing.  She has some mild wheezing with her cough.  She also reports history of asthma.  No shortness of breath.  No diaphoresis.        Past Medical History:  Diagnosis Date  . Asthma   . Cancer [facility]) 2003   right side total mastectomy  . Diabetes mellitus without complication ([place])   . Diastolic dysfunction   . Diverticulitis   . Hemorrhoids   . Hepatitis C   . Hypertension   . Trigeminal neuralgia     Patient Active Problem List   Diagnosis Date Noted  . Diverticulitis of colon with perforation   . Sepsis ([place])   . Type 2 diabetes mellitus without complication ([place])   . Essential hypertension   . Malignant neoplasm of female breast ([place])   . Morbid obesity ([employer]) 04/02/2015  . Chest pain 04/02/2015  . Lactose intolerance 10/30/2014  . Fothergill's neuralgia 09/21/2014  . Left ventricular hypertrophy 02/16/2014  .  Diastolic dysfunction 99991111  . Reflux 01/19/2014  . Asthma, moderate persistent 11/27/2013  . Clinical depression 11/30/2004  . Chronic hepatitis C virus infection ([place]) 04/26/2004    Past Surgical History:  Procedure Laterality Date  . BREAST SURGERY Right 2003   total mastectomy  . MASTECTOMY Right 2003   total    Prior to Admission medications   Medication Sig Start Date End Date Taking? Authorizing Provider  ADVAIR DISKUS 500-50 MCG/DOSE AEPB INHALE 1 PUFF TWO (2) TIMES A DAY. 10/06/18  Yes [provider]  amLODipine (NORVASC) 10 MG tablet Take 10 mg by mouth daily.   Yes [provider]  aspirin EC 81 MG tablet Take 81 mg by mouth daily.   Yes [provider]  beclomethasone (QVAR) 80 MCG/ACT inhaler Inhale into the lungs 4 (four) times daily as needed.   Yes [provider]  BYDUREON 2 MG PEN Inject 2 mg as directed once a week. 04/04/15  Yes [provider]  cloNIDine (CATAPRES) 0.1 MG tablet Take 1 tablet by mouth 2 (two) times daily. 03/13/15  Yes [provider]  enalapril (VASOTEC) 10 MG tablet TAKE 2 TABLETS BY MOUTH TWICE A DAY. *REPLACES ALTACE* 10/06/18  Yes [provider]  glipiZIDE (GLUCOTROL) 10 MG tablet Take 10 mg by mouth 2 (two) times daily before a meal.   Yes [provider]  hydrochlorothiazide (HYDRODIURIL) 25 MG tablet  Take 25 mg by mouth daily.   Yes [provider]  HYDROcodone-acetaminophen (NORCO/VICODIN) 5-325 MG tablet Take 1 tablet by mouth every 4 (four) hours as needed for moderate pain. 06/12/15  Yes Lavonia Drafts, MD  LANTUS SOLOSTAR 100 UNIT/ML Solostar Pen INJECT 56 UNITS UNDER THE SKIN NIGHTLY. 10/07/18  Yes [provider]  lovastatin (MEVACOR) 40 MG tablet Take 40 mg by mouth at bedtime.   Yes [provider]  metFORMIN (GLUCOPHAGE) 500 MG tablet Take 2-3 tablets by mouth 2 (two) times daily. 03/13/15  Yes [provider]  mirtazapine  (REMERON SOL-TAB) 15 MG disintegrating tablet TAKE 1 TABLET BY MOUTH EVERY DAY AT NIGHT 09/30/18  Yes [provider]  potassium chloride (K-DUR) 10 MEQ tablet Take 1 tablet (10 mEq total) by mouth daily. 07/18/14  Yes Johnn Hai, PA-C  PROAIR HFA 108 (90 Base) MCG/ACT inhaler Inhale 2 puffs into the lungs 4 (four) times daily as needed. 03/13/15  Yes [provider]  sitaGLIPtin (JANUVIA) 100 MG tablet Take 100 mg by mouth daily.   Yes [provider]  hydrOXYzine (ATARAX/VISTARIL) 25 MG tablet Take 1 tablet (25 mg total) by mouth every 8 (eight) hours as needed for anxiety (or sleep). 11/09/18   Duffy Bruce, MD    Allergies Patanol [olopatadine] and Paroxetine hcl  Family History  Problem Relation Age of Onset  . Gastric cancer Father   . Breast cancer Maternal Aunt     Social History Social History   Tobacco Use  . Smoking status: Former Smoker    Quit date: 05/17/1992    Years since quitting: 26.4  . Smokeless tobacco: Never Used  Substance Use Topics  . Alcohol use: No    Alcohol/week: 0.0 standard drinks  . Drug use: No    Review of Systems  Review of Systems  Constitutional: Positive for fatigue. Negative for fever.  HENT: Negative for congestion and sore throat.   Eyes: Negative for visual disturbance.  Respiratory: Positive for chest tightness. Negative for cough and shortness of breath.   Cardiovascular: Positive for chest pain.  Gastrointestinal: Positive for diarrhea and nausea. Negative for abdominal pain and vomiting.  Genitourinary: Negative for flank pain.  Musculoskeletal: Negative for back pain and neck pain.  Skin: Negative for rash and wound.  Neurological: Positive for weakness.  All other systems reviewed and are negative.    ____________________________________________  PHYSICAL EXAM:      VITAL SIGNS: ED Triage Vitals  Enc Vitals Group     BP 11/09/18 1629 (!) 184/100     Pulse Rate 11/09/18 1629 100     Resp  11/09/18 1629 16     Temp 11/09/18 1629 98.9 F (37.2 C)     Temp Source 11/09/18 1629 Oral     SpO2 11/09/18 1629 97 %     Weight --      Height --      Head Circumference --      Peak Flow --      Pain Score 11/09/18 1630 6     Pain Loc --      Pain Edu? --      Excl. in [place]? --      Physical Exam Vitals signs and nursing note reviewed.  Constitutional:      General: She is not in acute distress.    Appearance: She is well-developed.  HENT:     Head: Normocephalic and atraumatic.  Eyes:     Conjunctiva/sclera: Conjunctivae  normal.  Neck:     Musculoskeletal: Neck supple.  Cardiovascular:     Rate and Rhythm: Normal rate and regular rhythm.     Heart sounds: Normal heart sounds. No murmur. No friction rub.  Pulmonary:     Effort: Pulmonary effort is normal. No respiratory distress.     Breath sounds: Normal breath sounds. No wheezing or rales.  Abdominal:     General: There is no distension.     Palpations: Abdomen is soft.     Tenderness: There is no abdominal tenderness.  Skin:    General: Skin is warm.     Capillary Refill: Capillary refill takes less than 2 seconds.  Neurological:     Mental Status: She is alert and oriented to person, place, and time.     Motor: No abnormal muscle tone.       ____________________________________________   LABS (all labs ordered are listed, but only abnormal results are displayed)  Labs Reviewed  GLUCOSE, CAPILLARY - Abnormal; Notable for the following components:      Result Value   Glucose-Capillary 167 (*)    All other components within normal limits  BASIC METABOLIC PANEL - Abnormal; Notable for the following components:   Potassium 3.1 (*)    Glucose, Bld 170 (*)    Creatinine, Ser 1.03 (*)    GFR calc non Af Amer 58 (*)    All other components within normal limits  CBC - Abnormal; Notable for the following components:   WBC 11.9 (*)    Platelets 451 (*)    All other components within normal limits  NOVEL  CORONAVIRUS, NAA (HOSP ORDER, SEND-OUT TO REF LAB; TAT 18-24 HRS)  HEPATIC FUNCTION PANEL  LIPASE, BLOOD  TROPONIN I (HIGH SENSITIVITY)  TROPONIN I (HIGH SENSITIVITY)    ____________________________________________  EKG: Normal sinus rhythm, ventricular rate 97.  PR 146, QRS 82, QTc 480.  Nonspecific T wave abnormality noted, borderline prolonged QTC, no significant change from previous.  PVCs from previous have resolved. ________________________________________  RADIOLOGY All imaging, including plain films, CT scans, and ultrasounds, independently reviewed by me, and interpretations confirmed via formal radiology reads.  ED MD interpretation:   Chest x-ray: Clear  Official radiology report(s): Dg Chest 2 View  Result Date: 11/09/2018 CLINICAL DATA:  Nausea, chest pain and diarrhea. EXAM: CHEST - 2 VIEW COMPARISON:  None. FINDINGS: The heart size and mediastinal contours are within normal limits. Both lungs are clear. The visualized skeletal structures are unremarkable. IMPRESSION: No active cardiopulmonary disease. Electronically Signed   By: Kerby Moors M.D.   On: 11/09/2018 17:50    ____________________________________________  PROCEDURES   Procedure(s) performed (including Critical Care):  Procedures  ____________________________________________  INITIAL IMPRESSION / MDM / [place] / ED COURSE  As part of my medical decision making, I reviewed the following data within the Ruth was evaluated in Emergency Department on 11/09/2018 for the symptoms described in the history of present illness. She was evaluated in the context of the global COVID-19 pandemic, which necessitated consideration that the patient might be at risk for infection with the SARS-CoV-2 virus that causes COVID-19. Institutional protocols and algorithms that pertain to the evaluation of patients at risk for COVID-19 are in a state of rapid change  based on information released by regulatory bodies including the CDC and federal and state organizations. These policies and algorithms were followed during the patient's care in the ED.  Some ED evaluations and interventions may be delayed as a result of limited staffing during the pandemic.*   Clinical Course as of Nov 08 2012  Sun Nov 09, 7463  4279 63 year old female here with highly atypical chest pain.  Of note, this occurs in the setting of significant recent stressors at home and she has had similar symptoms to distress in the past.  EKG is nonischemic and troponins are negative x2, despite symptoms greater than 12 hours, and I have a low concern for ACS.   [CI]  2012 Otherwise, chest x-ray is clear.  Lab work is very reassuring.  Normal LFTs, lipase, I do not suspect referred pain from pancreatitis, cholecystitis, or intra-abdominal pathology.  She has no hypoxia, shortness of breath, increased work of breathing, or symptoms to suggest PE.  Patient will be treated for Atarax as needed for sleep, given her history of similar reactions to stress in the past, discharged with close outpatient follow-up.  She is amenable to this plan.   [CI]    Clinical Course User Index [CI] Duffy Bruce, MD    Medical Decision Making: As above.  ____________________________________________  FINAL CLINICAL IMPRESSION(S) / ED DIAGNOSES  Final diagnoses:  Atypical chest pain  Stress     MEDICATIONS GIVEN DURING THIS VISIT:  Medications  sodium chloride flush (NS) 0.9 % injection 3 mL (has no administration in time range)  ondansetron (ZOFRAN-ODT) disintegrating tablet 4 mg (4 mg Oral Given 11/09/18 1851)     ED Discharge Orders         Ordered    hydrOXYzine (ATARAX/VISTARIL) 25 MG tablet  Every 8 hours PRN     11/09/18 2010           Note:  This document was prepared using Dragon voice recognition software and may include unintentional dictation errors.   Duffy Bruce, MD  11/09/18 2014

## 2018-11-09 NOTE — ED Notes (Signed)
Pt resting in bed, in no distress, hooked back up to monitor.

## 2018-11-11 LAB — NOVEL CORONAVIRUS, NAA (HOSP ORDER, SEND-OUT TO REF LAB; TAT 18-24 HRS): SARS-CoV-2, NAA: NOT DETECTED

## 2019-01-25 ENCOUNTER — Inpatient Hospital Stay (HOSPITAL_COMMUNITY)
Admission: EM | Admit: 2019-01-25 | Discharge: 2019-02-02 | DRG: 958 | Disposition: A | Discharge status: Home Health | Payer: Medicaid Other | Attending: General Surgery | Admitting: General Surgery

## 2019-01-25 ENCOUNTER — Encounter (HOSPITAL_COMMUNITY): Payer: Self-pay

## 2019-01-25 ENCOUNTER — Other Ambulatory Visit: Payer: Self-pay

## 2019-01-25 ENCOUNTER — Emergency Department (HOSPITAL_COMMUNITY): Payer: Medicaid Other

## 2019-01-25 ENCOUNTER — Encounter (HOSPITAL_COMMUNITY): Admission: EM | Disposition: A | Discharge status: Home Health | Payer: Self-pay | Source: Home / Self Care

## 2019-01-25 DIAGNOSIS — S32591A Other specified fracture of right pubis, initial encounter for closed fracture: Secondary | ICD-10-CM

## 2019-01-25 DIAGNOSIS — D62 Acute posthemorrhagic anemia: Secondary | ICD-10-CM | POA: Diagnosis not present

## 2019-01-25 DIAGNOSIS — R42 Dizziness and giddiness: Secondary | ICD-10-CM | POA: Diagnosis present

## 2019-01-25 DIAGNOSIS — Z853 Personal history of malignant neoplasm of breast: Secondary | ICD-10-CM

## 2019-01-25 DIAGNOSIS — S80212A Abrasion, left knee, initial encounter: Secondary | ICD-10-CM | POA: Diagnosis present

## 2019-01-25 DIAGNOSIS — Z20828 Contact with and (suspected) exposure to other viral communicable diseases: Secondary | ICD-10-CM | POA: Diagnosis present

## 2019-01-25 DIAGNOSIS — S36438A Laceration of other part of small intestine, initial encounter: Principal | ICD-10-CM | POA: Diagnosis present

## 2019-01-25 DIAGNOSIS — S80211A Abrasion, right knee, initial encounter: Secondary | ICD-10-CM | POA: Diagnosis present

## 2019-01-25 DIAGNOSIS — I10 Essential (primary) hypertension: Secondary | ICD-10-CM | POA: Diagnosis present

## 2019-01-25 DIAGNOSIS — S32059A Unspecified fracture of fifth lumbar vertebra, initial encounter for closed fracture: Secondary | ICD-10-CM | POA: Diagnosis present

## 2019-01-25 DIAGNOSIS — R52 Pain, unspecified: Secondary | ICD-10-CM

## 2019-01-25 DIAGNOSIS — Z7982 Long term (current) use of aspirin: Secondary | ICD-10-CM

## 2019-01-25 DIAGNOSIS — M25571 Pain in right ankle and joints of right foot: Secondary | ICD-10-CM | POA: Diagnosis present

## 2019-01-25 DIAGNOSIS — Z9011 Acquired absence of right breast and nipple: Secondary | ICD-10-CM

## 2019-01-25 DIAGNOSIS — R609 Edema, unspecified: Secondary | ICD-10-CM

## 2019-01-25 DIAGNOSIS — E785 Hyperlipidemia, unspecified: Secondary | ICD-10-CM | POA: Diagnosis present

## 2019-01-25 DIAGNOSIS — S3681XA Injury of peritoneum, initial encounter: Secondary | ICD-10-CM | POA: Diagnosis present

## 2019-01-25 DIAGNOSIS — S02113A Unspecified occipital condyle fracture, initial encounter for closed fracture: Secondary | ICD-10-CM | POA: Diagnosis present

## 2019-01-25 DIAGNOSIS — Z79899 Other long term (current) drug therapy: Secondary | ICD-10-CM

## 2019-01-25 DIAGNOSIS — Z888 Allergy status to other drugs, medicaments and biological substances status: Secondary | ICD-10-CM

## 2019-01-25 DIAGNOSIS — Z87891 Personal history of nicotine dependence: Secondary | ICD-10-CM

## 2019-01-25 DIAGNOSIS — T1490XA Injury, unspecified, initial encounter: Secondary | ICD-10-CM

## 2019-01-25 DIAGNOSIS — S32810A Multiple fractures of pelvis with stable disruption of pelvic ring, initial encounter for closed fracture: Secondary | ICD-10-CM | POA: Diagnosis present

## 2019-01-25 DIAGNOSIS — Y9301 Activity, walking, marching and hiking: Secondary | ICD-10-CM | POA: Diagnosis present

## 2019-01-25 DIAGNOSIS — E119 Type 2 diabetes mellitus without complications: Secondary | ICD-10-CM | POA: Diagnosis present

## 2019-01-25 DIAGNOSIS — Z23 Encounter for immunization: Secondary | ICD-10-CM

## 2019-01-25 DIAGNOSIS — S080XXA Avulsion of scalp, initial encounter: Secondary | ICD-10-CM | POA: Diagnosis present

## 2019-01-25 DIAGNOSIS — J9690 Respiratory failure, unspecified, unspecified whether with hypoxia or hypercapnia: Secondary | ICD-10-CM

## 2019-01-25 DIAGNOSIS — S36892A Contusion of other intra-abdominal organs, initial encounter: Secondary | ICD-10-CM

## 2019-01-25 DIAGNOSIS — K567 Ileus, unspecified: Secondary | ICD-10-CM | POA: Diagnosis not present

## 2019-01-25 DIAGNOSIS — S0081XA Abrasion of other part of head, initial encounter: Secondary | ICD-10-CM | POA: Diagnosis present

## 2019-01-25 DIAGNOSIS — M25572 Pain in left ankle and joints of left foot: Secondary | ICD-10-CM | POA: Diagnosis present

## 2019-01-25 DIAGNOSIS — K660 Peritoneal adhesions (postprocedural) (postinfection): Secondary | ICD-10-CM | POA: Diagnosis present

## 2019-01-25 DIAGNOSIS — N179 Acute kidney failure, unspecified: Secondary | ICD-10-CM | POA: Diagnosis not present

## 2019-01-25 DIAGNOSIS — Z9049 Acquired absence of other specified parts of digestive tract: Secondary | ICD-10-CM

## 2019-01-25 DIAGNOSIS — Z794 Long term (current) use of insulin: Secondary | ICD-10-CM

## 2019-01-25 DIAGNOSIS — J45909 Unspecified asthma, uncomplicated: Secondary | ICD-10-CM | POA: Diagnosis present

## 2019-01-25 HISTORY — PX: LAPAROTOMY: SHX154

## 2019-01-25 LAB — I-STAT CHEM 8, ED
BUN: 17 mg/dL (ref 8–23)
Calcium, Ion: 1.09 mmol/L — ABNORMAL LOW (ref 1.15–1.40)
Chloride: 103 mmol/L (ref 98–111)
Creatinine, Ser: 1.3 mg/dL — ABNORMAL HIGH (ref 0.44–1.00)
Glucose, Bld: 203 mg/dL — ABNORMAL HIGH (ref 70–99)
HCT: 43 % (ref 36.0–46.0)
Hemoglobin: 14.6 g/dL (ref 12.0–15.0)
Potassium: 3.2 mmol/L — ABNORMAL LOW (ref 3.5–5.1)
Sodium: 140 mmol/L (ref 135–145)
TCO2: 26 mmol/L (ref 22–32)

## 2019-01-25 LAB — CBC
HCT: 42.3 % (ref 36.0–46.0)
Hemoglobin: 13.9 g/dL (ref 12.0–15.0)
MCH: 29.4 pg (ref 26.0–34.0)
MCHC: 32.9 g/dL (ref 30.0–36.0)
MCV: 89.6 fL (ref 80.0–100.0)
Platelets: 445 10*3/uL — ABNORMAL HIGH (ref 150–400)
RBC: 4.72 MIL/uL (ref 3.87–5.11)
RDW: 12.9 % (ref 11.5–15.5)
WBC: 19.8 10*3/uL — ABNORMAL HIGH (ref 4.0–10.5)
nRBC: 0 % (ref 0.0–0.2)

## 2019-01-25 LAB — PROTIME-INR
INR: 1.2 (ref 0.8–1.2)
Prothrombin Time: 15.3 seconds — ABNORMAL HIGH (ref 11.4–15.2)

## 2019-01-25 LAB — COMPREHENSIVE METABOLIC PANEL
ALT: 32 U/L (ref 0–44)
AST: 38 U/L (ref 15–41)
Albumin: 3.5 g/dL (ref 3.5–5.0)
Alkaline Phosphatase: 109 U/L (ref 38–126)
Anion gap: 12 (ref 5–15)
BUN: 16 mg/dL (ref 8–23)
CO2: 25 mmol/L (ref 22–32)
Calcium: 9.1 mg/dL (ref 8.9–10.3)
Chloride: 102 mmol/L (ref 98–111)
Creatinine, Ser: 1.36 mg/dL — ABNORMAL HIGH (ref 0.44–1.00)
GFR calc Af Amer: 48 mL/min — ABNORMAL LOW (ref >60)
GFR calc non Af Amer: 41 mL/min — ABNORMAL LOW (ref >60)
Glucose, Bld: 202 mg/dL — ABNORMAL HIGH (ref 70–99)
Potassium: 3.3 mmol/L — ABNORMAL LOW (ref 3.5–5.1)
Sodium: 139 mmol/L (ref 135–145)
Total Bilirubin: 0.5 mg/dL (ref 0.3–1.2)
Total Protein: 7.2 g/dL (ref 6.5–8.1)

## 2019-01-25 LAB — LIPASE, BLOOD: Lipase: 23 U/L (ref 11–51)

## 2019-01-25 LAB — SAMPLE TO BLOOD BANK

## 2019-01-25 LAB — POC SARS CORONAVIRUS 2 AG -  ED: SARS Coronavirus 2 Ag: NEGATIVE

## 2019-01-25 LAB — ETHANOL: Alcohol, Ethyl (B): <10 mg/dL (ref <10)

## 2019-01-25 SURGERY — LAPAROTOMY, EXPLORATORY
Anesthesia: General | Site: Abdomen

## 2019-01-25 MED ORDER — DEXAMETHASONE SODIUM PHOSPHATE 10 MG/ML IJ SOLN
INTRAMUSCULAR | Status: AC
  Filled 2019-01-25: qty 1

## 2019-01-25 MED ORDER — FENTANYL CITRATE (PF) 250 MCG/5ML IJ SOLN
INTRAMUSCULAR | Status: AC
  Filled 2019-01-25: qty 5

## 2019-01-25 MED ORDER — PROPOFOL 10 MG/ML IV BOLUS
INTRAVENOUS | Status: AC
  Filled 2019-01-25: qty 20

## 2019-01-25 MED ORDER — TETANUS-DIPHTH-ACELL PERTUSSIS 5-2.5-18.5 LF-MCG/0.5 IM SUSP
0.5000 mL | Freq: Once | INTRAMUSCULAR | Status: AC
  Administered 2019-01-25: 22:00:00 0.5 mL via INTRAMUSCULAR
  Filled 2019-01-25: qty 0.5

## 2019-01-25 MED ORDER — IOHEXOL 300 MG/ML  SOLN
100.0000 mL | Freq: Once | INTRAMUSCULAR | Status: AC | PRN
  Administered 2019-01-25: 22:00:00 100 mL via INTRAVENOUS

## 2019-01-25 MED ORDER — MIDAZOLAM HCL 2 MG/2ML IJ SOLN
INTRAMUSCULAR | Status: AC
  Filled 2019-01-25: qty 2

## 2019-01-25 MED ORDER — FENTANYL CITRATE (PF) 100 MCG/2ML IJ SOLN
50.0000 ug | Freq: Once | INTRAMUSCULAR | Status: AC
  Administered 2019-01-25: 22:00:00 50 ug via INTRAVENOUS
  Filled 2019-01-25: qty 2

## 2019-01-25 MED ORDER — ROCURONIUM BROMIDE 10 MG/ML (PF) SYRINGE
PREFILLED_SYRINGE | INTRAVENOUS | Status: AC
  Filled 2019-01-25: qty 10

## 2019-01-25 MED ORDER — LIDOCAINE 2% (20 MG/ML) 5 ML SYRINGE
INTRAMUSCULAR | Status: AC
  Filled 2019-01-25: qty 5

## 2019-01-25 MED ORDER — SUCCINYLCHOLINE CHLORIDE 200 MG/10ML IV SOSY
PREFILLED_SYRINGE | INTRAVENOUS | Status: AC
  Filled 2019-01-25: qty 10

## 2019-01-25 MED ORDER — ONDANSETRON HCL 4 MG/2ML IJ SOLN
INTRAMUSCULAR | Status: AC
  Filled 2019-01-25: qty 2

## 2019-01-25 SURGICAL SUPPLY — 54 items
BLADE CLIPPER SURG (BLADE) IMPLANT
BNDG GAUZE ELAST 4 BULKY (GAUZE/BANDAGES/DRESSINGS) ×3 IMPLANT
CANISTER SUCT 3000ML PPV (MISCELLANEOUS) ×3 IMPLANT
CHLORAPREP W/TINT 26 (MISCELLANEOUS) ×3 IMPLANT
COVER SURGICAL LIGHT HANDLE (MISCELLANEOUS) ×3 IMPLANT
COVER WAND RF STERILE (DRAPES) ×3 IMPLANT
DRAPE LAPAROSCOPIC ABDOMINAL (DRAPES) ×3 IMPLANT
DRAPE WARM FLUID 44X44 (DRAPES) ×3 IMPLANT
DRSG OPSITE POSTOP 4X10 (GAUZE/BANDAGES/DRESSINGS) IMPLANT
DRSG OPSITE POSTOP 4X8 (GAUZE/BANDAGES/DRESSINGS) IMPLANT
ELECT BLADE 4.0 EZ CLEAN MEGAD (MISCELLANEOUS) ×3
ELECT BLADE 6.5 EXT (BLADE) IMPLANT
ELECT CAUTERY BLADE 6.4 (BLADE) ×6 IMPLANT
ELECT REM PT RETURN 9FT ADLT (ELECTROSURGICAL) ×3
ELECTRODE BLDE 4.0 EZ CLN MEGD (MISCELLANEOUS) ×1 IMPLANT
ELECTRODE REM PT RTRN 9FT ADLT (ELECTROSURGICAL) ×1 IMPLANT
GLOVE BIO SURGEON STRL SZ 6.5 (GLOVE) ×2 IMPLANT
GLOVE BIO SURGEON STRL SZ7 (GLOVE) ×3 IMPLANT
GLOVE BIO SURGEON STRL SZ8 (GLOVE) ×6 IMPLANT
GLOVE BIO SURGEONS STRL SZ 6.5 (GLOVE) ×1
GLOVE BIOGEL PI IND STRL 7.0 (GLOVE) ×1 IMPLANT
GLOVE BIOGEL PI IND STRL 8 (GLOVE) ×2 IMPLANT
GLOVE BIOGEL PI INDICATOR 7.0 (GLOVE) ×2
GLOVE BIOGEL PI INDICATOR 8 (GLOVE) ×4
GOWN STRL REUS W/ TWL LRG LVL3 (GOWN DISPOSABLE) ×1 IMPLANT
GOWN STRL REUS W/ TWL XL LVL3 (GOWN DISPOSABLE) ×1 IMPLANT
GOWN STRL REUS W/TWL LRG LVL3 (GOWN DISPOSABLE) ×2
GOWN STRL REUS W/TWL XL LVL3 (GOWN DISPOSABLE) ×2
HANDLE SUCTION POOLE (INSTRUMENTS) ×1 IMPLANT
KIT BASIN OR (CUSTOM PROCEDURE TRAY) ×3 IMPLANT
KIT TURNOVER KIT B (KITS) ×3 IMPLANT
LIGASURE IMPACT 36 18CM CVD LR (INSTRUMENTS) ×3 IMPLANT
NS IRRIG 1000ML POUR BTL (IV SOLUTION) ×6 IMPLANT
PACK GENERAL/GYN (CUSTOM PROCEDURE TRAY) ×3 IMPLANT
PAD ABD 8X10 STRL (GAUZE/BANDAGES/DRESSINGS) ×3 IMPLANT
PAD ARMBOARD 7.5X6 YLW CONV (MISCELLANEOUS) ×3 IMPLANT
PENCIL SMOKE EVACUATOR (MISCELLANEOUS) ×9 IMPLANT
RELOAD PROXIMATE 75MM BLUE (ENDOMECHANICALS) ×6 IMPLANT
SPECIMEN JAR LARGE (MISCELLANEOUS) ×3 IMPLANT
SPONGE LAP 18X18 RF (DISPOSABLE) ×12 IMPLANT
STAPLER GUN LINEAR PROX 60 (STAPLE) ×3 IMPLANT
STAPLER PROXIMATE 75MM BLUE (STAPLE) ×3 IMPLANT
STAPLER VISISTAT 35W (STAPLE) ×3 IMPLANT
SUCTION POOLE HANDLE (INSTRUMENTS) ×3
SUT PDS AB 1 TP1 96 (SUTURE) ×6 IMPLANT
SUT SILK 2 0 SH CR/8 (SUTURE) ×6 IMPLANT
SUT SILK 2 0 TIES 10X30 (SUTURE) ×3 IMPLANT
SUT SILK 3 0 SH CR/8 (SUTURE) ×3 IMPLANT
SUT SILK 3 0 TIES 10X30 (SUTURE) ×3 IMPLANT
TAPE CLOTH SURG 4X10 WHT LF (GAUZE/BANDAGES/DRESSINGS) ×3 IMPLANT
TAPE CLOTH SURG 6X10 WHT LF (GAUZE/BANDAGES/DRESSINGS) ×3 IMPLANT
TOWEL GREEN STERILE (TOWEL DISPOSABLE) ×3 IMPLANT
TRAY FOLEY MTR SLVR 16FR STAT (SET/KITS/TRAYS/PACK) IMPLANT
YANKAUER SUCT BULB TIP NO VENT (SUCTIONS) IMPLANT

## 2019-01-25 NOTE — ED Notes (Signed)
Provider Grandville Silos bedside explaining surgery, pt agreed.  Daughter also bedside and agreed, she signed consent for treatment.

## 2019-01-25 NOTE — Progress Notes (Signed)
Received request for consultation given right anterior ring pelvic fractures which appear to be non or minimally displaced. I do not appreciate clear evidence of SI widening.  We will assess formally in the am.  Gail Ovando, MD [facility], [facility] [phone]

## 2019-01-25 NOTE — ED Notes (Signed)
[place] spoke to Neuro Surgery.  Provider Grandville Silos bedside speaking to [place].

## 2019-01-25 NOTE — ED Notes (Signed)
Provider requested POC COVID, completed.
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
## 2019-01-25 NOTE — Progress Notes (Signed)
Assisted primary RN with IV start, medication admin, warming measures and wound care. Pt to CT with primary RN on CCM
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
## 2019-01-26 ENCOUNTER — Emergency Department (HOSPITAL_COMMUNITY): Payer: Medicaid Other | Admitting: Registered Nurse

## 2019-01-26 ENCOUNTER — Inpatient Hospital Stay (HOSPITAL_COMMUNITY): Payer: Medicaid Other

## 2019-01-26 ENCOUNTER — Other Ambulatory Visit: Payer: Self-pay

## 2019-01-26 ENCOUNTER — Encounter: Payer: Self-pay | Admitting: *Deleted

## 2019-01-26 DIAGNOSIS — Z853 Personal history of malignant neoplasm of breast: Secondary | ICD-10-CM | POA: Diagnosis not present

## 2019-01-26 DIAGNOSIS — S36438A Laceration of other part of small intestine, initial encounter: Secondary | ICD-10-CM | POA: Diagnosis present

## 2019-01-26 DIAGNOSIS — Z7982 Long term (current) use of aspirin: Secondary | ICD-10-CM | POA: Diagnosis not present

## 2019-01-26 DIAGNOSIS — S32810A Multiple fractures of pelvis with stable disruption of pelvic ring, initial encounter for closed fracture: Secondary | ICD-10-CM | POA: Diagnosis present

## 2019-01-26 DIAGNOSIS — N179 Acute kidney failure, unspecified: Secondary | ICD-10-CM | POA: Diagnosis not present

## 2019-01-26 DIAGNOSIS — E119 Type 2 diabetes mellitus without complications: Secondary | ICD-10-CM | POA: Diagnosis present

## 2019-01-26 DIAGNOSIS — Z87891 Personal history of nicotine dependence: Secondary | ICD-10-CM | POA: Diagnosis not present

## 2019-01-26 DIAGNOSIS — S36892A Contusion of other intra-abdominal organs, initial encounter: Secondary | ICD-10-CM | POA: Diagnosis present

## 2019-01-26 DIAGNOSIS — S80212A Abrasion, left knee, initial encounter: Secondary | ICD-10-CM | POA: Diagnosis present

## 2019-01-26 DIAGNOSIS — S3681XA Injury of peritoneum, initial encounter: Secondary | ICD-10-CM | POA: Diagnosis present

## 2019-01-26 DIAGNOSIS — E785 Hyperlipidemia, unspecified: Secondary | ICD-10-CM | POA: Diagnosis present

## 2019-01-26 DIAGNOSIS — S0081XA Abrasion of other part of head, initial encounter: Secondary | ICD-10-CM | POA: Diagnosis present

## 2019-01-26 DIAGNOSIS — Z9011 Acquired absence of right breast and nipple: Secondary | ICD-10-CM | POA: Diagnosis not present

## 2019-01-26 DIAGNOSIS — J45909 Unspecified asthma, uncomplicated: Secondary | ICD-10-CM | POA: Diagnosis present

## 2019-01-26 DIAGNOSIS — R42 Dizziness and giddiness: Secondary | ICD-10-CM | POA: Diagnosis present

## 2019-01-26 DIAGNOSIS — S32059A Unspecified fracture of fifth lumbar vertebra, initial encounter for closed fracture: Secondary | ICD-10-CM | POA: Diagnosis present

## 2019-01-26 DIAGNOSIS — Z23 Encounter for immunization: Secondary | ICD-10-CM | POA: Diagnosis not present

## 2019-01-26 DIAGNOSIS — Z20828 Contact with and (suspected) exposure to other viral communicable diseases: Secondary | ICD-10-CM | POA: Diagnosis present

## 2019-01-26 DIAGNOSIS — Y9301 Activity, walking, marching and hiking: Secondary | ICD-10-CM | POA: Diagnosis present

## 2019-01-26 DIAGNOSIS — Z9049 Acquired absence of other specified parts of digestive tract: Secondary | ICD-10-CM

## 2019-01-26 DIAGNOSIS — D62 Acute posthemorrhagic anemia: Secondary | ICD-10-CM | POA: Diagnosis not present

## 2019-01-26 DIAGNOSIS — K567 Ileus, unspecified: Secondary | ICD-10-CM | POA: Diagnosis not present

## 2019-01-26 DIAGNOSIS — S80211A Abrasion, right knee, initial encounter: Secondary | ICD-10-CM | POA: Diagnosis present

## 2019-01-26 DIAGNOSIS — S080XXA Avulsion of scalp, initial encounter: Secondary | ICD-10-CM | POA: Diagnosis present

## 2019-01-26 DIAGNOSIS — I10 Essential (primary) hypertension: Secondary | ICD-10-CM | POA: Diagnosis present

## 2019-01-26 DIAGNOSIS — S02113A Unspecified occipital condyle fracture, initial encounter for closed fracture: Secondary | ICD-10-CM | POA: Diagnosis present

## 2019-01-26 LAB — BASIC METABOLIC PANEL
Anion gap: 14 (ref 5–15)
BUN: 16 mg/dL (ref 8–23)
CO2: 19 mmol/L — ABNORMAL LOW (ref 22–32)
Calcium: 7.9 mg/dL — ABNORMAL LOW (ref 8.9–10.3)
Chloride: 109 mmol/L (ref 98–111)
Creatinine, Ser: 1.27 mg/dL — ABNORMAL HIGH (ref 0.44–1.00)
GFR calc Af Amer: 52 mL/min — ABNORMAL LOW (ref >60)
GFR calc non Af Amer: 45 mL/min — ABNORMAL LOW (ref >60)
Glucose, Bld: 289 mg/dL — ABNORMAL HIGH (ref 70–99)
Potassium: 3.5 mmol/L (ref 3.5–5.1)
Sodium: 142 mmol/L (ref 135–145)

## 2019-01-26 LAB — GLUCOSE, CAPILLARY
Glucose-Capillary: 146 mg/dL — ABNORMAL HIGH (ref 70–99)
Glucose-Capillary: 154 mg/dL — ABNORMAL HIGH (ref 70–99)
Glucose-Capillary: 162 mg/dL — ABNORMAL HIGH (ref 70–99)
Glucose-Capillary: 177 mg/dL — ABNORMAL HIGH (ref 70–99)
Glucose-Capillary: 244 mg/dL — ABNORMAL HIGH (ref 70–99)
Glucose-Capillary: 278 mg/dL — ABNORMAL HIGH (ref 70–99)

## 2019-01-26 LAB — RESPIRATORY PANEL BY RT PCR (FLU A&B, COVID)
Influenza A by PCR: NEGATIVE
Influenza B by PCR: NEGATIVE
SARS Coronavirus 2 by RT PCR: NEGATIVE

## 2019-01-26 LAB — MAGNESIUM: Magnesium: 1.6 mg/dL — ABNORMAL LOW (ref 1.7–2.4)

## 2019-01-26 LAB — HIV ANTIBODY (ROUTINE TESTING W REFLEX): HIV Screen 4th Generation wRfx: NONREACTIVE

## 2019-01-26 LAB — CBC
HCT: 30.9 % — ABNORMAL LOW (ref 36.0–46.0)
Hemoglobin: 9.8 g/dL — ABNORMAL LOW (ref 12.0–15.0)
MCH: 29.8 pg (ref 26.0–34.0)
MCHC: 31.7 g/dL (ref 30.0–36.0)
MCV: 93.9 fL (ref 80.0–100.0)
Platelets: 394 10*3/uL (ref 150–400)
RBC: 3.29 MIL/uL — ABNORMAL LOW (ref 3.87–5.11)
RDW: 13.3 % (ref 11.5–15.5)
WBC: 36.4 10*3/uL — ABNORMAL HIGH (ref 4.0–10.5)
nRBC: 0 % (ref 0.0–0.2)

## 2019-01-26 LAB — HEMOGLOBIN A1C
Hgb A1c MFr Bld: 7.2 % — ABNORMAL HIGH (ref 4.8–5.6)
Mean Plasma Glucose: 159.94 mg/dL

## 2019-01-26 LAB — ABO/RH: ABO/RH(D): O POS

## 2019-01-26 LAB — PHOSPHORUS: Phosphorus: 4.4 mg/dL (ref 2.5–4.6)

## 2019-01-26 LAB — LACTIC ACID, PLASMA: Lactic Acid, Venous: 2.8 mmol/L (ref 0.5–1.9)

## 2019-01-26 LAB — MRSA PCR SCREENING: MRSA by PCR: NEGATIVE

## 2019-01-26 MED ORDER — HYDROMORPHONE HCL 1 MG/ML IJ SOLN
1.0000 mg | INTRAMUSCULAR | Status: DC | PRN
  Administered 2019-01-26 – 2019-01-30 (×15): 1 mg via INTRAVENOUS
  Filled 2019-01-26 (×15): qty 1

## 2019-01-26 MED ORDER — LACTATED RINGERS IV SOLN
INTRAVENOUS | Status: DC | PRN
  Administered 2019-01-26 (×2): via INTRAVENOUS

## 2019-01-26 MED ORDER — POTASSIUM CHLORIDE IN NACL 20-0.9 MEQ/L-% IV SOLN
INTRAVENOUS | Status: DC
  Administered 2019-01-26: 05:00:00 via INTRAVENOUS
  Filled 2019-01-26: qty 1000

## 2019-01-26 MED ORDER — LACTATED RINGERS IV SOLN
INTRAVENOUS | Status: DC
  Administered 2019-01-26 (×2): via INTRAVENOUS

## 2019-01-26 MED ORDER — MIDAZOLAM HCL 5 MG/5ML IJ SOLN
INTRAMUSCULAR | Status: DC | PRN
  Administered 2019-01-26: 2 mg via INTRAVENOUS

## 2019-01-26 MED ORDER — ONDANSETRON HCL 4 MG/2ML IJ SOLN
INTRAMUSCULAR | Status: DC | PRN
  Administered 2019-01-26: 4 mg via INTRAVENOUS

## 2019-01-26 MED ORDER — PROPOFOL 10 MG/ML IV BOLUS
INTRAVENOUS | Status: DC | PRN
  Administered 2019-01-26: 150 mg via INTRAVENOUS

## 2019-01-26 MED ORDER — ENOXAPARIN SODIUM 30 MG/0.3ML ~~LOC~~ SOLN
30.0000 mg | Freq: Two times a day (BID) | SUBCUTANEOUS | Status: DC
  Administered 2019-01-26 – 2019-01-27 (×2): 30 mg via SUBCUTANEOUS
  Filled 2019-01-26 (×4): qty 0.3

## 2019-01-26 MED ORDER — INSULIN ASPART 100 UNIT/ML ~~LOC~~ SOLN
11.0000 [IU] | Freq: Once | SUBCUTANEOUS | Status: AC
  Administered 2019-01-26: 11 [IU] via SUBCUTANEOUS

## 2019-01-26 MED ORDER — POTASSIUM CHLORIDE 10 MEQ/100ML IV SOLN
10.0000 meq | INTRAVENOUS | Status: AC
  Administered 2019-01-26 (×5): 10 meq via INTRAVENOUS
  Filled 2019-01-26 (×5): qty 100

## 2019-01-26 MED ORDER — ACETAMINOPHEN 10 MG/ML IV SOLN: 1000.0000 mg | Freq: Once | INTRAVENOUS | Status: DC | PRN

## 2019-01-26 MED ORDER — INSULIN ASPART 100 UNIT/ML ~~LOC~~ SOLN
0.0000 [IU] | Freq: Three times a day (TID) | SUBCUTANEOUS | Status: DC
  Administered 2019-01-26: 12:00:00 7 [IU] via SUBCUTANEOUS
  Administered 2019-01-26 – 2019-01-27 (×2): 4 [IU] via SUBCUTANEOUS
  Administered 2019-01-27 – 2019-01-29 (×6): 3 [IU] via SUBCUTANEOUS
  Administered 2019-01-29 (×2): 4 [IU] via SUBCUTANEOUS
  Administered 2019-01-30: 18:00:00 3 [IU] via SUBCUTANEOUS
  Administered 2019-01-30: 12:00:00 4 [IU] via SUBCUTANEOUS
  Administered 2019-01-30: 09:00:00 3 [IU] via SUBCUTANEOUS
  Administered 2019-01-31: 18:00:00 4 [IU] via SUBCUTANEOUS
  Administered 2019-01-31 – 2019-02-01 (×2): 3 [IU] via SUBCUTANEOUS
  Administered 2019-02-01: 13:00:00 4 [IU] via SUBCUTANEOUS
  Administered 2019-02-01: 3 [IU] via SUBCUTANEOUS
  Administered 2019-02-02: 4 [IU] via SUBCUTANEOUS
  Administered 2019-02-02: 3 [IU] via SUBCUTANEOUS

## 2019-01-26 MED ORDER — PROMETHAZINE HCL 25 MG/ML IJ SOLN: 6.2500 mg | INTRAMUSCULAR | Status: DC | PRN

## 2019-01-26 MED ORDER — PHENYLEPHRINE 40 MCG/ML (10ML) SYRINGE FOR IV PUSH (FOR BLOOD PRESSURE SUPPORT)
PREFILLED_SYRINGE | INTRAVENOUS | Status: DC | PRN
  Administered 2019-01-26: 120 ug via INTRAVENOUS
  Administered 2019-01-26: 80 ug via INTRAVENOUS
  Administered 2019-01-26: 120 ug via INTRAVENOUS
  Administered 2019-01-26: 80 ug via INTRAVENOUS
  Administered 2019-01-26: 120 ug via INTRAVENOUS

## 2019-01-26 MED ORDER — CHLORHEXIDINE GLUCONATE CLOTH 2 % EX PADS
6.0000 | MEDICATED_PAD | Freq: Every day | CUTANEOUS | Status: DC
  Administered 2019-01-27 – 2019-02-02 (×5): 6 via TOPICAL

## 2019-01-26 MED ORDER — SODIUM CHLORIDE 0.9 % IV SOLN
INTRAVENOUS | Status: DC | PRN
  Administered 2019-01-26 (×2): via INTRAVENOUS

## 2019-01-26 MED ORDER — METHOCARBAMOL 1000 MG/10ML IJ SOLN: 1000.0000 mg | Freq: Three times a day (TID) | INTRAVENOUS | Status: DC | PRN

## 2019-01-26 MED ORDER — ROCURONIUM BROMIDE 10 MG/ML (PF) SYRINGE
PREFILLED_SYRINGE | INTRAVENOUS | Status: DC | PRN
  Administered 2019-01-26 (×2): 10 mg via INTRAVENOUS
  Administered 2019-01-26: 50 mg via INTRAVENOUS

## 2019-01-26 MED ORDER — HYDROMORPHONE HCL 1 MG/ML IJ SOLN: 0.5000 mg | INTRAMUSCULAR | Status: DC | PRN

## 2019-01-26 MED ORDER — 0.9 % SODIUM CHLORIDE (POUR BTL) OPTIME
TOPICAL | Status: DC | PRN
  Administered 2019-01-26 (×4): 1000 mL

## 2019-01-26 MED ORDER — CEFAZOLIN SODIUM-DEXTROSE 2-3 GM-%(50ML) IV SOLR
INTRAVENOUS | Status: DC | PRN
  Administered 2019-01-26: 2 g via INTRAVENOUS

## 2019-01-26 MED ORDER — LACTATED RINGERS IV SOLN: INTRAVENOUS | Status: DC

## 2019-01-26 MED ORDER — METHOCARBAMOL 1000 MG/10ML IJ SOLN
1000.0000 mg | Freq: Three times a day (TID) | INTRAVENOUS | Status: DC
  Administered 2019-01-26 – 2019-01-30 (×11): 1000 mg via INTRAVENOUS
  Filled 2019-01-26 (×13): qty 10

## 2019-01-26 MED ORDER — LACTATED RINGERS IV BOLUS
500.0000 mL | Freq: Once | INTRAVENOUS | Status: AC
  Administered 2019-01-26: 18:00:00 500 mL via INTRAVENOUS

## 2019-01-26 MED ORDER — HYDROMORPHONE HCL 1 MG/ML IJ SOLN
0.2500 mg | INTRAMUSCULAR | Status: DC | PRN
  Administered 2019-01-26 (×2): 0.5 mg via INTRAVENOUS

## 2019-01-26 MED ORDER — HYDROMORPHONE HCL 1 MG/ML IJ SOLN
INTRAMUSCULAR | Status: AC
  Filled 2019-01-26: qty 2

## 2019-01-26 MED ORDER — PHENYLEPHRINE HCL-NACL 10-0.9 MG/250ML-% IV SOLN
INTRAVENOUS | Status: DC | PRN
  Administered 2019-01-26: 50 ug/min via INTRAVENOUS

## 2019-01-26 MED ORDER — SUCCINYLCHOLINE CHLORIDE 200 MG/10ML IV SOSY
PREFILLED_SYRINGE | INTRAVENOUS | Status: DC | PRN
  Administered 2019-01-26: 80 mg via INTRAVENOUS

## 2019-01-26 MED ORDER — SUGAMMADEX SODIUM 200 MG/2ML IV SOLN
INTRAVENOUS | Status: DC | PRN
  Administered 2019-01-26: 100 mg via INTRAVENOUS
  Administered 2019-01-26 (×2): 50 mg via INTRAVENOUS
  Administered 2019-01-26: 100 mg via INTRAVENOUS

## 2019-01-26 MED ORDER — ALBUTEROL SULFATE HFA 108 (90 BASE) MCG/ACT IN AERS
INHALATION_SPRAY | RESPIRATORY_TRACT | Status: DC | PRN
  Administered 2019-01-26: 6 via RESPIRATORY_TRACT

## 2019-01-26 MED ORDER — MAGNESIUM SULFATE 2 GM/50ML IV SOLN
2.0000 g | Freq: Once | INTRAVENOUS | Status: AC
  Administered 2019-01-26: 2 g via INTRAVENOUS
  Filled 2019-01-26: qty 50

## 2019-01-26 MED ORDER — ALBUMIN HUMAN 5 % IV SOLN
INTRAVENOUS | Status: DC | PRN
  Administered 2019-01-26 (×2): via INTRAVENOUS

## 2019-01-26 MED ORDER — OXYCODONE HCL 5 MG PO TABS: 5.0000 mg | ORAL_TABLET | Freq: Once | ORAL | Status: DC | PRN

## 2019-01-26 MED ORDER — FENTANYL CITRATE (PF) 250 MCG/5ML IJ SOLN
INTRAMUSCULAR | Status: DC | PRN
  Administered 2019-01-26 (×6): 50 ug via INTRAVENOUS

## 2019-01-26 MED ORDER — FENTANYL CITRATE (PF) 250 MCG/5ML IJ SOLN
INTRAMUSCULAR | Status: AC
  Filled 2019-01-26: qty 5

## 2019-01-26 MED ORDER — FENTANYL CITRATE (PF) 100 MCG/2ML IJ SOLN
INTRAMUSCULAR | Status: AC
  Filled 2019-01-26: qty 2

## 2019-01-26 MED ORDER — LIDOCAINE 2% (20 MG/ML) 5 ML SYRINGE
INTRAMUSCULAR | Status: DC | PRN
  Administered 2019-01-26: 60 mg via INTRAVENOUS

## 2019-01-26 MED ORDER — DEXAMETHASONE SODIUM PHOSPHATE 10 MG/ML IJ SOLN
INTRAMUSCULAR | Status: DC | PRN
  Administered 2019-01-26: 4 mg via INTRAVENOUS

## 2019-01-26 MED ORDER — ORAL CARE MOUTH RINSE
15.0000 mL | Freq: Two times a day (BID) | OROMUCOSAL | Status: DC
  Administered 2019-01-26 – 2019-02-02 (×14): 15 mL via OROMUCOSAL

## 2019-01-26 MED ORDER — OXYCODONE HCL 5 MG/5ML PO SOLN: 5.0000 mg | Freq: Once | ORAL | Status: DC | PRN
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
## 2019-01-26 NOTE — Progress Notes (Signed)
OT Cancellation Note  Patient Details Name: Gail Stevenson MRN: [id number] DOB: Apr 14, 1955   Cancelled Treatment:    Reason Eval/Treat Not Completed: Patient not medically ready.  Waiting on ortho consult re: pelvic fractures.    Lucille Passy, OTR/L [place] Pager [phone] Office [phone]   Lucille Passy M 01/26/2019, 11:08 AM

## 2019-01-26 NOTE — Plan of Care (Signed)
  Problem: Clinical Measurements: Goal: Ability to maintain clinical measurements within normal limits will improve Outcome: Progressing   

## 2019-01-26 NOTE — Anesthesia Procedure Notes (Signed)
Procedure Name: Intubation Date/Time: 01/26/2019 12:49 AM Performed by: Jearld Pies, CRNA Pre-anesthesia Checklist: Patient identified, Emergency Drugs available, Suction available and Patient being monitored Patient Re-evaluated:Patient Re-evaluated prior to induction Oxygen Delivery Method: Circle System Utilized Preoxygenation: Pre-oxygenation with 100% oxygen Induction Type: IV induction and Rapid sequence Laryngoscope Size: Glidescope and 3 Grade View: Grade II Tube type: Oral Tube size: 7.0 mm Number of attempts: 1 Airway Equipment and Method: Stylet Placement Confirmation: ETT inserted through vocal cords under direct vision,  positive ETCO2 and breath sounds checked- equal and bilateral Secured at: 22 cm Tube secured with: Tape Dental Injury: Teeth and Oropharynx as per pre-operative assessment  Comments: Glidescope utilized d/t COVID19 precautions
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
## 2019-01-26 NOTE — Anesthesia Preprocedure Evaluation (Signed)
Anesthesia Evaluation    Reviewed: Allergy & Precautions, Patient's Chart, lab work & pertinent test resultsPreop documentation limited or incomplete due to emergent nature of procedure.  Airway        Dental   Pulmonary asthma , former smoker,           Cardiovascular hypertension, Pt. on medications   ECG: SR, rate 79   Neuro/Psych PSYCHIATRIC DISORDERS Depression negative neurological ROS     GI/Hepatic (+) Hepatitis -, CSmall bowel mesenteric injuries with hemorrhage    Endo/Other  diabetes, Oral Hypoglycemic Agents, Insulin Dependent  Renal/GU negative Renal ROS     Musculoskeletal R pubic rami FXs   Abdominal (+) + obese,   Peds  Hematology HLD   Anesthesia Other Findings Abdominal bleed  Reproductive/Obstetrics                             Anesthesia Physical Anesthesia Plan  ASA: III and emergent  Anesthesia Plan: General   Post-op Pain Management:    Induction: Intravenous and Rapid sequence  PONV Risk Score and Plan: 4 or greater and Ondansetron, Dexamethasone, Midazolam and Treatment may vary due to age or medical condition  Airway Management Planned: Oral ETT  Additional Equipment:   Intra-op Plan:   Post-operative Plan: Possible Post-op intubation/ventilation  Informed Consent:     Only emergency history available  Plan Discussed with:   Anesthesia Plan Comments:         Anesthesia Quick Evaluation

## 2019-01-26 NOTE — Progress Notes (Signed)
Patient transferred to unit from 4 North, VSS. Patient settled in bed and oriented to unit and hospital routine. Patient has midline abdominal incision in place with abd pad. NG tube in place to low intermittent suction per orders. Patient placed on Tele and continuous pulse oximetry per orders. Pt resting in bed with call bell in reach and side rails up. Patient medicated for complaints of pain. Instructed patient to utilize call bell for assistance. Will continue to monitor closely for remainder of shift.
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
## 2019-01-26 NOTE — Progress Notes (Signed)
Received call from [place], [place] noted tachy([phone]). Notified on call Dr Bobbye Morton via text page.

## 2019-01-26 NOTE — Transfer of Care (Signed)
Immediate Anesthesia Transfer of Care Note  Patient: Gail Stevenson  Procedure(s) Performed: EXPLORATORY LAPAROTOMY, ILEOCECECTOMY (N/A Abdomen)  Patient Location: PACU  Anesthesia Type:General  Level of Consciousness: awake, alert  and oriented  Airway & Oxygen Therapy: Patient Spontanous Breathing and Patient connected to face mask oxygen  Post-op Assessment: Report given to RN and Post -op Vital signs reviewed and stable  Post vital signs: Reviewed and stable  Last Vitals:  Vitals Value Taken Time  BP 148/65 01/26/19 0313  Temp    Pulse 104 01/26/19 0313  Resp 21 01/26/19 0313  SpO2 100 % 01/26/19 0313  Vitals shown include unvalidated device data.  Last Pain:  Vitals:   01/25/19 2255  TempSrc:   PainSc: 7          Complications: No apparent anesthesia complications

## 2019-01-26 NOTE — Anesthesia Postprocedure Evaluation (Signed)
Anesthesia Post Note  Patient: Gail Stevenson  Procedure(s) Performed: EXPLORATORY LAPAROTOMY, ILEOCECECTOMY (N/A Abdomen)     Patient location during evaluation: PACU Anesthesia Type: General Level of consciousness: awake Pain management: pain level controlled Vital Signs Assessment: post-procedure vital signs reviewed and stable Respiratory status: spontaneous breathing, nonlabored ventilation, respiratory function stable and patient connected to nasal cannula oxygen Cardiovascular status: blood pressure returned to baseline and stable Postop Assessment: no apparent nausea or vomiting Anesthetic complications: no    Last Vitals:  Vitals:   01/26/19 0400 01/26/19 0415  BP: (!) 164/71 (!) 172/91  Pulse: 99 99  Resp: 16 20  Temp: 36.4 C 36.6 C  SpO2: 96% 95%    Last Pain:  Vitals:   01/26/19 0415  TempSrc: Oral  PainSc:                  Gail Stevenson Gail Stevenson
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
## 2019-01-27 LAB — GLUCOSE, CAPILLARY
Glucose-Capillary: 139 mg/dL — ABNORMAL HIGH (ref 70–99)
Glucose-Capillary: 147 mg/dL — ABNORMAL HIGH (ref 70–99)
Glucose-Capillary: 156 mg/dL — ABNORMAL HIGH (ref 70–99)
Glucose-Capillary: 126 mg/dL — ABNORMAL HIGH (ref 70–99)
Glucose-Capillary: 123 mg/dL — ABNORMAL HIGH (ref 70–99)

## 2019-01-27 LAB — BASIC METABOLIC PANEL
Anion gap: 10 (ref 5–15)
BUN: 35 mg/dL — ABNORMAL HIGH (ref 8–23)
CO2: 24 mmol/L (ref 22–32)
Calcium: 8.3 mg/dL — ABNORMAL LOW (ref 8.9–10.3)
Chloride: 107 mmol/L (ref 98–111)
Creatinine, Ser: 2.24 mg/dL — ABNORMAL HIGH (ref 0.44–1.00)
GFR calc Af Amer: 26 mL/min — ABNORMAL LOW (ref >60)
GFR calc non Af Amer: 22 mL/min — ABNORMAL LOW (ref >60)
Glucose, Bld: 190 mg/dL — ABNORMAL HIGH (ref 70–99)
Potassium: 4.3 mmol/L (ref 3.5–5.1)
Sodium: 141 mmol/L (ref 135–145)

## 2019-01-27 LAB — PREPARE RBC (CROSSMATCH)

## 2019-01-27 LAB — CBC
HCT: 22.5 % — ABNORMAL LOW (ref 36.0–46.0)
HCT: 25.3 % — ABNORMAL LOW (ref 36.0–46.0)
Hemoglobin: 7.3 g/dL — ABNORMAL LOW (ref 12.0–15.0)
Hemoglobin: 8.5 g/dL — ABNORMAL LOW (ref 12.0–15.0)
MCH: 29.9 pg (ref 26.0–34.0)
MCH: 30.8 pg (ref 26.0–34.0)
MCHC: 32.4 g/dL (ref 30.0–36.0)
MCHC: 33.6 g/dL (ref 30.0–36.0)
MCV: 92.2 fL (ref 80.0–100.0)
MCV: 91.7 fL (ref 80.0–100.0)
Platelets: 313 10*3/uL (ref 150–400)
Platelets: 247 10*3/uL (ref 150–400)
RBC: 2.44 MIL/uL — ABNORMAL LOW (ref 3.87–5.11)
RBC: 2.76 MIL/uL — ABNORMAL LOW (ref 3.87–5.11)
RDW: 13.9 % (ref 11.5–15.5)
RDW: 13.6 % (ref 11.5–15.5)
WBC: 34.6 10*3/uL — ABNORMAL HIGH (ref 4.0–10.5)
WBC: 32.1 10*3/uL — ABNORMAL HIGH (ref 4.0–10.5)
nRBC: 0 % (ref 0.0–0.2)
nRBC: 0 % (ref 0.0–0.2)

## 2019-01-27 LAB — MAGNESIUM: Magnesium: 2 mg/dL (ref 1.7–2.4)

## 2019-01-27 MED ORDER — IPRATROPIUM-ALBUTEROL 0.5-2.5 (3) MG/3ML IN SOLN: 3.0000 mL | Freq: Four times a day (QID) | RESPIRATORY_TRACT | Status: DC | PRN

## 2019-01-27 MED ORDER — ONDANSETRON HCL 4 MG/2ML IJ SOLN
4.0000 mg | Freq: Four times a day (QID) | INTRAMUSCULAR | Status: DC | PRN
  Administered 2019-01-29 – 2019-02-01 (×3): 4 mg via INTRAVENOUS
  Filled 2019-01-27 (×3): qty 2

## 2019-01-27 MED ORDER — SODIUM CHLORIDE 0.9% IV SOLUTION: Freq: Once | INTRAVENOUS | Status: AC

## 2019-01-27 MED ORDER — MOMETASONE FURO-FORMOTEROL FUM 200-5 MCG/ACT IN AERO
2.0000 | INHALATION_SPRAY | Freq: Two times a day (BID) | RESPIRATORY_TRACT | Status: DC
  Administered 2019-01-27 – 2019-02-02 (×12): 2 via RESPIRATORY_TRACT
  Filled 2019-01-27: qty 8.8

## 2019-01-27 MED ORDER — ONDANSETRON HCL 4 MG/2ML IJ SOLN
4.0000 mg | INTRAMUSCULAR | Status: AC
  Administered 2019-01-27: 13:00:00 4 mg via INTRAVENOUS
  Filled 2019-01-27: qty 2
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
## 2019-01-27 NOTE — Plan of Care (Signed)
  Problem: Clinical Measurements: Goal: Respiratory complications will improve Outcome: Progressing   Problem: Coping: Goal: Level of anxiety will decrease Outcome: Progressing   Problem: Pain Managment: Goal: General experience of comfort will improve Outcome: Progressing   Problem: Skin Integrity: Goal: Risk for impaired skin integrity will decrease Outcome: Progressing   

## 2019-01-27 NOTE — Plan of Care (Signed)
  Problem: Pain Managment: Goal: General experience of comfort will improve Outcome: Progressing   Problem: Safety: Goal: Ability to remain free from injury will improve Outcome: Progressing   Problem: Skin Integrity: Goal: Risk for impaired skin integrity will decrease Outcome: Progressing   

## 2019-01-27 NOTE — Progress Notes (Signed)
Hgb noted to be 7.3. Cr up from 1.27 to 2.24. Will hold lovenox and give 1U PRBC. Recheck hgb post transfusion.

## 2019-01-27 NOTE — Progress Notes (Signed)
Patient transfused with 1 unit PRBC per MD orders. Patient informed consent was obtained and educated on risks and benefits of transfusion. Patient tolerated transfusion well with no complaints. Patient resting in bed at present. No new concerns or complaints reported at this time. Orders placed for post transfusion CBC tonight at 1900 per MD orders. Will continue to monitor closely for remainder of shift.
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
## 2019-01-28 ENCOUNTER — Inpatient Hospital Stay (HOSPITAL_COMMUNITY): Payer: Medicaid Other

## 2019-01-28 LAB — BASIC METABOLIC PANEL
Anion gap: 10 (ref 5–15)
BUN: 31 mg/dL — ABNORMAL HIGH (ref 8–23)
CO2: 25 mmol/L (ref 22–32)
Calcium: 8.4 mg/dL — ABNORMAL LOW (ref 8.9–10.3)
Chloride: 107 mmol/L (ref 98–111)
Creatinine, Ser: 1.45 mg/dL — ABNORMAL HIGH (ref 0.44–1.00)
GFR calc Af Amer: 44 mL/min — ABNORMAL LOW (ref >60)
GFR calc non Af Amer: 38 mL/min — ABNORMAL LOW (ref >60)
Glucose, Bld: 180 mg/dL — ABNORMAL HIGH (ref 70–99)
Potassium: 4.1 mmol/L (ref 3.5–5.1)
Sodium: 142 mmol/L (ref 135–145)

## 2019-01-28 LAB — BPAM RBC
Blood Product Expiration Date: 202101152359
ISSUE DATE / TIME: 202012151354
Unit Type and Rh: 5100

## 2019-01-28 LAB — GLUCOSE, CAPILLARY
Glucose-Capillary: 142 mg/dL — ABNORMAL HIGH (ref 70–99)
Glucose-Capillary: 140 mg/dL — ABNORMAL HIGH (ref 70–99)
Glucose-Capillary: 143 mg/dL — ABNORMAL HIGH (ref 70–99)
Glucose-Capillary: 114 mg/dL — ABNORMAL HIGH (ref 70–99)

## 2019-01-28 LAB — CBC
HCT: 25.1 % — ABNORMAL LOW (ref 36.0–46.0)
Hemoglobin: 8.1 g/dL — ABNORMAL LOW (ref 12.0–15.0)
MCH: 30.1 pg (ref 26.0–34.0)
MCHC: 32.3 g/dL (ref 30.0–36.0)
MCV: 93.3 fL (ref 80.0–100.0)
Platelets: 264 10*3/uL (ref 150–400)
RBC: 2.69 MIL/uL — ABNORMAL LOW (ref 3.87–5.11)
RDW: 14.1 % (ref 11.5–15.5)
WBC: 30.5 10*3/uL — ABNORMAL HIGH (ref 4.0–10.5)
nRBC: 0 % (ref 0.0–0.2)

## 2019-01-28 LAB — TYPE AND SCREEN
ABO/RH(D): O POS
Antibody Screen: NEGATIVE
Unit division: 0

## 2019-01-28 LAB — SURGICAL PATHOLOGY

## 2019-01-28 MED ORDER — METOPROLOL TARTRATE 5 MG/5ML IV SOLN
5.0000 mg | Freq: Four times a day (QID) | INTRAVENOUS | Status: DC | PRN
  Administered 2019-01-28: 02:00:00 5 mg via INTRAVENOUS
  Filled 2019-01-28: qty 5

## 2019-01-28 MED ORDER — METOPROLOL TARTRATE 5 MG/5ML IV SOLN
5.0000 mg | Freq: Four times a day (QID) | INTRAVENOUS | Status: DC | PRN
  Administered 2019-01-28: 15:00:00 5 mg via INTRAVENOUS
  Filled 2019-01-28: qty 5

## 2019-01-28 MED ORDER — ENOXAPARIN SODIUM 30 MG/0.3ML ~~LOC~~ SOLN
30.0000 mg | Freq: Two times a day (BID) | SUBCUTANEOUS | Status: DC
  Administered 2019-01-29 – 2019-02-02 (×9): 30 mg via SUBCUTANEOUS
  Filled 2019-01-28 (×9): qty 0.3
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
## 2019-01-28 NOTE — Progress Notes (Addendum)
Pt noted with high BP 163/134. Also cont to be tachy 120-135. Notified on call Dr Kae Heller with new order Metoprolol 5mg  IV every 6hrs PRN greater than 123XX123 and or diastolic 123XX123.
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
## 2019-01-29 LAB — CBC
HCT: 29.6 % — ABNORMAL LOW (ref 36.0–46.0)
Hemoglobin: 9.7 g/dL — ABNORMAL LOW (ref 12.0–15.0)
MCH: 30.5 pg (ref 26.0–34.0)
MCHC: 32.8 g/dL (ref 30.0–36.0)
MCV: 93.1 fL (ref 80.0–100.0)
Platelets: 317 10*3/uL (ref 150–400)
RBC: 3.18 MIL/uL — ABNORMAL LOW (ref 3.87–5.11)
RDW: 13.5 % (ref 11.5–15.5)
WBC: 27.4 10*3/uL — ABNORMAL HIGH (ref 4.0–10.5)
nRBC: 0 % (ref 0.0–0.2)

## 2019-01-29 LAB — BASIC METABOLIC PANEL
Anion gap: 8 (ref 5–15)
BUN: 13 mg/dL (ref 8–23)
CO2: 32 mmol/L (ref 22–32)
Calcium: 8.5 mg/dL — ABNORMAL LOW (ref 8.9–10.3)
Chloride: 101 mmol/L (ref 98–111)
Creatinine, Ser: 0.95 mg/dL (ref 0.44–1.00)
GFR calc Af Amer: >60 mL/min (ref >60)
GFR calc non Af Amer: >60 mL/min (ref >60)
Glucose, Bld: 167 mg/dL — ABNORMAL HIGH (ref 70–99)
Potassium: 3.2 mmol/L — ABNORMAL LOW (ref 3.5–5.1)
Sodium: 141 mmol/L (ref 135–145)

## 2019-01-29 LAB — GLUCOSE, CAPILLARY
Glucose-Capillary: 176 mg/dL — ABNORMAL HIGH (ref 70–99)
Glucose-Capillary: 163 mg/dL — ABNORMAL HIGH (ref 70–99)
Glucose-Capillary: 136 mg/dL — ABNORMAL HIGH (ref 70–99)
Glucose-Capillary: 102 mg/dL — ABNORMAL HIGH (ref 70–99)

## 2019-01-29 MED ORDER — FUROSEMIDE 10 MG/ML IJ SOLN
40.0000 mg | Freq: Once | INTRAMUSCULAR | Status: AC
  Administered 2019-01-29: 10:00:00 40 mg via INTRAVENOUS
  Filled 2019-01-29: qty 4

## 2019-01-29 MED ORDER — METOPROLOL TARTRATE 5 MG/5ML IV SOLN
5.0000 mg | Freq: Four times a day (QID) | INTRAVENOUS | Status: DC
  Administered 2019-01-29 – 2019-01-30 (×5): 5 mg via INTRAVENOUS
  Filled 2019-01-29 (×5): qty 5

## 2019-01-29 MED ORDER — POTASSIUM CHLORIDE 10 MEQ/100ML IV SOLN
10.0000 meq | INTRAVENOUS | Status: AC
  Administered 2019-01-29 (×5): 10 meq via INTRAVENOUS
  Filled 2019-01-29 (×5): qty 100

## 2019-01-29 NOTE — Plan of Care (Signed)
  Problem: Pain Managment: Goal: General experience of comfort will improve Outcome: Progressing   Problem: Safety: Goal: Ability to remain free from injury will improve Outcome: Progressing   Problem: Skin Integrity: Goal: Risk for impaired skin integrity will decrease Outcome: Progressing   
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
## 2019-01-29 NOTE — Progress Notes (Signed)
Mews score no acute change.

## 2019-01-30 LAB — PHOSPHORUS: Phosphorus: 2.5 mg/dL (ref 2.5–4.6)

## 2019-01-30 LAB — BASIC METABOLIC PANEL
Anion gap: 12 (ref 5–15)
BUN: 18 mg/dL (ref 8–23)
CO2: 32 mmol/L (ref 22–32)
Calcium: 8.2 mg/dL — ABNORMAL LOW (ref 8.9–10.3)
Chloride: 100 mmol/L (ref 98–111)
Creatinine, Ser: 0.99 mg/dL (ref 0.44–1.00)
GFR calc Af Amer: >60 mL/min (ref >60)
GFR calc non Af Amer: >60 mL/min (ref >60)
Glucose, Bld: 170 mg/dL — ABNORMAL HIGH (ref 70–99)
Potassium: 3.3 mmol/L — ABNORMAL LOW (ref 3.5–5.1)
Sodium: 144 mmol/L (ref 135–145)

## 2019-01-30 LAB — GLUCOSE, CAPILLARY
Glucose-Capillary: 140 mg/dL — ABNORMAL HIGH (ref 70–99)
Glucose-Capillary: 188 mg/dL — ABNORMAL HIGH (ref 70–99)
Glucose-Capillary: 148 mg/dL — ABNORMAL HIGH (ref 70–99)
Glucose-Capillary: 143 mg/dL — ABNORMAL HIGH (ref 70–99)

## 2019-01-30 LAB — CBC
HCT: 26.3 % — ABNORMAL LOW (ref 36.0–46.0)
Hemoglobin: 8.6 g/dL — ABNORMAL LOW (ref 12.0–15.0)
MCH: 30.4 pg (ref 26.0–34.0)
MCHC: 32.7 g/dL (ref 30.0–36.0)
MCV: 92.9 fL (ref 80.0–100.0)
Platelets: 335 10*3/uL (ref 150–400)
RBC: 2.83 MIL/uL — ABNORMAL LOW (ref 3.87–5.11)
RDW: 13.4 % (ref 11.5–15.5)
WBC: 18.8 10*3/uL — ABNORMAL HIGH (ref 4.0–10.5)
nRBC: 0.1 % (ref 0.0–0.2)

## 2019-01-30 LAB — MAGNESIUM: Magnesium: 2 mg/dL (ref 1.7–2.4)

## 2019-01-30 MED ORDER — HYDROCHLOROTHIAZIDE 25 MG PO TABS
25.0000 mg | ORAL_TABLET | Freq: Every day | ORAL | Status: DC
  Administered 2019-01-30 – 2019-02-02 (×4): 25 mg via ORAL
  Filled 2019-01-30 (×4): qty 1

## 2019-01-30 MED ORDER — METHOCARBAMOL 750 MG PO TABS: 750.0000 mg | ORAL_TABLET | Freq: Three times a day (TID) | ORAL | Status: DC | PRN

## 2019-01-30 MED ORDER — ATORVASTATIN CALCIUM 40 MG PO TABS
40.0000 mg | ORAL_TABLET | Freq: Every day | ORAL | Status: DC
  Administered 2019-01-30 – 2019-02-02 (×4): 40 mg via ORAL
  Filled 2019-01-30 (×4): qty 1

## 2019-01-30 MED ORDER — SERTRALINE HCL 50 MG PO TABS
50.0000 mg | ORAL_TABLET | Freq: Every day | ORAL | Status: DC
  Administered 2019-01-30 – 2019-02-02 (×4): 50 mg via ORAL
  Filled 2019-01-30 (×4): qty 1

## 2019-01-30 MED ORDER — ENALAPRIL MALEATE 5 MG PO TABS
20.0000 mg | ORAL_TABLET | Freq: Two times a day (BID) | ORAL | Status: DC
  Administered 2019-01-30 – 2019-02-02 (×7): 20 mg via ORAL
  Filled 2019-01-30 (×7): qty 4

## 2019-01-30 MED ORDER — MENTHOL 3 MG MT LOZG
1.0000 | LOZENGE | OROMUCOSAL | Status: DC | PRN
  Filled 2019-01-30 (×2): qty 9

## 2019-01-30 MED ORDER — METOPROLOL TARTRATE 5 MG/5ML IV SOLN: 5.0000 mg | Freq: Three times a day (TID) | INTRAVENOUS | Status: DC

## 2019-01-30 MED ORDER — MIRTAZAPINE 15 MG PO TBDP
15.0000 mg | ORAL_TABLET | Freq: Every day | ORAL | Status: DC
  Administered 2019-01-30 – 2019-02-01 (×3): 15 mg via ORAL
  Filled 2019-01-30 (×4): qty 1

## 2019-01-30 MED ORDER — ONDANSETRON 4 MG PO TBDP: 4.0000 mg | ORAL_TABLET | Freq: Four times a day (QID) | ORAL | Status: DC | PRN

## 2019-01-30 MED ORDER — ALBUTEROL SULFATE HFA 108 (90 BASE) MCG/ACT IN AERS: 2.0000 | INHALATION_SPRAY | Freq: Four times a day (QID) | RESPIRATORY_TRACT | Status: DC | PRN

## 2019-01-30 MED ORDER — POTASSIUM CHLORIDE 10 MEQ/100ML IV SOLN
10.0000 meq | INTRAVENOUS | Status: AC
  Administered 2019-01-30 (×4): 10 meq via INTRAVENOUS
  Filled 2019-01-30 (×4): qty 100

## 2019-01-30 MED ORDER — METOPROLOL TARTRATE 25 MG PO TABS
12.5000 mg | ORAL_TABLET | Freq: Three times a day (TID) | ORAL | Status: DC
  Administered 2019-01-30 – 2019-02-02 (×8): 12.5 mg via ORAL
  Filled 2019-01-30 (×8): qty 1

## 2019-01-30 MED ORDER — PHENOL 1.4 % MT LIQD: 1.0000 | OROMUCOSAL | Status: DC | PRN

## 2019-01-30 MED ORDER — ACETAMINOPHEN 325 MG PO TABS
650.0000 mg | ORAL_TABLET | Freq: Four times a day (QID) | ORAL | Status: DC
  Administered 2019-01-30 – 2019-01-31 (×5): 650 mg via ORAL
  Filled 2019-01-30 (×6): qty 2

## 2019-01-30 MED ORDER — POTASSIUM CHLORIDE 10 MEQ/100ML IV SOLN: 10.0000 meq | INTRAVENOUS | Status: AC

## 2019-01-30 MED ORDER — HYDROMORPHONE HCL 1 MG/ML IJ SOLN
0.5000 mg | INTRAMUSCULAR | Status: DC | PRN
  Administered 2019-01-31 – 2019-02-01 (×2): 1 mg via INTRAVENOUS
  Filled 2019-01-30 (×2): qty 1

## 2019-01-30 MED ORDER — POTASSIUM PHOSPHATES 15 MMOLE/5ML IV SOLN
30.0000 mmol | Freq: Once | INTRAVENOUS | Status: AC
  Administered 2019-01-30: 12:00:00 30 mmol via INTRAVENOUS
  Filled 2019-01-30: qty 10

## 2019-01-30 MED ORDER — FUROSEMIDE 10 MG/ML IJ SOLN
20.0000 mg | Freq: Once | INTRAMUSCULAR | Status: AC
  Administered 2019-01-30: 20 mg via INTRAVENOUS
  Filled 2019-01-30: qty 2

## 2019-01-30 MED ORDER — MONTELUKAST SODIUM 10 MG PO TABS
10.0000 mg | ORAL_TABLET | Freq: Every day | ORAL | Status: DC
  Administered 2019-01-30 – 2019-02-01 (×3): 10 mg via ORAL
  Filled 2019-01-30 (×3): qty 1

## 2019-01-30 MED ORDER — AMLODIPINE BESYLATE 5 MG PO TABS
5.0000 mg | ORAL_TABLET | Freq: Every day | ORAL | Status: DC
  Administered 2019-01-30 – 2019-02-02 (×4): 5 mg via ORAL
  Filled 2019-01-30 (×4): qty 1

## 2019-01-30 MED ORDER — OXYCODONE HCL 5 MG PO TABS
5.0000 mg | ORAL_TABLET | ORAL | Status: DC | PRN
  Administered 2019-01-30 – 2019-02-01 (×3): 10 mg via ORAL
  Filled 2019-01-30 (×4): qty 2

## 2019-01-30 NOTE — Consult Note (Signed)
[facility] Nurse wound follow up  Patient receiving care in [place].  Evette Cristal, PA present for wound assessment during NPWT dressing removal. Wound type: surgical Measurement: na Wound bed: 100% pink Drainage (amount, consistency, odor)  serosanginous Periwound: intact Dressing procedure/placement/frequency: wound forming a large false bottom. NPWT discontinued, false bottom opened up.  Dressing treatment changed to:  Twice daily saline moistened gauze into the abdominal wound bed, cover with ABD pads, tape in place. Monitor the wound area(s) for worsening of condition such as: Signs/symptoms of infection,  Increase in size,  Development of or worsening of odor, Development of pain, or increased pain at the affected locations.  Notify the medical team if any of these develop.  Thank you for the consult.  Discussed plan of care with the patient and bedside nurse.  [facility] nurse will not follow at this time.  Please re-consult the [facility] team if needed.  Val Riles, RN, MSN, CWOCN, CNS-BC, pager [phone]

## 2019-01-30 NOTE — Progress Notes (Signed)
Mews score no acute change.
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
## 2019-01-31 LAB — BASIC METABOLIC PANEL
Anion gap: 9 (ref 5–15)
BUN: 9 mg/dL (ref 8–23)
CO2: 33 mmol/L — ABNORMAL HIGH (ref 22–32)
Calcium: 8.6 mg/dL — ABNORMAL LOW (ref 8.9–10.3)
Chloride: 95 mmol/L — ABNORMAL LOW (ref 98–111)
Creatinine, Ser: 0.81 mg/dL (ref 0.44–1.00)
GFR calc Af Amer: >60 mL/min (ref >60)
GFR calc non Af Amer: >60 mL/min (ref >60)
Glucose, Bld: 172 mg/dL — ABNORMAL HIGH (ref 70–99)
Potassium: 3.3 mmol/L — ABNORMAL LOW (ref 3.5–5.1)
Sodium: 137 mmol/L (ref 135–145)

## 2019-01-31 LAB — GLUCOSE, CAPILLARY
Glucose-Capillary: 140 mg/dL — ABNORMAL HIGH (ref 70–99)
Glucose-Capillary: 93 mg/dL (ref 70–99)
Glucose-Capillary: 166 mg/dL — ABNORMAL HIGH (ref 70–99)
Glucose-Capillary: 113 mg/dL — ABNORMAL HIGH (ref 70–99)

## 2019-01-31 LAB — CBC
HCT: 28 % — ABNORMAL LOW (ref 36.0–46.0)
Hemoglobin: 8.9 g/dL — ABNORMAL LOW (ref 12.0–15.0)
MCH: 30.1 pg (ref 26.0–34.0)
MCHC: 31.8 g/dL (ref 30.0–36.0)
MCV: 94.6 fL (ref 80.0–100.0)
Platelets: 394 10*3/uL (ref 150–400)
RBC: 2.96 MIL/uL — ABNORMAL LOW (ref 3.87–5.11)
RDW: 13.3 % (ref 11.5–15.5)
WBC: 19.4 10*3/uL — ABNORMAL HIGH (ref 4.0–10.5)
nRBC: 0.2 % (ref 0.0–0.2)

## 2019-01-31 MED ORDER — POTASSIUM CHLORIDE CRYS ER 20 MEQ PO TBCR
40.0000 meq | EXTENDED_RELEASE_TABLET | Freq: Two times a day (BID) | ORAL | Status: AC
  Administered 2019-01-31 (×2): 40 meq via ORAL
  Filled 2019-01-31 (×2): qty 2

## 2019-01-31 MED ORDER — WHITE PETROLATUM EX OINT
TOPICAL_OINTMENT | CUTANEOUS | Status: AC
  Filled 2019-01-31: qty 28.35
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
## 2019-01-31 NOTE — Plan of Care (Signed)

## 2019-02-01 LAB — BASIC METABOLIC PANEL
Anion gap: 9 (ref 5–15)
BUN: 8 mg/dL (ref 8–23)
CO2: 29 mmol/L (ref 22–32)
Calcium: 8.4 mg/dL — ABNORMAL LOW (ref 8.9–10.3)
Chloride: 101 mmol/L (ref 98–111)
Creatinine, Ser: 0.88 mg/dL (ref 0.44–1.00)
GFR calc Af Amer: >60 mL/min (ref >60)
GFR calc non Af Amer: >60 mL/min (ref >60)
Glucose, Bld: 162 mg/dL — ABNORMAL HIGH (ref 70–99)
Potassium: 3.4 mmol/L — ABNORMAL LOW (ref 3.5–5.1)
Sodium: 139 mmol/L (ref 135–145)

## 2019-02-01 LAB — CBC
HCT: 27.4 % — ABNORMAL LOW (ref 36.0–46.0)
Hemoglobin: 8.9 g/dL — ABNORMAL LOW (ref 12.0–15.0)
MCH: 29.9 pg (ref 26.0–34.0)
MCHC: 32.5 g/dL (ref 30.0–36.0)
MCV: 91.9 fL (ref 80.0–100.0)
Platelets: 439 10*3/uL — ABNORMAL HIGH (ref 150–400)
RBC: 2.98 MIL/uL — ABNORMAL LOW (ref 3.87–5.11)
RDW: 13.3 % (ref 11.5–15.5)
WBC: 20.9 10*3/uL — ABNORMAL HIGH (ref 4.0–10.5)
nRBC: 0.2 % (ref 0.0–0.2)

## 2019-02-01 LAB — GLUCOSE, CAPILLARY
Glucose-Capillary: 150 mg/dL — ABNORMAL HIGH (ref 70–99)
Glucose-Capillary: 199 mg/dL — ABNORMAL HIGH (ref 70–99)
Glucose-Capillary: 123 mg/dL — ABNORMAL HIGH (ref 70–99)
Glucose-Capillary: 132 mg/dL — ABNORMAL HIGH (ref 70–99)

## 2019-02-01 MED ORDER — POTASSIUM CHLORIDE CRYS ER 20 MEQ PO TBCR
40.0000 meq | EXTENDED_RELEASE_TABLET | Freq: Two times a day (BID) | ORAL | Status: AC
  Administered 2019-02-01 (×2): 40 meq via ORAL
  Filled 2019-02-01 (×2): qty 2

## 2019-02-01 NOTE — Progress Notes (Addendum)
Pt did not want her diet advanced to soft diet just yet. Still having some nausea when taking pills.

## 2019-02-01 NOTE — Progress Notes (Signed)
PT in severe pain at this time. Pain med given. BP was up to 170/63. Will recheck.
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
## 2019-02-02 LAB — GLUCOSE, CAPILLARY
Glucose-Capillary: 145 mg/dL — ABNORMAL HIGH (ref 70–99)
Glucose-Capillary: 200 mg/dL — ABNORMAL HIGH (ref 70–99)

## 2019-02-02 MED ORDER — OXYCODONE HCL 5 MG PO TABS: 5.0000 mg | ORAL_TABLET | Freq: Four times a day (QID) | ORAL | 0 refills | Status: DC | PRN

## 2019-02-02 MED ORDER — ACETAMINOPHEN 325 MG PO TABS: 650.0000 mg | ORAL_TABLET | Freq: Four times a day (QID) | ORAL | Status: DC

## 2019-02-02 MED FILL — oxyCODONE HCL 5 MG TABS: 5 | 7 days supply | Qty: 30 | Fill #0

## 2019-02-02 NOTE — Progress Notes (Signed)
Wound care instructions provided with teach back with daughter. Verbalized understanding and performed dressing change well. Feels confident about performing dressing changes.
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
## 2019-02-02 NOTE — Discharge Instructions (Signed)
[facility]      [facility] [phone]  OPEN ABDOMINAL SURGERY: POST OP INSTRUCTIONS  Always review your discharge instruction sheet given to you by the facility where your surgery was performed.  IF YOU HAVE DISABILITY OR FAMILY LEAVE FORMS, YOU MUST BRING THEM TO THE OFFICE FOR PROCESSING.  PLEASE DO NOT GIVE THEM TO YOUR DOCTOR.  1. A prescription for pain medication may be given to you upon discharge.  Take your pain medication as prescribed, if needed.  If narcotic pain medicine is not needed, then you may take acetaminophen (Tylenol) or ibuprofen (Advil) as needed. 2. Take your usually prescribed medications unless otherwise directed. 3. If you need a refill on your pain medication, please contact your pharmacy. They will contact our office to request authorization.  Prescriptions will not be filled after 5pm or on week-ends. 4. You should follow a light diet the first few days after arrival home, such as soup and crackers, pudding, etc.unless your doctor has advised otherwise. A high-fiber, low fat diet can be resumed as tolerated.   Be sure to include lots of fluids daily. Most patients will experience some swelling and bruising on the chest and neck area.  Ice packs will help.  Swelling and bruising can take several days to resolve 5. Most patients will experience some swelling and bruising in the area of the incision. Ice pack will help. Swelling and bruising can take several days to resolve..  6. It is common to experience some constipation if taking pain medication after surgery.  Increasing fluid intake and taking a stool softener will usually help or prevent this problem from occurring.  A mild laxative (Milk of Magnesia or Miralax) should be taken according to package directions if there are no bowel movements after 48 hours. 7.  You may have steri-strips (small skin tapes) in place directly over the incision.  These strips should be left on the skin for 7-10 days.  If your  surgeon used skin glue on the incision, you may shower in 24 hours.  The glue will flake off over the next 2-3 weeks.  Any sutures or staples will be removed at the office during your follow-up visit. You may find that a light gauze bandage over your incision may keep your staples from being rubbed or pulled. You may shower and replace the bandage daily. 8. ACTIVITIES:  You may resume regular (light) daily activities beginning the next day--such as daily self-care, walking, climbing stairs--gradually increasing activities as tolerated.  You may have sexual intercourse when it is comfortable.  Refrain from any heavy lifting or straining until approved by your doctor. a. You may drive when you no longer are taking prescription pain medication, you can comfortably wear a seatbelt, and you can safely maneuver your car and apply brakes b. Return to Work: ___________________________________ 9. You should see your doctor in the office for a follow-up appointment approximately two weeks after your surgery.  Make sure that you call for this appointment within a day or two after you arrive home to insure a convenient appointment time. OTHER INSTRUCTIONS:  _____________________________________________________________ _____________________________________________________________  WHEN TO CALL YOUR DOCTOR: 1. Fever over 101.0 2. Inability to urinate 3. Nausea and/or vomiting 4. Extreme swelling or bruising 5. Continued bleeding from incision. 6. Increased pain, redness, or drainage from the incision. 7. Difficulty swallowing or breathing 8. Muscle cramping or spasms. 9. Numbness or tingling in hands or feet or around lips.  The clinic staff is available to   answer your questions during regular business hours.  Please don't hesitate to call and ask to speak to one of the nurses if you have concerns.  For further questions, please visit [email or web address]   MIDLINE WOUND CARE: - midline  dressing to be changed twice daily - supplies: sterile saline, kerlix, scissors, ABD pads, tape  - remove dressing and all packing carefully, moistening with sterile saline as needed to avoid packing/internal dressing sticking to the wound. - clean edges of skin around the wound with water/gauze, making sure there is no tape debris or leakage left on skin that could cause skin irritation or breakdown. - dampen and clean kerlix with sterile saline and pack wound from wound base to skin level, making sure to take note of any possible areas of wound tracking, tunneling and packing appropriately. Wound can be packed loosely. Trim kerlix to size if a whole kerlix is not required. - cover wound with a dry ABD pad and secure with tape.  - write the date/time on the dry dressing/tape to better track when the last dressing change occurred. - apply any skin protectant/powder recommended by clinician to protect skin/skin folds. - change dressing as needed if leakage occurs, wound gets contaminated, or patient requests to shower. - patient may shower daily with wound open and following the shower the wound should be dried and a clean dressing placed.     How to Use a Soft Cervical Collar  For treatment of your Occipital Condyle Fracture  A soft cervical collar is a padded brace that wraps around your neck and is held in place with fabric strip fasteners. A soft cervical collar is a short-term treatment for neck pain. It supports your neck because your chin and some of the weight of your head rest on the collar. It also helps prevent your head from moving as much as it usually does, which may reduce pain. You may need to wear a soft cervical collar to limit neck motion and allow your neck muscles to rest. Your health care provider may recommend that you wear a soft cervical collar if you have:  Neck pain from stress or strain.  A mild neck injury.  A pinched nerve in your neck.  Pain after a whiplash  injury. What are the risks? Wearing a cervical collar is safe. However, problems may occur, such as skin irritation from the collar rubbing on the skin. How to use a soft cervical collar There are many types of soft cervical collars. Instructions may vary based on the manufacturer. Follow the instructions specific to your device. In general:  Wear the collar as told by your health care provider. Do not continue to wear the collar for longer than recommended by your health care provider.  When putting on the collar: ? Hold the collar out in front of you with one hand on each end. ? Place the front parts of the collar under your chin. There may be a slight dip in the front for your chin. ? Reach behind your head with both hands to fasten the collar in place. Most soft cervical collars fasten at the back. ? If you have trouble reaching behind your head, ask someone to help you strap the collar in place. The collar should feel snug but not tight.  Make sure the front of your chin is over the center of the collar in the front. You should feel as if your head is in a natural position, not stretched.  Loosen the collar  if it interferes with your breathing or makes your neck pain get worse. It should fit comfortably.  Ask your health care provider if you should: ? Wear the collar to bed. ? Take the collar off to bathe or shower. Do not let your collar get wet. If you must keep the collar on while bathing, cover it with a watertight covering when you take a bath or a shower. Follow these instructions at home:  Return to your normal activities as told by your health care provider. Ask your health care provider what activities are safe for you.  Do not lift anything that is heavier than 10 lb (4.5 kg), or the limit that you are told, until your health care provider says that it is safe.  If directed, put ice on the back of your neck to help manage pain: ? Remove the cervical collar, if your health  care provider approves. ? Put ice in a plastic bag. ? Place a towel between your skin and the bag. ? Leave the ice on for 20 minutes, 2-3 times a day.  Ask your health care provider when it is safe for you to drive.  Check your skin daily for redness or irritation.  To clean your collar: ? Close the fasteners and hand wash with mild soap and warm water. ? Rinse the collar and allow it to air-dry before putting it on. ? Do not put the collar in a washing machine or dryer. Do not use bleach or fabric softener.  Keep all follow-up visits as told by your health care provider. This is important. Contact a health care provider if:  Your neck pain gets worse at home.  You have new symptoms, such as numbness, tingling, or weakness.  You have skin irritation from the collar.  You are not getting better at home. Summary  A soft cervical collar is a padded brace that wraps around your neck. It can be used as a short-term treatment for neck pain from minor injuries or a pinched nerve.  The collar provides support and limits neck motion. This allows your neck muscles to rest.  Wear a soft cervical collar only as told by your health care provider.  Do not continue to wear the collar for longer than recommended by your health care provider. This information is not intended to replace advice given to you by your health care provider. Make sure you discuss any questions you have with your health care provider. Document Released: 10/02/2017 Document Revised: 10/02/2017 Document Reviewed: 10/02/2017 Elsevier Patient Education  2020 [place].   Transverse Process Fracture  Bones of the spine (vertebrae) have portions that extend off to either side of the spine. These portions of bone are called transverse processes. A transverse process fracture, which is also called a rotation spine fracture, is a break in a transverse process. What are the causes? This condition may be caused by:  A  fall from a great height.  A car accident.  A sports injury.  A gunshot wound.  A hard, direct hit to the back. This kind of fracture often results from a sudden and severe bending of the spine to one side. Depending on the cause of the fracture, one or more bones may be affected. What increases the risk? You are more likely to develop this condition if:  You have thinning and loss of density in the bones (osteoporosis).  You play a contact sport. What are the signs or symptoms? The main symptom of  this condition is back pain. The pain may:  Be felt on the side of the spine (flank) where the fracture is.  Get worse when you move or take a deep breath. How is this diagnosed? This condition may be diagnosed based on:  Your symptoms.  Your medical history.  A physical exam. You may also have other tests, including:  X-rays.  A CT scan.  MRI. How is this treated? Most transverse process fractures heal on their own with time and rest. Treatment may involve supportive care, such as:  Limiting activity.  Medicines, such as: ? Pain medicine. ? Muscle-relaxing medicine.  Physical therapy.  A neck or back brace. Follow these instructions at home: If you have a brace:  Wear the neck or back brace as told by your health care provider. Remove it only as told by your health care provider.  Keep the brace clean.  If the brace is not waterproof: ? Do not let it get wet. ? Cover it with a watertight covering when you take a bath or a shower. Managing pain, stiffness, and swelling   If directed, put ice on the injured area: ? If you have a removable brace, remove it as told by your health care provider. ? Put ice in a plastic bag. ? Place a towel between your skin and the bag. ? Leave the ice on for 20 minutes, 2-3 times a day. Medicines  Take over-the-counter and prescription medicines only as told by your health care provider.  Do not drive or use heavy  machinery while taking prescription pain medicine.  If you are taking prescription pain medicine, take actions to prevent or treat constipation. Your health care provider may recommend that you: ? Drink enough fluid to keep your urine pale yellow. ? Eat foods that are high in fiber, such as fresh fruits and vegetables, whole grains, and beans. ? Limit foods that are high in fat and processed sugars, such as fried or sweet foods. ? Take an over-the-counter or prescription medicine for constipation. Activity  Stay in bed (on bed rest) only as directed by your health care provider. ? Avoid being in bed for a long time without moving. Get up to take short walks every 1-2 hours. This is important to improve blood flow and breathing. Ask for help if you feel weak or unsteady.  Return to your normal activities when your health care provider says it is okay. Ask if there are any activities that you should not do.  Do physical therapy exercises as recommended by your health care provider. General instructions  Do not use any products that contain nicotine or tobacco, such as cigarettes and e-cigarettes. These can delay bone healing. If you need help quitting, ask your health care provider.  Keep all follow-up visits as told by your health care provider. This is important. Visits can help to prevent permanent injury, disability, and long-lasting (chronic) pain. Contact a health care provider if:  You have a fever.  You develop a cough that makes your pain worse.  Your pain medicine is not helping.  Your pain does not get better over time.  You cannot return to your normal activities as planned or expected. Get help right away if:  Your pain is very bad and it suddenly gets worse.  You are unable to move any body part (paralysis) that is below the level of your injury.  You have numbness, tingling, or weakness in any body part that is below the level  of your injury.  You cannot control  your bladder or bowels. Summary  A transverse process fracture is a break in the portion of the bone that extends to the side of the spine.  Most transverse process fractures heal on their own with time and rest.  You may also have supportive treatments such as a back brace, pain medicines, and physical therapy.  Keep all follow-up visits. This is important and will help to prevent permanent injury, disability, and long-lasting (chronic) pain. This information is not intended to replace advice given to you by your health care provider. Make sure you discuss any questions you have with your health care provider. Document Released: 05/16/2006 Document Revised: 03/13/2017 Document Reviewed: 03/13/2017 Elsevier Patient Education  2020 [place] were found to have a right pubic rami fracture. Orthopedics recommends weight bearing as tolerated. Please follow up with Orthopedics.

## 2019-12-14 ENCOUNTER — Encounter: Payer: Self-pay | Admitting: Radiology

## 2019-12-14 ENCOUNTER — Emergency Department: Payer: Medicare HMO

## 2019-12-14 ENCOUNTER — Observation Stay
Admission: EM | Admit: 2019-12-14 | Discharge: 2019-12-16 | Disposition: A | Discharge status: Home / Self Care | Payer: Medicare HMO | Attending: Internal Medicine | Admitting: Internal Medicine

## 2019-12-14 ENCOUNTER — Other Ambulatory Visit: Payer: Self-pay

## 2019-12-14 ENCOUNTER — Observation Stay: Payer: Medicare HMO

## 2019-12-14 DIAGNOSIS — I10 Essential (primary) hypertension: Secondary | ICD-10-CM | POA: Insufficient documentation

## 2019-12-14 DIAGNOSIS — Z853 Personal history of malignant neoplasm of breast: Secondary | ICD-10-CM | POA: Diagnosis not present

## 2019-12-14 DIAGNOSIS — E1165 Type 2 diabetes mellitus with hyperglycemia: Secondary | ICD-10-CM | POA: Insufficient documentation

## 2019-12-14 DIAGNOSIS — E872 Acidosis, unspecified: Secondary | ICD-10-CM

## 2019-12-14 DIAGNOSIS — R6510 Systemic inflammatory response syndrome (SIRS) of non-infectious origin without acute organ dysfunction: Secondary | ICD-10-CM | POA: Insufficient documentation

## 2019-12-14 DIAGNOSIS — R109 Unspecified abdominal pain: Secondary | ICD-10-CM | POA: Diagnosis not present

## 2019-12-14 DIAGNOSIS — J45909 Unspecified asthma, uncomplicated: Secondary | ICD-10-CM | POA: Diagnosis not present

## 2019-12-14 DIAGNOSIS — Z7982 Long term (current) use of aspirin: Secondary | ICD-10-CM | POA: Diagnosis not present

## 2019-12-14 DIAGNOSIS — R0989 Other specified symptoms and signs involving the circulatory and respiratory systems: Secondary | ICD-10-CM

## 2019-12-14 DIAGNOSIS — Z87891 Personal history of nicotine dependence: Secondary | ICD-10-CM | POA: Insufficient documentation

## 2019-12-14 DIAGNOSIS — E1169 Type 2 diabetes mellitus with other specified complication: Secondary | ICD-10-CM | POA: Diagnosis present

## 2019-12-14 DIAGNOSIS — E876 Hypokalemia: Secondary | ICD-10-CM | POA: Diagnosis present

## 2019-12-14 DIAGNOSIS — I152 Hypertension secondary to endocrine disorders: Secondary | ICD-10-CM | POA: Diagnosis present

## 2019-12-14 DIAGNOSIS — Z7984 Long term (current) use of oral hypoglycemic drugs: Secondary | ICD-10-CM | POA: Insufficient documentation

## 2019-12-14 DIAGNOSIS — Z79899 Other long term (current) drug therapy: Secondary | ICD-10-CM | POA: Diagnosis not present

## 2019-12-14 DIAGNOSIS — Z20822 Contact with and (suspected) exposure to covid-19: Secondary | ICD-10-CM | POA: Insufficient documentation

## 2019-12-14 DIAGNOSIS — R531 Weakness: Secondary | ICD-10-CM | POA: Diagnosis present

## 2019-12-14 DIAGNOSIS — R000 Tachycardia, unspecified: Secondary | ICD-10-CM | POA: Diagnosis not present

## 2019-12-14 DIAGNOSIS — E860 Dehydration: Secondary | ICD-10-CM

## 2019-12-14 DIAGNOSIS — D72829 Elevated white blood cell count, unspecified: Secondary | ICD-10-CM | POA: Insufficient documentation

## 2019-12-14 DIAGNOSIS — E119 Type 2 diabetes mellitus without complications: Secondary | ICD-10-CM

## 2019-12-14 DIAGNOSIS — E1159 Type 2 diabetes mellitus with other circulatory complications: Secondary | ICD-10-CM | POA: Diagnosis present

## 2019-12-14 DIAGNOSIS — Z7951 Long term (current) use of inhaled steroids: Secondary | ICD-10-CM | POA: Insufficient documentation

## 2019-12-14 DIAGNOSIS — R739 Hyperglycemia, unspecified: Secondary | ICD-10-CM

## 2019-12-14 DIAGNOSIS — E785 Hyperlipidemia, unspecified: Secondary | ICD-10-CM | POA: Diagnosis present

## 2019-12-14 DIAGNOSIS — Z794 Long term (current) use of insulin: Secondary | ICD-10-CM | POA: Insufficient documentation

## 2019-12-14 DIAGNOSIS — J4540 Moderate persistent asthma, uncomplicated: Secondary | ICD-10-CM | POA: Diagnosis present

## 2019-12-14 LAB — URINALYSIS, COMPLETE (UACMP) WITH MICROSCOPIC
Bacteria, UA: NONE SEEN
Bilirubin Urine: NEGATIVE
Glucose, UA: NEGATIVE mg/dL
Ketones, ur: NEGATIVE mg/dL
Leukocytes,Ua: NEGATIVE
Nitrite: NEGATIVE
Protein, ur: >300 mg/dL — AB
Specific Gravity, Urine: 1.025 (ref 1.005–1.030)
pH: 6 (ref 5.0–8.0)

## 2019-12-14 LAB — LACTIC ACID, PLASMA
Lactic Acid, Venous: 2.3 mmol/L (ref 0.5–1.9)
Lactic Acid, Venous: 2.7 mmol/L (ref 0.5–1.9)
Lactic Acid, Venous: 2.6 mmol/L (ref 0.5–1.9)

## 2019-12-14 LAB — HEPATIC FUNCTION PANEL
ALT: 15 U/L (ref 0–44)
AST: 15 U/L (ref 15–41)
Albumin: 4 g/dL (ref 3.5–5.0)
Alkaline Phosphatase: 88 U/L (ref 38–126)
Bilirubin, Direct: 0.1 mg/dL (ref 0.0–0.2)
Indirect Bilirubin: 0.9 mg/dL (ref 0.3–0.9)
Total Bilirubin: 1 mg/dL (ref 0.3–1.2)
Total Protein: 9.1 g/dL — ABNORMAL HIGH (ref 6.5–8.1)

## 2019-12-14 LAB — URINE DRUG SCREEN, QUALITATIVE (ARMC ONLY)
Amphetamines, Ur Screen: NOT DETECTED
Barbiturates, Ur Screen: NOT DETECTED
Benzodiazepine, Ur Scrn: NOT DETECTED
Cannabinoid 50 Ng, Ur ~~LOC~~: NOT DETECTED
Cocaine Metabolite,Ur ~~LOC~~: NOT DETECTED
MDMA (Ecstasy)Ur Screen: NOT DETECTED
Methadone Scn, Ur: NOT DETECTED
Opiate, Ur Screen: NOT DETECTED
Phencyclidine (PCP) Ur S: NOT DETECTED
Tricyclic, Ur Screen: NOT DETECTED

## 2019-12-14 LAB — BASIC METABOLIC PANEL
Anion gap: 13 (ref 5–15)
BUN: 15 mg/dL (ref 8–23)
CO2: 27 mmol/L (ref 22–32)
Calcium: 9.4 mg/dL (ref 8.9–10.3)
Chloride: 94 mmol/L — ABNORMAL LOW (ref 98–111)
Creatinine, Ser: 1.02 mg/dL — ABNORMAL HIGH (ref 0.44–1.00)
GFR, Estimated: >60 mL/min (ref >60)
Glucose, Bld: 257 mg/dL — ABNORMAL HIGH (ref 70–99)
Potassium: 3.4 mmol/L — ABNORMAL LOW (ref 3.5–5.1)
Sodium: 134 mmol/L — ABNORMAL LOW (ref 135–145)

## 2019-12-14 LAB — CBC
HCT: 41.3 % (ref 36.0–46.0)
Hemoglobin: 13.7 g/dL (ref 12.0–15.0)
MCH: 29.9 pg (ref 26.0–34.0)
MCHC: 33.2 g/dL (ref 30.0–36.0)
MCV: 90.2 fL (ref 80.0–100.0)
Platelets: 396 10*3/uL (ref 150–400)
RBC: 4.58 MIL/uL (ref 3.87–5.11)
RDW: 13 % (ref 11.5–15.5)
WBC: 23.3 10*3/uL — ABNORMAL HIGH (ref 4.0–10.5)
nRBC: 0 % (ref 0.0–0.2)

## 2019-12-14 LAB — CBG MONITORING, ED: Glucose-Capillary: 169 mg/dL — ABNORMAL HIGH (ref 70–99)

## 2019-12-14 LAB — PROTIME-INR
INR: 1.2 (ref 0.8–1.2)
Prothrombin Time: 14.8 seconds (ref 11.4–15.2)

## 2019-12-14 LAB — MAGNESIUM: Magnesium: 1.4 mg/dL — ABNORMAL LOW (ref 1.7–2.4)

## 2019-12-14 LAB — TSH: TSH: 1.887 u[IU]/mL (ref 0.350–4.500)

## 2019-12-14 LAB — TROPONIN I (HIGH SENSITIVITY)
Troponin I (High Sensitivity): 13 ng/L (ref <18)
Troponin I (High Sensitivity): 14 ng/L (ref <18)

## 2019-12-14 LAB — FIBRIN DERIVATIVES D-DIMER (ARMC ONLY): Fibrin derivatives D-dimer (ARMC): 2927.72 ng/mL (FEU) — ABNORMAL HIGH (ref 0.00–499.00)

## 2019-12-14 LAB — LIPASE, BLOOD: Lipase: 21 U/L (ref 11–51)

## 2019-12-14 LAB — RESPIRATORY PANEL BY RT PCR (FLU A&B, COVID)
Influenza A by PCR: NEGATIVE
Influenza B by PCR: NEGATIVE
SARS Coronavirus 2 by RT PCR: NEGATIVE

## 2019-12-14 LAB — CK: Total CK: 67 U/L (ref 38–234)

## 2019-12-14 LAB — APTT: aPTT: 36 seconds (ref 24–36)

## 2019-12-14 LAB — HEMOGLOBIN A1C
Hgb A1c MFr Bld: 6.6 % — ABNORMAL HIGH (ref 4.8–5.6)
Mean Plasma Glucose: 142.72 mg/dL

## 2019-12-14 LAB — PROCALCITONIN: Procalcitonin: <0.1 ng/mL

## 2019-12-14 MED ORDER — ACETAMINOPHEN 160 MG/5ML PO SOLN
650.0000 mg | Freq: Once | ORAL | Status: AC
  Administered 2019-12-14: 650 mg via ORAL
  Filled 2019-12-14 (×2): qty 20.3

## 2019-12-14 MED ORDER — POTASSIUM CHLORIDE 20 MEQ/15ML (10%) PO SOLN
40.0000 meq | Freq: Once | ORAL | Status: AC
  Administered 2019-12-14: 40 meq via ORAL
  Filled 2019-12-14: qty 30

## 2019-12-14 MED ORDER — AMLODIPINE BESYLATE 10 MG PO TABS
10.0000 mg | ORAL_TABLET | Freq: Every day | ORAL | Status: DC
  Administered 2019-12-14 – 2019-12-16 (×3): 10 mg via ORAL
  Filled 2019-12-14 (×3): qty 1

## 2019-12-14 MED ORDER — SODIUM CHLORIDE 0.9 % IV BOLUS
1000.0000 mL | Freq: Once | INTRAVENOUS | Status: AC
  Administered 2019-12-14: 1000 mL via INTRAVENOUS

## 2019-12-14 MED ORDER — ENALAPRIL MALEATE 20 MG PO TABS
20.0000 mg | ORAL_TABLET | Freq: Two times a day (BID) | ORAL | Status: DC
  Administered 2019-12-14 – 2019-12-16 (×4): 20 mg via ORAL
  Filled 2019-12-14 (×5): qty 1

## 2019-12-14 MED ORDER — SODIUM CHLORIDE 0.9 % IV SOLN
500.0000 mg | Freq: Once | INTRAVENOUS | Status: AC
  Administered 2019-12-14: 500 mg via INTRAVENOUS
  Filled 2019-12-14: qty 500

## 2019-12-14 MED ORDER — ALBUTEROL SULFATE HFA 108 (90 BASE) MCG/ACT IN AERS
2.0000 | INHALATION_SPRAY | Freq: Four times a day (QID) | RESPIRATORY_TRACT | Status: DC | PRN
  Administered 2019-12-15: 2 via RESPIRATORY_TRACT
  Filled 2019-12-14 (×2): qty 6.7

## 2019-12-14 MED ORDER — LACTATED RINGERS IV SOLN: INTRAVENOUS | Status: DC

## 2019-12-14 MED ORDER — LACTATED RINGERS IV BOLUS: 1000.0000 mL | Freq: Once | INTRAVENOUS | Status: DC

## 2019-12-14 MED ORDER — LACTATED RINGERS IV BOLUS
500.0000 mL | Freq: Once | INTRAVENOUS | Status: AC
  Administered 2019-12-14: 500 mL via INTRAVENOUS

## 2019-12-14 MED ORDER — POTASSIUM CHLORIDE CRYS ER 20 MEQ PO TBCR
40.0000 meq | EXTENDED_RELEASE_TABLET | Freq: Once | ORAL | Status: AC
  Administered 2019-12-14: 40 meq via ORAL
  Filled 2019-12-14: qty 2

## 2019-12-14 MED ORDER — ONDANSETRON HCL 4 MG/2ML IJ SOLN: 4.0000 mg | Freq: Four times a day (QID) | INTRAMUSCULAR | Status: DC | PRN

## 2019-12-14 MED ORDER — MAGNESIUM SULFATE 2 GM/50ML IV SOLN
2.0000 g | Freq: Once | INTRAVENOUS | Status: AC
  Administered 2019-12-14: 2 g via INTRAVENOUS
  Filled 2019-12-14: qty 50

## 2019-12-14 MED ORDER — ACETAMINOPHEN 325 MG PO TABS
650.0000 mg | ORAL_TABLET | Freq: Four times a day (QID) | ORAL | Status: DC | PRN
  Administered 2019-12-15 – 2019-12-16 (×3): 650 mg via ORAL
  Filled 2019-12-14 (×4): qty 2

## 2019-12-14 MED ORDER — IOHEXOL 350 MG/ML SOLN
100.0000 mL | Freq: Once | INTRAVENOUS | Status: AC | PRN
  Administered 2019-12-14: 100 mL via INTRAVENOUS

## 2019-12-14 MED ORDER — ONDANSETRON HCL 4 MG PO TABS: 4.0000 mg | ORAL_TABLET | Freq: Four times a day (QID) | ORAL | Status: DC | PRN

## 2019-12-14 MED ORDER — MONTELUKAST SODIUM 10 MG PO TABS
10.0000 mg | ORAL_TABLET | Freq: Every day | ORAL | Status: DC
  Administered 2019-12-14 – 2019-12-15 (×2): 10 mg via ORAL
  Filled 2019-12-14 (×4): qty 1

## 2019-12-14 MED ORDER — INSULIN ASPART 100 UNIT/ML ~~LOC~~ SOLN
0.0000 [IU] | SUBCUTANEOUS | Status: DC
  Administered 2019-12-14: 3 [IU] via SUBCUTANEOUS
  Filled 2019-12-14: qty 1

## 2019-12-14 MED ORDER — SODIUM CHLORIDE 0.9 % IV SOLN: INTRAVENOUS | Status: AC

## 2019-12-14 MED ORDER — ACETAMINOPHEN 650 MG RE SUPP: 650.0000 mg | Freq: Four times a day (QID) | RECTAL | Status: DC | PRN

## 2019-12-14 MED ORDER — ENOXAPARIN SODIUM 40 MG/0.4ML ~~LOC~~ SOLN
40.0000 mg | SUBCUTANEOUS | Status: DC
  Administered 2019-12-14 – 2019-12-15 (×2): 40 mg via SUBCUTANEOUS
  Filled 2019-12-14 (×3): qty 0.4

## 2019-12-14 MED ORDER — MOMETASONE FURO-FORMOTEROL FUM 200-5 MCG/ACT IN AERO
2.0000 | INHALATION_SPRAY | Freq: Two times a day (BID) | RESPIRATORY_TRACT | Status: DC
  Filled 2019-12-14: qty 8.8

## 2019-12-14 MED ORDER — INSULIN ASPART 100 UNIT/ML ~~LOC~~ SOLN
0.0000 [IU] | Freq: Three times a day (TID) | SUBCUTANEOUS | Status: DC
  Administered 2019-12-15: 1 [IU] via SUBCUTANEOUS
  Administered 2019-12-15: 2 [IU] via SUBCUTANEOUS
  Administered 2019-12-16: 1 [IU] via SUBCUTANEOUS
  Administered 2019-12-16: 3 [IU] via SUBCUTANEOUS
  Filled 2019-12-14 (×4): qty 1

## 2019-12-14 MED ORDER — CLONIDINE HCL 0.1 MG PO TABS
0.1000 mg | ORAL_TABLET | Freq: Two times a day (BID) | ORAL | Status: DC
  Administered 2019-12-14 – 2019-12-16 (×4): 0.1 mg via ORAL
  Filled 2019-12-14 (×4): qty 1

## 2019-12-14 MED ORDER — HYDROXYZINE HCL 25 MG PO TABS: 25.0000 mg | ORAL_TABLET | Freq: Three times a day (TID) | ORAL | Status: DC | PRN

## 2019-12-14 MED ORDER — SODIUM CHLORIDE 0.9 % IV SOLN
1.0000 g | Freq: Once | INTRAVENOUS | Status: AC
  Administered 2019-12-14: 1 g via INTRAVENOUS
  Filled 2019-12-14: qty 10

## 2019-12-14 MED ORDER — FLUTICASONE FUROATE-VILANTEROL 200-25 MCG/INH IN AEPB
1.0000 | INHALATION_SPRAY | Freq: Every day | RESPIRATORY_TRACT | Status: DC
  Administered 2019-12-15 – 2019-12-16 (×2): 1 via RESPIRATORY_TRACT
  Filled 2019-12-14 (×2): qty 28

## 2019-12-14 NOTE — Progress Notes (Signed)
Asked provider to order repeat lactate @ 1940, 2 hours after code sepsis called

## 2019-12-14 NOTE — ED Notes (Signed)
Report called to Remo Lipps, nurse accepting pt into room 219.  Patient belongings placed on bed with her.  Pt with VSS, watching tv prior to transfer to room.  IVF's infusing without difficulty.

## 2019-12-14 NOTE — Progress Notes (Signed)
CODE SEPSIS - PHARMACY COMMUNICATION  **Broad Spectrum Antibiotics should be administered within 1 hour of Sepsis diagnosis**  Time Code Sepsis Called/Page Received: 1746  Antibiotics Ordered: ceftriaxone/azithromycin  Time of 1st antibiotic administration: 1838  Additional action taken by pharmacy: NA  If necessary, Name of Provider/Nurse Contacted: RN already contacted by RN following Code Sepsis    Tawnya Crook ,PharmD Clinical Pharmacist  12/14/2019  7:01 PM

## 2019-12-14 NOTE — Progress Notes (Signed)
Following for code sepsis 

## 2019-12-14 NOTE — Progress Notes (Signed)
Notified bedside nurse of need to draw repeat lactic acid @ 1940.

## 2019-12-14 NOTE — H&P (Addendum)
History and Physical    Gail Stevenson KDT:[id number] DOB: 1955-08-04 DOA: 12/14/2019  PCP: Jonelle Sports, MD  Patient coming from: Home  I have personally briefly reviewed patient's old medical records in [place]  Chief Complaint: Generalized pain and weakness  HPI: Gail Stevenson is a 64 y.o. female with medical history significant for insulin-dependent type 2 diabetes, moderate persistent asthma, HTN, HLD, chronic leukocytosis/neutrophilia, breast cancer s/p right mastectomy, esophageal stenosis and grade 1 esophageal varices seen on upper endoscopy 02/25/2018, and traumatic injury resulting in small bowel mesenteric injuries with hemorrhage and small bowel perforation s/p ex lap and extended ileocecectomy 01/26/2019 who presents to the ED for evaluation of generalized pain and weakness.  Patient states about 4 days ago she began to have generalized pain most notably in her shoulders, mid central chest, abdomen, lower back, and bilateral lower extremities.  Pain was greater in the right leg than the left.  She has had some generalized weakness associated with this.  She says she has been having burning sensation with urination.  She reports subjective fevers at home but has not been able to check her temperature.  She has had an episode of chills.    She reports an episode of nausea and vomiting 2 days ago and has had decreased oral intake since then but states that she has been able to eat some small foods including pigs feet.  She says she had loose stools 5 days ago but more recently semisofter formed stools.  She says this is ongoing since her abdominal surgery in December 2020.  She says she has noticed some swelling in both ankles.  She reports pain behind her right knee.  She says she had a fall 2 weeks ago hitting her left knee but no significant injury.  She reports lightheadedness and dizziness once in a while.  She says she had some shortness of breath 2 nights ago  which she felt was related to her asthma.  She says she has an intermittent cough which is occasionally productive of white sputum.  She was diagnosed with a UTI 3.5 weeks ago and treated with a 10-day course of Keflex.  She says the only other medication was that she was restarted on Metformin almost 2 weeks ago.  She is a former smoker and reports a history of heavy alcohol abuse.  She says she quit both at age 89.  She denies any illicit drug use.  She lives at home with 4 other family members.  ED Course:  Initial vitals showed BP 183/88, pulse 113, RR 19, temp 98.6 Fahrenheit, SPO2 98% on room air.  Labs show WBC 23.3, hemoglobin 13.7, platelets 396,000, lactic acid 2.3 > 2.7, high-sensitivity troponin I 13 > 14, magnesium 1.4, LFTs within normal limits, CK 67.  Urinalysis showed negative nitrites, negative leukocytes, 0-5 WBC/hpf, 11-20 RBC/hpf, no bacteria microscopy.  Urine culture was ordered and pending.  Blood cultures were obtained and pending.  2 view chest x-ray was negative for focal consolidation, edema, or effusion.  CTA chest PE study and CT abdomen/pelvis with contrast were obtained and were negative for evidence of pulmonary emboli or acute intra-abdominal or pelvic abnormality.  Changes of prior right mastectomy and ileocecectomy were seen.  Patient was given empiric IV ceftriaxone and azithromycin, 2 g IV magnesium, 40 mEq oral potassium, 1.5 L LR.  The hospitalist service was consulted to admit for further evaluation and management.  Review of Systems: All systems reviewed and are negative except  as documented in history of present illness above.   Past Medical History:  Diagnosis Date  . Asthma   . Cancer [facility]) 2003   right side total mastectomy  . Diabetes mellitus without complication ([place])   . Diastolic dysfunction   . Diverticulitis   . Hemorrhoids   . Hepatitis C   . Hypertension   . Trigeminal neuralgia     Past Surgical History:  Procedure  Laterality Date  . BREAST SURGERY Right 2003   total mastectomy  . LAPAROTOMY N/A 01/25/2019   Procedure: EXPLORATORY LAPAROTOMY, ILEOCECECTOMY;  Surgeon: Georganna Skeans, MD;  Location: [facility];  Service: General;  Laterality: N/A;  . MASTECTOMY Right 2003   total    Social History:  reports that she quit smoking about 27 years ago. She has never used smokeless tobacco. She reports that she does not drink alcohol and does not use drugs.  Allergies  Allergen Reactions  . Patanol [Olopatadine]   . Paroxetine Hcl Rash    Family History  Problem Relation Age of Onset  . Gastric cancer Father   . Breast cancer Maternal Aunt      Prior to Admission medications   Medication Sig Start Date End Date Taking? Authorizing Provider  acetaminophen (TYLENOL) 325 MG tablet Take 2 tablets (650 mg total) by mouth every 6 (six) hours. 02/02/19   Meuth, Brooke A, PA-C  ADVAIR DISKUS 500-50 MCG/DOSE AEPB Inhale 2 puffs into the lungs 2 (two) times daily.  10/06/18   [provider]  amLODipine (NORVASC) 5 MG tablet Take 5 mg by mouth daily. 12/01/18   [provider]  aspirin EC 81 MG tablet Take 81 mg by mouth daily.    [provider]  atorvastatin (LIPITOR) 40 MG tablet Take 40 mg by mouth daily. 12/09/18   [provider]  beclomethasone (QVAR) 80 MCG/ACT inhaler Inhale 2 puffs into the lungs 4 (four) times daily as needed (wheezing).     [provider]  BYDUREON 2 MG PEN Inject 2 mg as directed once a week. 04/04/15   [provider]  cloNIDine (CATAPRES) 0.1 MG tablet Take 0.1 mg by mouth 2 (two) times daily.  03/13/15   [provider]  enalapril (VASOTEC) 10 MG tablet Take 20 mg by mouth 2 (two) times daily.  10/06/18   [provider]  glipiZIDE (GLUCOTROL) 10 MG tablet Take 10 mg by mouth 2 (two) times daily before a meal.    [provider]  hydrochlorothiazide (HYDRODIURIL) 25 MG tablet Take 25 mg by mouth daily.     [provider]  hydrOXYzine (ATARAX/VISTARIL) 25 MG tablet Take 1 tablet (25 mg total) by mouth every 8 (eight) hours as needed for anxiety (or sleep). 11/09/18   Duffy Bruce, MD  LANTUS SOLOSTAR 100 UNIT/ML Solostar Pen Inject 56 Units into the skin at bedtime.  10/07/18   [provider]  metFORMIN (GLUCOPHAGE) 500 MG tablet Take 1,000-1,500 tablets by mouth 2 (two) times daily.  03/13/15   [provider]  mirtazapine (REMERON SOL-TAB) 15 MG disintegrating tablet Take 15 mg by mouth at bedtime.  09/30/18   [provider]  montelukast (SINGULAIR) 10 MG tablet Take 10 mg by mouth at bedtime. 12/26/18   [provider]  oxyCODONE (OXY IR/ROXICODONE) 5 MG immediate release tablet Take 1 tablet (5 mg total) by mouth every 6 (six) hours as needed for severe pain. 02/02/19   Meuth, Brooke A, PA-C  potassium chloride (K-DUR) 10  MEQ tablet Take 1 tablet (10 mEq total) by mouth daily. 07/18/14   Johnn Hai, PA-C  PROAIR HFA 108 [phone] Base) MCG/ACT inhaler Inhale 2 puffs into the lungs 4 (four) times daily as needed. 03/13/15   [provider]  sertraline (ZOLOFT) 20 MG/ML concentrated solution Take 50 mg by mouth daily. 11/30/18   [provider]    Physical Exam: Vitals:   12/14/19 1115 12/14/19 1125 12/14/19 1630 12/14/19 1808  BP:  (!) 170/69 (!) 183/88 (!) 180/87  Pulse:  (!) 107 (!) 113 (!) 105  Resp:  20 19 20   Temp:  98.3 F (36.8 C)  98.6 F (37 C)  TempSrc:  Oral  Oral  SpO2:  98% 100% 99%  Weight: 77.1 kg     Height: 5\' 2"  (1.575 m)      Constitutional: Resting supine in bed, NAD, calm, comfortable Eyes: PERRL, lids and conjunctivae normal ENMT: Mucous membranes are moist. Posterior pharynx clear of any exudate or lesions.Normal dentition.  Neck: normal, supple, no masses. Respiratory: clear to auscultation bilaterally, no wheezing, no crackles. Normal respiratory effort. No accessory muscle use.  Cardiovascular:  Tachycardic with regular rhythm, no murmurs / rubs / gallops. No extremity edema. 2+ pedal pulses. Abdomen: Mild generalized tenderness, no masses palpated. No hepatosplenomegaly. Bowel sounds positive.  Musculoskeletal: no clubbing / cyanosis. No joint deformity upper and lower extremities. Good ROM, no contractures. Normal muscle tone.  Skin: no rashes, lesions, ulcers. No induration Neurologic: CN 2-12 grossly intact. Sensation intact, Strength 5/5 in all 4.  Psychiatric: Alert and oriented x 3. Normal mood.   Labs on Admission: I have personally reviewed following labs and imaging studies  CBC: Recent Labs  Lab 12/14/19 1119  WBC 23.3*  HGB 13.7  HCT 41.3  MCV 90.2  PLT 272   Basic Metabolic Panel: Recent Labs  Lab 12/14/19 1119 12/14/19 1722  NA 134*  --   K 3.4*  --   CL 94*  --   CO2 27  --   GLUCOSE 257*  --   BUN 15  --   CREATININE 1.02*  --   CALCIUM 9.4  --   MG  --  1.4*   GFR: Estimated Creatinine Clearance: 52.9 mL/min (A) (by C-G formula based on SCr of 1.02 mg/dL (H)). Liver Function Tests: Recent Labs  Lab 12/14/19 1722  AST 15  ALT 15  ALKPHOS 88  BILITOT 1.0  PROT 9.1*  ALBUMIN 4.0   No results for input(s): LIPASE, AMYLASE in the last 168 hours. No results for input(s): AMMONIA in the last 168 hours. Coagulation Profile: Recent Labs  Lab 12/14/19 1558  INR 1.2   Cardiac Enzymes: Recent Labs  Lab 12/14/19 1741  CKTOTAL 67   BNP (last 3 results) No results for input(s): PROBNP in the last 8760 hours. HbA1C: Recent Labs    12/14/19 1722  HGBA1C 6.6*   CBG: Recent Labs  Lab 12/14/19 1755  GLUCAP 169*   Lipid Profile: No results for input(s): CHOL, HDL, LDLCALC, TRIG, CHOLHDL, LDLDIRECT in the last 72 hours. Thyroid Function Tests: No results for input(s): TSH, T4TOTAL, FREET4, T3FREE, THYROIDAB in the last 72 hours. Anemia Panel: No results for input(s): VITAMINB12, FOLATE, FERRITIN, TIBC, IRON, RETICCTPCT in the last 72  hours. Urine analysis:    Component Value Date/Time   COLORURINE YELLOW 12/14/2019 1119   APPEARANCEUR CLEAR 12/14/2019 1119   LABSPEC 1.025 12/14/2019 1119   PHURINE 6.0 12/14/2019 1119   GLUCOSEU  NEGATIVE 12/14/2019 1119   HGBUR MODERATE (A) 12/14/2019 1119   BILIRUBINUR NEGATIVE 12/14/2019 1119   [place] 12/14/2019 1119   PROTEINUR >300 (A) 12/14/2019 1119   NITRITE NEGATIVE 12/14/2019 1119   LEUKOCYTESUR NEGATIVE 12/14/2019 1119    Radiological Exams on Admission: DG Chest 2 View  Result Date: 12/14/2019 CLINICAL DATA:  Weakness, tachycardia. EXAM: CHEST - 2 VIEW COMPARISON:  01/28/2019. FINDINGS: The heart size and mediastinal contours are within normal limits. No consolidation. No visible pleural effusions or pneumothorax. The visualized skeletal structures are unremarkable. Clips project in the right axilla. IMPRESSION: No acute cardiopulmonary disease. Electronically Signed   By: Margaretha Sheffield MD   On: 12/14/2019 14:28   CT Angio Chest PE W and/or Wo Contrast  Result Date: 12/14/2019 CLINICAL DATA:  Generalized pain and weakness, initial encounter EXAM: CT ANGIOGRAPHY CHEST CT ABDOMEN AND PELVIS WITH CONTRAST TECHNIQUE: Multidetector CT imaging of the chest was performed using the standard protocol during bolus administration of intravenous contrast. Multiplanar CT image reconstructions and MIPs were obtained to evaluate the vascular anatomy. Multidetector CT imaging of the abdomen and pelvis was performed using the standard protocol during bolus administration of intravenous contrast. CONTRAST:  119mL OMNIPAQUE IOHEXOL 350 MG/ML SOLN COMPARISON:  01/25/2019 FINDINGS: CTA CHEST FINDINGS Cardiovascular: Thoracic aorta demonstrates a normal branching pattern. Atherosclerotic calcifications are noted. No aneurysmal dilatation or dissection is seen. No cardiac enlargement is seen. The pulmonary artery shows a normal branching pattern. The peripheral branches are somewhat  limited due to timing of the contrast bolus although no large central embolus is seen. Mediastinum/Nodes: Thoracic inlet is within normal limits. No sizable hilar or mediastinal adenopathy is noted. The esophagus as visualized is within normal limits. Lungs/Pleura: Lungs are well aerated bilaterally. No focal infiltrate, effusion or pneumothorax is seen. No focal contusion is noted. Musculoskeletal: Changes of right mastectomy. No acute bony abnormality is noted. Review of the MIP images confirms the above findings. CT ABDOMEN and PELVIS FINDINGS Hepatobiliary: No focal liver abnormality is seen. No gallstones, gallbladder wall thickening, or biliary dilatation. Pancreas: Unremarkable. No pancreatic ductal dilatation or surrounding inflammatory changes. Spleen: Normal in size without focal abnormality. Adrenals/Urinary Tract: Adrenal glands are within normal limits. Kidneys are well visualized bilaterally. Normal enhancement pattern is noted. No renal calculi or obstructive changes are seen. Bladder is partially distended. Delayed images demonstrate normal excretion of contrast. Stomach/Bowel: Scattered diverticular change of the colon is noted. No obstructive or inflammatory changes of the colon are seen. Changes consistent with the known history of ileocecectomy are seen. The remainder of the small bowel and stomach are within normal limits. Vascular/Lymphatic: Aortic atherosclerosis. No enlarged abdominal or pelvic lymph nodes. Reproductive: Status post hysterectomy. No adnexal masses. Other: No abdominal wall hernia or abnormality. No abdominopelvic ascites. Musculoskeletal: Degenerative changes of lumbar spine are noted. Previously seen right pubic rami fractures have healed in the interval. Review of the MIP images confirms the above findings. IMPRESSION: CT of the chest: No definitive pulmonary emboli are identified. Changes of prior right mastectomy. CT of the abdomen and pelvis: Postoperative changes at  ileocecal junction consistent with the given clinical history. No acute abnormality is noted. Electronically Signed   By: Inez Catalina M.D.   On: 12/14/2019 18:34   CT ABDOMEN PELVIS W CONTRAST  Result Date: 12/14/2019 CLINICAL DATA:  Generalized pain and weakness, initial encounter EXAM: CT ANGIOGRAPHY CHEST CT ABDOMEN AND PELVIS WITH CONTRAST TECHNIQUE: Multidetector CT imaging of the chest was performed using the standard protocol  during bolus administration of intravenous contrast. Multiplanar CT image reconstructions and MIPs were obtained to evaluate the vascular anatomy. Multidetector CT imaging of the abdomen and pelvis was performed using the standard protocol during bolus administration of intravenous contrast. CONTRAST:  153mL OMNIPAQUE IOHEXOL 350 MG/ML SOLN COMPARISON:  01/25/2019 FINDINGS: CTA CHEST FINDINGS Cardiovascular: Thoracic aorta demonstrates a normal branching pattern. Atherosclerotic calcifications are noted. No aneurysmal dilatation or dissection is seen. No cardiac enlargement is seen. The pulmonary artery shows a normal branching pattern. The peripheral branches are somewhat limited due to timing of the contrast bolus although no large central embolus is seen. Mediastinum/Nodes: Thoracic inlet is within normal limits. No sizable hilar or mediastinal adenopathy is noted. The esophagus as visualized is within normal limits. Lungs/Pleura: Lungs are well aerated bilaterally. No focal infiltrate, effusion or pneumothorax is seen. No focal contusion is noted. Musculoskeletal: Changes of right mastectomy. No acute bony abnormality is noted. Review of the MIP images confirms the above findings. CT ABDOMEN and PELVIS FINDINGS Hepatobiliary: No focal liver abnormality is seen. No gallstones, gallbladder wall thickening, or biliary dilatation. Pancreas: Unremarkable. No pancreatic ductal dilatation or surrounding inflammatory changes. Spleen: Normal in size without focal abnormality.  Adrenals/Urinary Tract: Adrenal glands are within normal limits. Kidneys are well visualized bilaterally. Normal enhancement pattern is noted. No renal calculi or obstructive changes are seen. Bladder is partially distended. Delayed images demonstrate normal excretion of contrast. Stomach/Bowel: Scattered diverticular change of the colon is noted. No obstructive or inflammatory changes of the colon are seen. Changes consistent with the known history of ileocecectomy are seen. The remainder of the small bowel and stomach are within normal limits. Vascular/Lymphatic: Aortic atherosclerosis. No enlarged abdominal or pelvic lymph nodes. Reproductive: Status post hysterectomy. No adnexal masses. Other: No abdominal wall hernia or abnormality. No abdominopelvic ascites. Musculoskeletal: Degenerative changes of lumbar spine are noted. Previously seen right pubic rami fractures have healed in the interval. Review of the MIP images confirms the above findings. IMPRESSION: CT of the chest: No definitive pulmonary emboli are identified. Changes of prior right mastectomy. CT of the abdomen and pelvis: Postoperative changes at ileocecal junction consistent with the given clinical history. No acute abnormality is noted. Electronically Signed   By: Inez Catalina M.D.   On: 12/14/2019 18:34    EKG: Personally reviewed. Sinus tachycardia, rate 109, no acute ischemic changes.  Rate was faster on prior EKG.  Assessment/Plan Principal Problem:   SIRS (systemic inflammatory response syndrome) (HCC) Active Problems:   Type 2 diabetes mellitus without complication ([place])   Hypertension associated with diabetes ([place])   Asthma, moderate persistent   Hyperlipidemia associated with type 2 diabetes mellitus ([place])   Hypokalemia   Hypomagnesemia  Gail Stevenson is a 64 y.o. female with medical history significant for insulin-dependent type 2 diabetes, moderate persistent asthma, HTN, HLD, chronic leukocytosis/neutrophilia, breast  cancer s/p right mastectomy, esophageal stenosis and grade 1 esophageal varices seen on upper endoscopy 02/25/2018, and traumatic injury resulting in small bowel mesenteric injuries with hemorrhage and small bowel perforation s/p ex lap and extended ileocecectomy 01/26/2019 who is admitted with SIRS criteria and lactic acidosis without obvious infectious source.  SIRS: Meet SIRS criteria without obvious infectious source.  CT chest/abdomen/pelvis without any acute changes or evidence of pulmonary/intra-abdominal infection.  Patient does report dysuria however urinalysis not strongly suggestive of UTI.  No nuchal rigidity or suspicion for meningitis.  No skin wounds or lesions.  She was given empiric antibiotics in the ED. -Follow blood cultures, check  urine culture -SARS-CoV-2 and influenza A/B PCR's are negative -Continue IV fluid hydration, initial fluids delayed due to limited IV access -Hold further antibiotics at this time  Lactic acidosis: No clear evidence of infection as etiology.  Could be due to Metformin which patient states he has recently restarted. 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  She was seen by hematology, Dr. Lonia Chimera, in November 2019 with negative myeloid mutation panel.  Baseline WBC looks to be between 16-19.  As above, no obvious infectious source.   Generalized pain: Patient reports nonspecific generalized pain of all extremities, back, chest, abdomen, and shoulders.  She seems to notice most significant pain in her right leg and behind her right knee.  Strength is intact.  CK is normal.  Could be myopathy from medication such as statin.  Can also be related to electrolyte abnormalities or other conditions such as fibromyalgia. -Continue IV fluids as above -Hold statin -Replete potassium and magnesium -PT eval -Check  lower extremity Dopplers to rule out DVT  Hypokalemia/hypomagnesemia: Repleted with IV magnesium and oral potassium.  Recheck labs in a.m. replete further if needed.  Type 2 diabetes: A1c 6.6%.  Home regimen is currently on hold.  Placed on sensitive SSI for now and adjust as needed.  Asthma: Chronic and appears stable.  Continue Dulera, Singulair, and as needed albuterol.  Hypertension: Hypertensive on arrival.  Resume home enalapril, amlodipine, clonidine.  Holding HCTZ with mild hypokalemia.  Hyperlipidemia: Holding statin for now as above.  DVT prophylaxis: Lovenox Code Status: Full code, confirmed with patient Family Communication: Discussed with patient, she has discussed with family Disposition Plan: From home and likely discharge to home pending PT eval Consults called: None Admission status:  Status is: Observation  The patient remains OBS appropriate and will d/c before 2 midnights.  Dispo: The patient is from: Home              Anticipated d/c is to: Home              Anticipated d/c date is: 1 day              Patient currently is not medically stable to d/c.   Zada Finders MD [facility]  If 7PM-7AM, please contact night-coverage [email or web address]  12/14/2019, 7:51 PM
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
## 2019-12-14 NOTE — Progress Notes (Signed)
Notified bedside nurse of need to administer antibiotics.  

## 2019-12-14 NOTE — ED Triage Notes (Signed)
Pt here with weakness generalized pain (legs, back, chest, head). Pt states that this started about 3 days ago and she does not recall any injury. Pt in tears in triage.

## 2019-12-14 NOTE — ED Notes (Signed)
Pt has limited IV access d/t right arm restriction s/p mastectomy with lymph node removal.  LR delayed d/t incompatibility with antibiotics.

## 2019-12-14 NOTE — Progress Notes (Signed)
Notified bedside nurse of need to draw blood cultures.  

## 2019-12-15 ENCOUNTER — Other Ambulatory Visit: Payer: Self-pay

## 2019-12-15 ENCOUNTER — Encounter: Payer: Self-pay | Admitting: Internal Medicine

## 2019-12-15 ENCOUNTER — Observation Stay: Payer: Medicare HMO

## 2019-12-15 DIAGNOSIS — R6510 Systemic inflammatory response syndrome (SIRS) of non-infectious origin without acute organ dysfunction: Secondary | ICD-10-CM | POA: Diagnosis not present

## 2019-12-15 DIAGNOSIS — R000 Tachycardia, unspecified: Secondary | ICD-10-CM | POA: Diagnosis not present

## 2019-12-15 LAB — CBC
HCT: 38.9 % (ref 36.0–46.0)
Hemoglobin: 12.9 g/dL (ref 12.0–15.0)
MCH: 30 pg (ref 26.0–34.0)
MCHC: 33.2 g/dL (ref 30.0–36.0)
MCV: 90.5 fL (ref 80.0–100.0)
Platelets: 353 10*3/uL (ref 150–400)
RBC: 4.3 MIL/uL (ref 3.87–5.11)
RDW: 13 % (ref 11.5–15.5)
WBC: 24.7 10*3/uL — ABNORMAL HIGH (ref 4.0–10.5)
nRBC: 0 % (ref 0.0–0.2)

## 2019-12-15 LAB — LACTIC ACID, PLASMA
Lactic Acid, Venous: 2.1 mmol/L (ref 0.5–1.9)
Lactic Acid, Venous: 2.3 mmol/L (ref 0.5–1.9)
Lactic Acid, Venous: 2.2 mmol/L (ref 0.5–1.9)

## 2019-12-15 LAB — PHOSPHORUS: Phosphorus: 2.3 mg/dL — ABNORMAL LOW (ref 2.5–4.6)

## 2019-12-15 LAB — BASIC METABOLIC PANEL
Anion gap: 12 (ref 5–15)
BUN: 12 mg/dL (ref 8–23)
CO2: 26 mmol/L (ref 22–32)
Calcium: 8.7 mg/dL — ABNORMAL LOW (ref 8.9–10.3)
Chloride: 98 mmol/L (ref 98–111)
Creatinine, Ser: 0.96 mg/dL (ref 0.44–1.00)
GFR, Estimated: >60 mL/min (ref >60)
Glucose, Bld: 228 mg/dL — ABNORMAL HIGH (ref 70–99)
Potassium: 3.7 mmol/L (ref 3.5–5.1)
Sodium: 136 mmol/L (ref 135–145)

## 2019-12-15 LAB — GLUCOSE, CAPILLARY
Glucose-Capillary: 118 mg/dL — ABNORMAL HIGH (ref 70–99)
Glucose-Capillary: 200 mg/dL — ABNORMAL HIGH (ref 70–99)
Glucose-Capillary: 141 mg/dL — ABNORMAL HIGH (ref 70–99)
Glucose-Capillary: 218 mg/dL — ABNORMAL HIGH (ref 70–99)

## 2019-12-15 LAB — MAGNESIUM: Magnesium: 1.7 mg/dL (ref 1.7–2.4)

## 2019-12-15 MED ORDER — MAGNESIUM SULFATE 2 GM/50ML IV SOLN
2.0000 g | Freq: Once | INTRAVENOUS | Status: AC
  Administered 2019-12-15: 2 g via INTRAVENOUS
  Filled 2019-12-15: qty 50

## 2019-12-15 MED ORDER — K PHOS MONO-SOD PHOS DI & MONO 155-852-130 MG PO TABS
500.0000 mg | ORAL_TABLET | ORAL | Status: AC
  Administered 2019-12-15 (×2): 500 mg via ORAL
  Filled 2019-12-15 (×2): qty 2

## 2019-12-15 MED ORDER — SODIUM CHLORIDE 0.9 % IV SOLN: INTRAVENOUS | Status: DC

## 2019-12-15 NOTE — Care Plan (Signed)
Pt lactic acid is 2.3. MD Amery notified via message, MD states OK. Pt denies symptoms. VSS at this time. Magnesium infusing as ordered.

## 2019-12-15 NOTE — Progress Notes (Signed)
OT Cancellation Note  Patient Details Name: Tanaysha Alkins MRN: [id number] DOB: 09/03/1955   Cancelled Treatment:    Reason Eval/Treat Not Completed: Patient at procedure or test/ unavailable. OT order received and chart reviewed. Upon arrival labs at bedside drawing blood. Will follow up at later date/time as able.   Dessie Coma, M.S. OTR/L  12/15/19, 3:37 PM  ascom [phone]
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
## 2019-12-15 NOTE — Progress Notes (Signed)
PT Cancellation Note  Patient Details Name: Gail Stevenson MRN: [id number] DOB: Apr 08, 1955   Cancelled Treatment:    Reason Eval/Treat Not Completed: Patient at procedure or test/unavailable Chart reviewed, on arrival pt was out of room for [place].  Will try back later as pt is appropriate and time allows.   Kreg Shropshire, DPT 12/15/2019, 9:29 AM

## 2019-12-16 DIAGNOSIS — R000 Tachycardia, unspecified: Secondary | ICD-10-CM | POA: Diagnosis not present

## 2019-12-16 DIAGNOSIS — R6510 Systemic inflammatory response syndrome (SIRS) of non-infectious origin without acute organ dysfunction: Secondary | ICD-10-CM | POA: Diagnosis not present

## 2019-12-16 LAB — URINE CULTURE

## 2019-12-16 LAB — GLUCOSE, CAPILLARY
Glucose-Capillary: 132 mg/dL — ABNORMAL HIGH (ref 70–99)
Glucose-Capillary: 206 mg/dL — ABNORMAL HIGH (ref 70–99)

## 2019-12-16 LAB — PHOSPHORUS: Phosphorus: 2.8 mg/dL (ref 2.5–4.6)

## 2019-12-16 LAB — MAGNESIUM: Magnesium: 1.9 mg/dL (ref 1.7–2.4)

## 2019-12-16 MED ORDER — ENALAPRIL MALEATE 20 MG PO TABS
20.0000 mg | ORAL_TABLET | Freq: Two times a day (BID) | ORAL | 0 refills | Status: DC
Start: 2019-12-16 — End: 2021-04-03

## 2019-12-16 MED ORDER — OXYCODONE HCL 5 MG PO TABS
5.0000 mg | ORAL_TABLET | Freq: Once | ORAL | Status: AC
  Administered 2019-12-16: 5 mg via ORAL
  Filled 2019-12-16: qty 1

## 2019-12-16 MED ORDER — HYDROXYZINE HCL 25 MG PO TABS: 25.0000 mg | ORAL_TABLET | Freq: Three times a day (TID) | ORAL | 0 refills | Status: DC | PRN

## 2019-12-16 MED ORDER — CLONIDINE HCL 0.1 MG PO TABS
0.1000 mg | ORAL_TABLET | Freq: Two times a day (BID) | ORAL | 5 refills | Status: DC
Start: 2019-12-16 — End: 2020-04-27
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
## 2019-12-16 NOTE — Treatment Plan (Signed)
Pt educated on AVS at this time. Denies questions. IV removed.

## 2019-12-16 NOTE — Care Management Obs Status (Signed)
[place] NOTIFICATION   Patient Details  Name: Gail Stevenson MRN: [id number] Date of Birth: 06/12/1955   Medicare Observation Status Notification Given:  Yes    Beverly Sessions, RN 12/16/2019, 10:45 AM

## 2019-12-16 NOTE — Progress Notes (Signed)
Mobility Specialist - Progress Note   12/16/19 1128  Mobility  Activity Ambulated to bathroom  Range of Motion/Exercises All extremities (Donned on clothing)  Level of Assistance Modified independent, requires aide device or extra time  Assistive Device None;Front wheel walker  Distance Ambulated (ft) 20 ft  Mobility Response Tolerated well  Mobility performed by Mobility specialist  $Mobility charge 1 Mobility    Pre-mobility: 103 HR, 99% SpO2 During mobility: 108 HR, 96% SpO2 Post-mobility: 108 HR, 98% SpO2   Pt was sleeping in bed upon arrival. Pt was easily awakened and agreed to session. Pt c/o soreness in back, neck, shoulder, and LE but was not limited for mobility. Pt was able to don clothing (pants, shirt, and socks) with SBA. Pt stood to RW and ambulated to bathroom modI'ly. Pt was independent in performing hygiene tasks. Pt denied any dizziness or weakness during activity. Pt denied SOB. Overall, pt tolerated session well. Pt was left in bed with all needs in reach. Nurse entered at the end of session.    Kathee Delton Mobility Specialist 12/16/19, 11:32 AM

## 2019-12-16 NOTE — Discharge Summary (Signed)
Physician Discharge Summary  Gail Stevenson YWV:[id number] DOB: 12-Dec-1955 DOA: 12/14/2019  PCP: Jonelle Sports, MD  Admit date: 12/14/2019 Discharge date: 12/16/2019  Admitted From: Home Disposition: Home   Recommendations for Outpatient Follow-up:  1. Follow up with PCP in 1-2 weeks 2. Please obtain BMP/CBC in one week 3. Please follow up on the following pending results:None  Home Health:Yes Equipment/Devices:None Discharge Condition: Stable CODE STATUS: Full Diet recommendation: Heart Healthy / Carb Modified   Brief/Interim Summary: Gail Robinsonis a 64 y.o.femalewith medical history significant forinsulin-dependent type 2 diabetes, moderate persistent asthma, HTN, HLD,chronic leukocytosis/neutrophilia,breast cancer s/p right mastectomy, esophageal stenosis and grade 1 esophageal varices seen on upper endoscopy 02/25/2018,and traumatic injury resulting in small bowel mesenteric injuries with hemorrhage and small bowel perforation s/p ex lap and extended ileocecectomy 01/26/2019 who presents to the ED for evaluation of generalized pain and weakness. Initially met SIRS criteria and lactic acidosis without any source of infection. On chart review patient has mild lactic acidosis dated back in December 2020.  Patient was on Metformin which was discontinued and we are asked patient to discuss with her primary care provider so they can start her on a different agent and see if that will result in resolution of lactic acidosis.  Patient has nonspecific muscle pains.  Which improved today by holding her Lipitor.  May be statin induced.  We asked patient to continue holding Lipitor and discussed with her primary care provider.  She can either restart at a lower dose along with Q 10 or they can discussed other treatment options.  Patient also has leukocytosis which seems chronic with neutrophilia.She was seen by hematology, Dr. Lonia Chimera, in November 2019 with negative myeloid  mutation panel.   Patient also found to have hypokalemia and hypomagnesemia which was repleted.  She will continue with rest of her home medicine and follow-up with her primary care provider for further management.  Discharge Diagnoses:  Principal Problem:   SIRS (systemic inflammatory response syndrome) (HCC) Active Problems:   Type 2 diabetes mellitus without complication (HCC)   Hypertension associated with diabetes (HCC)   Asthma, moderate persistent   Hyperlipidemia associated with type 2 diabetes mellitus (HCC)   Hypokalemia   Hypomagnesemia   Discharge Instructions  Discharge Instructions    Diet - low sodium heart healthy   Complete by: As directed    Discharge instructions   Complete by: As directed    It was pleasure taking care of you. Please stop taking Metformin as it might be causing some lactic acidosis which seems to be present December last year.  You can discussed with your primary care provider and they can start you on a different medication for your diabetes. Continue holding your Lipitor for about a week and see if that will help with your muscle pain, if your muscle pain resolved then continue holding Lipitor and discuss with your primary care provider for an alternative.  We can always start at a lower dose and see if that will not cause muscle pains.You can also try Q10 with it and see if that improves your muscle pain. Continue taking rest of your medications and have a close follow-up with your primary care provider within a week for further recommendations   Increase activity slowly   Complete by: As directed      Allergies as of 12/16/2019      Reactions   Patanol [olopatadine]    Paroxetine Hcl Rash      Medication List  STOP taking these medications   lisinopril 40 MG tablet Commonly known as: ZESTRIL   metFORMIN 500 MG tablet Commonly known as: GLUCOPHAGE     TAKE these medications   acetaminophen 325 MG tablet Commonly known as:  TYLENOL Take 2 tablets (650 mg total) by mouth every 6 (six) hours.   Advair Diskus 500-50 MCG/DOSE Aepb Generic drug: Fluticasone-Salmeterol Inhale 1 puff into the lungs 2 (two) times daily.   amLODipine 10 MG tablet Commonly known as: NORVASC Take 10 mg by mouth every evening.   aspirin EC 81 MG tablet Take 81 mg by mouth daily.   atorvastatin 40 MG tablet Commonly known as: LIPITOR Take 40 mg by mouth at bedtime.   cloNIDine 0.1 MG tablet Commonly known as: CATAPRES Take 1 tablet (0.1 mg total) by mouth 2 (two) times daily.   cyclobenzaprine 5 MG tablet Commonly known as: FLEXERIL Take 5 mg by mouth 3 (three) times daily as needed for muscle spasms.   enalapril 20 MG tablet Commonly known as: VASOTEC Take 1 tablet (20 mg total) by mouth 2 (two) times daily. What changed: medication strength   ferrous sulfate 220 (44 Fe) MG/5ML solution Take 220 mg by mouth daily.   hydrOXYzine 25 MG tablet Commonly known as: ATARAX/VISTARIL Take 1 tablet (25 mg total) by mouth every 8 (eight) hours as needed for anxiety (or sleep).   Lantus SoloStar 100 UNIT/ML Solostar Pen Generic drug: insulin glargine Inject 50 Units into the skin at bedtime.   loperamide 2 MG capsule Commonly known as: IMODIUM Take 2 mg by mouth every 6 (six) hours as needed for diarrhea or loose stools.   montelukast 10 MG tablet Commonly known as: SINGULAIR Take 10 mg by mouth at bedtime.   potassium chloride 20 MEQ packet Commonly known as: KLOR-CON Take 40 mEq by mouth in the morning and at bedtime.   ProAir HFA 108 (90 Base) MCG/ACT inhaler Generic drug: albuterol Inhale 2 puffs into the lungs every 6 (six) hours as needed for wheezing or shortness of breath.       Follow-up Information    Al Corpus, MD. Schedule an appointment as soon as possible for a visit in 1 week(s).   Specialty: Obstetrics and Gynecology Contact information: [id number] [facility]  [place] [address] [phone]              Allergies  Allergen Reactions   Patanol [Olopatadine]    Paroxetine Hcl Rash    Consultations:  None  Procedures/Studies: DG Chest 2 View  Result Date: 12/14/2019 CLINICAL DATA:  Weakness, tachycardia. EXAM: CHEST - 2 VIEW COMPARISON:  01/28/2019. FINDINGS: The heart size and mediastinal contours are within normal limits. No consolidation. No visible pleural effusions or pneumothorax. The visualized skeletal structures are unremarkable. Clips project in the right axilla. IMPRESSION: No acute cardiopulmonary disease. Electronically Signed   By: Feliberto Harts MD   On: 12/14/2019 14:28   CT Angio Chest PE W and/or Wo Contrast  Result Date: 12/14/2019 CLINICAL DATA:  Generalized pain and weakness, initial encounter EXAM: CT ANGIOGRAPHY CHEST CT ABDOMEN AND PELVIS WITH CONTRAST TECHNIQUE: Multidetector CT imaging of the chest was performed using the standard protocol during bolus administration of intravenous contrast. Multiplanar CT image reconstructions and MIPs were obtained to evaluate the vascular anatomy. Multidetector CT imaging of the abdomen and pelvis was performed using the standard protocol during bolus administration of intravenous contrast. CONTRAST:  OMNIPAQUE IOHEXOL 350 MG/ML SOLN  COMPARISON:  01/25/2019 FINDINGS: CTA CHEST FINDINGS Cardiovascular: Thoracic aorta demonstrates a normal branching pattern. Atherosclerotic calcifications are noted. No aneurysmal dilatation or dissection is seen. No cardiac enlargement is seen. The pulmonary artery shows a normal branching pattern. The peripheral branches are somewhat limited due to timing of the contrast bolus although no large central embolus is seen. Mediastinum/Nodes: Thoracic inlet is within normal limits. No sizable hilar or mediastinal adenopathy is noted. The esophagus as visualized is within normal limits. Lungs/Pleura: Lungs are well aerated bilaterally. No  focal infiltrate, effusion or pneumothorax is seen. No focal contusion is noted. Musculoskeletal: Changes of right mastectomy. No acute bony abnormality is noted. Review of the MIP images confirms the above findings. CT ABDOMEN and PELVIS FINDINGS Hepatobiliary: No focal liver abnormality is seen. No gallstones, gallbladder wall thickening, or biliary dilatation. Pancreas: Unremarkable. No pancreatic ductal dilatation or surrounding inflammatory changes. Spleen: Normal in size without focal abnormality. Adrenals/Urinary Tract: Adrenal glands are within normal limits. Kidneys are well visualized bilaterally. Normal enhancement pattern is noted. No renal calculi or obstructive changes are seen. Bladder is partially distended. Delayed images demonstrate normal excretion of contrast. Stomach/Bowel: Scattered diverticular change of the colon is noted. No obstructive or inflammatory changes of the colon are seen. Changes consistent with the known history of ileocecectomy are seen. The remainder of the small bowel and stomach are within normal limits. Vascular/Lymphatic: Aortic atherosclerosis. No enlarged abdominal or pelvic lymph nodes. Reproductive: Status post hysterectomy. No adnexal masses. Other: No abdominal wall hernia or abnormality. No abdominopelvic ascites. Musculoskeletal: Degenerative changes of lumbar spine are noted. Previously seen right pubic rami fractures have healed in the interval. Review of the MIP images confirms the above findings. IMPRESSION: CT of the chest: No definitive pulmonary emboli are identified. Changes of prior right mastectomy. CT of the abdomen and pelvis: Postoperative changes at ileocecal junction consistent with the given clinical history. No acute abnormality is noted. Electronically Signed   By: Inez Catalina M.D.   On: 12/14/2019 18:34   CT ABDOMEN PELVIS W CONTRAST  Result Date: 12/14/2019 CLINICAL DATA:  Generalized pain and weakness, initial encounter EXAM: CT ANGIOGRAPHY  CHEST CT ABDOMEN AND PELVIS WITH CONTRAST TECHNIQUE: Multidetector CT imaging of the chest was performed using the standard protocol during bolus administration of intravenous contrast. Multiplanar CT image reconstructions and MIPs were obtained to evaluate the vascular anatomy. Multidetector CT imaging of the abdomen and pelvis was performed using the standard protocol during bolus administration of intravenous contrast. CONTRAST:  141mL OMNIPAQUE IOHEXOL 350 MG/ML SOLN COMPARISON:  01/25/2019 FINDINGS: CTA CHEST FINDINGS Cardiovascular: Thoracic aorta demonstrates a normal branching pattern. Atherosclerotic calcifications are noted. No aneurysmal dilatation or dissection is seen. No cardiac enlargement is seen. The pulmonary artery shows a normal branching pattern. The peripheral branches are somewhat limited due to timing of the contrast bolus although no large central embolus is seen. Mediastinum/Nodes: Thoracic inlet is within normal limits. No sizable hilar or mediastinal adenopathy is noted. The esophagus as visualized is within normal limits. Lungs/Pleura: Lungs are well aerated bilaterally. No focal infiltrate, effusion or pneumothorax is seen. No focal contusion is noted. Musculoskeletal: Changes of right mastectomy. No acute bony abnormality is noted. Review of the MIP images confirms the above findings. CT ABDOMEN and PELVIS FINDINGS Hepatobiliary: No focal liver abnormality is seen. No gallstones, gallbladder wall thickening, or biliary dilatation. Pancreas: Unremarkable. No pancreatic ductal dilatation or surrounding inflammatory changes. Spleen: Normal in size without focal abnormality. Adrenals/Urinary Tract: Adrenal glands are within normal  limits. Kidneys are well visualized bilaterally. Normal enhancement pattern is noted. No renal calculi or obstructive changes are seen. Bladder is partially distended. Delayed images demonstrate normal excretion of contrast. Stomach/Bowel: Scattered diverticular  change of the colon is noted. No obstructive or inflammatory changes of the colon are seen. Changes consistent with the known history of ileocecectomy are seen. The remainder of the small bowel and stomach are within normal limits. Vascular/Lymphatic: Aortic atherosclerosis. No enlarged abdominal or pelvic lymph nodes. Reproductive: Status post hysterectomy. No adnexal masses. Other: No abdominal wall hernia or abnormality. No abdominopelvic ascites. Musculoskeletal: Degenerative changes of lumbar spine are noted. Previously seen right pubic rami fractures have healed in the interval. Review of the MIP images confirms the above findings. IMPRESSION: CT of the chest: No definitive pulmonary emboli are identified. Changes of prior right mastectomy. CT of the abdomen and pelvis: Postoperative changes at ileocecal junction consistent with the given clinical history. No acute abnormality is noted. Electronically Signed   By: Inez Catalina M.D.   On: 12/14/2019 18:34   US Venous Img Lower Bilateral (DVT)  Result Date: 12/15/2019 CLINICAL DATA:  Shortness of breath.  Leg pain. EXAM: BILATERAL LOWER EXTREMITY VENOUS DOPPLER ULTRASOUND TECHNIQUE: Gray-scale sonography with graded compression, as well as color Doppler and duplex ultrasound were performed to evaluate the lower extremity deep venous systems from the level of the common femoral vein and including the common femoral, femoral, profunda femoral, popliteal and calf veins including the posterior tibial, peroneal and gastrocnemius veins when visible. The superficial great saphenous vein was also interrogated. Spectral Doppler was utilized to evaluate flow at rest and with distal augmentation maneuvers in the common femoral, femoral and popliteal veins. COMPARISON:  None. FINDINGS: RIGHT LOWER EXTREMITY Common Femoral Vein: No evidence of thrombus. Normal compressibility, respiratory phasicity and response to augmentation. Saphenofemoral Junction: No evidence of  thrombus. Normal compressibility and flow on color Doppler imaging. Profunda Femoral Vein: No evidence of thrombus. Normal compressibility and flow on color Doppler imaging. Femoral Vein: No evidence of thrombus. Normal compressibility, respiratory phasicity and response to augmentation. Popliteal Vein: No evidence of thrombus. Normal compressibility, respiratory phasicity and response to augmentation. Calf Veins: No evidence of thrombus. Normal compressibility and flow on color Doppler imaging. Superficial Great Saphenous Vein: No evidence of thrombus. Normal compressibility. Other Findings:  None. LEFT LOWER EXTREMITY Common Femoral Vein: No evidence of thrombus. Normal compressibility, respiratory phasicity and response to augmentation. Saphenofemoral Junction: No evidence of thrombus. Normal compressibility and flow on color Doppler imaging. Profunda Femoral Vein: No evidence of thrombus. Normal compressibility and flow on color Doppler imaging. Femoral Vein: No evidence of thrombus. Normal compressibility, respiratory phasicity and response to augmentation. Popliteal Vein: No evidence of thrombus. Normal compressibility, respiratory phasicity and response to augmentation. Calf Veins: No evidence of thrombus. Normal compressibility and flow on color Doppler imaging. Superficial Great Saphenous Vein: No evidence of thrombus. Normal compressibility. Other Findings:  None. IMPRESSION: No evidence of deep venous thrombosis in either lower extremity. Electronically Signed   By: Marcello Moores  Register   On: 12/15/2019 09:07     Subjective: Patient was seen and examined during morning rounds today.  Has no new complaint.  She was feeling better.  Muscle pain improved.  Had a discussion regarding Metformin and Lipitor and she will discuss with her provider.  Discharge Exam: Vitals:   12/16/19 0339 12/16/19 0802  BP: (!) 153/73 (!) 168/87  Pulse: 100 (!) 107  Resp: 20 20  Temp: 98.1 F (36.7 C)  98.4 F (36.9 C)   SpO2: 100% 100%   Vitals:   12/15/19 2055 12/15/19 2305 12/16/19 0339 12/16/19 0802  BP: (!) 155/68 (!) 156/72 (!) 153/73 (!) 168/87  Pulse: (!) 109 (!) 102 100 (!) 107  Resp: $Remo'18 20 20 20  'XeToC$ Temp: 98.9 F (37.2 C) 97.6 F (36.4 C) 98.1 F (36.7 C) 98.4 F (36.9 C)  TempSrc: Oral Oral Oral Oral  SpO2: 99% 98% 100% 100%  Weight:      Height:        General: Pt is alert, awake, not in acute distress Cardiovascular: RRR, S1/S2 +, no rubs, no gallops Respiratory: CTA bilaterally, no wheezing, no rhonchi Abdominal: Soft, NT, ND, bowel sounds + Extremities: no edema, no cyanosis   The results of significant diagnostics from this hospitalization (including imaging, microbiology, ancillary and laboratory) are listed below for reference.    Microbiology: Recent Results (from the past 240 hour(s))  Respiratory Panel by RT PCR (Flu A&B, Covid) - Nasopharyngeal Swab     Status: None   Collection Time: 12/14/19  3:58 PM   Specimen: Nasopharyngeal Swab  Result Value Ref Range Status   SARS Coronavirus 2 by RT PCR NEGATIVE NEGATIVE Final    Comment: (NOTE) SARS-CoV-2 target nucleic acids are NOT DETECTED.  The SARS-CoV-2 RNA is generally detectable in upper respiratoy specimens during the acute phase of infection. The lowest concentration of SARS-CoV-2 viral copies this assay can detect is 131 copies/mL. A negative result does not preclude SARS-Cov-2 infection and should not be used as the sole basis for treatment or other patient management decisions. A negative result may occur with  improper specimen collection/handling, submission of specimen other than nasopharyngeal swab, presence of viral mutation(s) within the areas targeted by this assay, and inadequate number of viral copies (<131 copies/mL). A negative result must be combined with clinical observations, patient history, and epidemiological information. The expected result is Negative.  Fact Sheet for Patients:   PinkCheek.be  Fact Sheet for Healthcare Providers:  GravelBags.it  This test is no t yet approved or cleared by the [place] FDA and  has been authorized for detection and/or diagnosis of SARS-CoV-2 by FDA under an Emergency Use Authorization (EUA). This EUA will remain  in effect (meaning this test can be used) for the duration of the COVID-19 declaration under Section 564(b)(1) of the Act, 21 U.S.C. section 360bbb-3(b)(1), unless the authorization is terminated or revoked sooner.     Influenza A by PCR NEGATIVE NEGATIVE Final   Influenza B by PCR NEGATIVE NEGATIVE Final    Comment: (NOTE) The Xpert Xpress SARS-CoV-2/FLU/RSV assay is intended as an aid in  the diagnosis of influenza from Nasopharyngeal swab specimens and  should not be used as a sole basis for treatment. Nasal washings and  aspirates are unacceptable for Xpert Xpress SARS-CoV-2/FLU/RSV  testing.  Fact Sheet for Patients: PinkCheek.be  Fact Sheet for Healthcare Providers: GravelBags.it  This test is not yet approved or cleared by the [place] FDA and  has been authorized for detection and/or diagnosis of SARS-CoV-2 by  FDA under an Emergency Use Authorization (EUA). This EUA will remain  in effect (meaning this test can be used) for the duration of the  Covid-19 declaration under Section 564(b)(1) of the Act, 21  U.S.C. section 360bbb-3(b)(1), unless the authorization is  terminated or revoked. Performed at [facility], [address] [place],  [address]   Blood culture (routine x 2)     Status: None (  Preliminary result)   Collection Time: 12/14/19  3:58 PM   Specimen: BLOOD  Result Value Ref Range Status   Specimen Description BLOOD BLOOD LEFT HAND  Final   Special Requests   Final    BOTTLES DRAWN AEROBIC AND ANAEROBIC Blood Culture adequate volume   Culture    Final    NO GROWTH 2 DAYS Performed at [facility], [address], [place], [place] [address]    Report Status PENDING  Incomplete  Blood culture (routine x 2)     Status: None (Preliminary result)   Collection Time: 12/14/19  5:22 PM   Specimen: BLOOD  Result Value Ref Range Status   Specimen Description BLOOD BLOOD LEFT HAND  Final   Special Requests   Final    BOTTLES DRAWN AEROBIC AND ANAEROBIC Blood Culture results may not be optimal due to an inadequate volume of blood received in culture bottles   Culture   Final    NO GROWTH 2 DAYS Performed at [facility], [address] [place], [place] [address]    Report Status PENDING  Incomplete  Urine culture     Status: Abnormal   Collection Time: 12/14/19  7:15 PM   Specimen: In/Out Cath Urine  Result Value Ref Range Status   Specimen Description   Final    IN/OUT CATH URINE Performed at [facility], [address], [place], [place] [address]    Special Requests   Final    NONE Performed at [facility], [address] [place], [place] [address]    Culture MULTIPLE SPECIES PRESENT, SUGGEST RECOLLECTION (A)  Final   Report Status 12/16/2019 FINAL  Final     Labs: BNP (last 3 results) No results for input(s): BNP in the last 8760 hours. Basic Metabolic Panel: Recent Labs  Lab 12/14/19 1119 12/14/19 1722 12/15/19 0042 12/16/19 0504  NA 134*  --  136  --   K 3.4*  --  3.7  --   CL 94*  --  98  --   CO2 27  --  26  --   GLUCOSE 257*  --  228*  --   BUN 15  --  12  --   CREATININE 1.02*  --  0.96  --   CALCIUM 9.4  --  8.7*  --   MG  --  1.4* 1.7 1.9  PHOS  --   --  2.3* 2.8   Liver Function Tests: Recent Labs  Lab 12/14/19 1722  AST 15  ALT 15  ALKPHOS 88  BILITOT 1.0  PROT 9.1*  ALBUMIN 4.0   Recent Labs  Lab 12/14/19 2018  LIPASE 21   No results for input(s): AMMONIA in the last 168 hours. CBC: Recent Labs  Lab 12/14/19 1119 12/15/19 0042  WBC 23.3*  24.7*  HGB 13.7 12.9  HCT 41.3 38.9  MCV 90.2 90.5  PLT 396 353   Cardiac Enzymes: Recent Labs  Lab 12/14/19 1741  CKTOTAL 67   BNP: Invalid input(s): POCBNP CBG: Recent Labs  Lab 12/15/19 0801 12/15/19 1115 12/15/19 1541 12/15/19 2051 12/16/19 0749  GLUCAP 118* 200* 141* 218* 132*   D-Dimer No results for input(s): DDIMER in the last 72 hours. Hgb A1c Recent Labs    12/14/19 1722  HGBA1C 6.6*   Lipid Profile No results for input(s): CHOL, HDL, LDLCALC, TRIG, CHOLHDL, LDLDIRECT in the last 72 hours. Thyroid function studies Recent Labs    12/14/19 2018  TSH  1.887   Anemia work up No results for input(s): VITAMINB12, FOLATE, FERRITIN, TIBC, IRON, RETICCTPCT in the last 72 hours. Urinalysis    Component Value Date/Time   COLORURINE YELLOW 12/14/2019 1119   APPEARANCEUR CLEAR 12/14/2019 1119   LABSPEC 1.025 12/14/2019 1119   PHURINE 6.0 12/14/2019 1119   GLUCOSEU NEGATIVE 12/14/2019 1119   HGBUR MODERATE (A) 12/14/2019 1119   BILIRUBINUR NEGATIVE 12/14/2019 1119   KETONESUR NEGATIVE 12/14/2019 1119   PROTEINUR >300 (A) 12/14/2019 1119   NITRITE NEGATIVE 12/14/2019 1119   LEUKOCYTESUR NEGATIVE 12/14/2019 1119   Sepsis Labs Invalid input(s): PROCALCITONIN,  WBC,  LACTICIDVEN Microbiology Recent Results (from the past 240 hour(s))  Respiratory Panel by RT PCR (Flu A&B, Covid) - Nasopharyngeal Swab     Status: None   Collection Time: 12/14/19  3:58 PM   Specimen: Nasopharyngeal Swab  Result Value Ref Range Status   SARS Coronavirus 2 by RT PCR NEGATIVE NEGATIVE Final    Comment: (NOTE) SARS-CoV-2 target nucleic acids are NOT DETECTED.  The SARS-CoV-2 RNA is generally detectable in upper respiratoy specimens during the acute phase of infection. The lowest concentration of SARS-CoV-2 viral copies this assay can detect is 131 copies/mL. A negative result does not preclude SARS-Cov-2 infection and should not be used as the sole basis for treatment  or other patient management decisions. A negative result may occur with  improper specimen collection/handling, submission of specimen other than nasopharyngeal swab, presence of viral mutation(s) within the areas targeted by this assay, and inadequate number of viral copies (<131 copies/mL). A negative result must be combined with clinical observations, patient history, and epidemiological information. The expected result is Negative.  Fact Sheet for Patients:  PinkCheek.be  Fact Sheet for Healthcare Providers:  GravelBags.it  This test is no t yet approved or cleared by the [place] FDA and  has been authorized for detection and/or diagnosis of SARS-CoV-2 by FDA under an Emergency Use Authorization (EUA). This EUA will remain  in effect (meaning this test can be used) for the duration of the COVID-19 declaration under Section 564(b)(1) of the Act, 21 U.S.C. section 360bbb-3(b)(1), unless the authorization is terminated or revoked sooner.     Influenza A by PCR NEGATIVE NEGATIVE Final   Influenza B by PCR NEGATIVE NEGATIVE Final    Comment: (NOTE) The Xpert Xpress SARS-CoV-2/FLU/RSV assay is intended as an aid in  the diagnosis of influenza from Nasopharyngeal swab specimens and  should not be used as a sole basis for treatment. Nasal washings and  aspirates are unacceptable for Xpert Xpress SARS-CoV-2/FLU/RSV  testing.  Fact Sheet for Patients: PinkCheek.be  Fact Sheet for Healthcare Providers: GravelBags.it  This test is not yet approved or cleared by the [place] FDA and  has been authorized for detection and/or diagnosis of SARS-CoV-2 by  FDA under an Emergency Use Authorization (EUA). This EUA will remain  in effect (meaning this test can be used) for the duration of the  Covid-19 declaration under Section 564(b)(1) of the Act, 21  U.S.C.  section 360bbb-3(b)(1), unless the authorization is  terminated or revoked. Performed at [facility], [address] [place], [place] [address]   Blood culture (routine x 2)     Status: None (Preliminary result)   Collection Time: 12/14/19  3:58 PM   Specimen: BLOOD  Result Value Ref Range Status   Specimen Description BLOOD BLOOD LEFT HAND  Final   Special Requests   Final    BOTTLES DRAWN AEROBIC AND  ANAEROBIC Blood Culture adequate volume   Culture   Final    NO GROWTH 2 DAYS Performed at [facility], [address] [place], [place] [address]    Report Status PENDING  Incomplete  Blood culture (routine x 2)     Status: None (Preliminary result)   Collection Time: 12/14/19  5:22 PM   Specimen: BLOOD  Result Value Ref Range Status   Specimen Description BLOOD BLOOD LEFT HAND  Final   Special Requests   Final    BOTTLES DRAWN AEROBIC AND ANAEROBIC Blood Culture results may not be optimal due to an inadequate volume of blood received in culture bottles   Culture   Final    NO GROWTH 2 DAYS Performed at [facility], [address] [place], [place] [address]    Report Status PENDING  Incomplete  Urine culture     Status: Abnormal   Collection Time: 12/14/19  7:15 PM   Specimen: In/Out Cath Urine  Result Value Ref Range Status   Specimen Description   Final    IN/OUT CATH URINE Performed at [facility], [address] [place], [place] [address]    Special Requests   Final    NONE Performed at [facility], [address] [place], [place] [address]    Culture MULTIPLE SPECIES PRESENT, SUGGEST RECOLLECTION (A)  Final   Report Status 12/16/2019 FINAL  Final    Time coordinating discharge: Over 30 minutes  SIGNED:  Lorella Nimrod, MD  [facility] 12/16/2019, 10:48 AM  If 7PM-7AM, please contact night-coverage [email or web address]  This record has been created using Systems analyst. Errors have  been sought and corrected,but may not always be located. Such creation errors do not reflect on the standard of care.

## 2019-12-16 NOTE — Progress Notes (Signed)
[place] attempted visit per OR for AD education/completion; pt. not in rm.  Per case manager pt. was ready for discharge earlier this afternoon; pt. appears to be discharged.  CH remains available as needed.

## 2019-12-19 LAB — CULTURE, BLOOD (ROUTINE X 2)
Culture: NO GROWTH
Culture: NO GROWTH
Special Requests: ADEQUATE

## 2020-03-08 ENCOUNTER — Other Ambulatory Visit: Payer: Medicare Other

## 2020-03-08 DIAGNOSIS — Z20822 Contact with and (suspected) exposure to covid-19: Secondary | ICD-10-CM

## 2020-03-09 LAB — NOVEL CORONAVIRUS, NAA: SARS-CoV-2, NAA: DETECTED — AB

## 2020-03-09 LAB — SARS-COV-2, NAA 2 DAY TAT

## 2020-04-05 ENCOUNTER — Encounter: Payer: Self-pay | Admitting: *Deleted

## 2020-04-20 ENCOUNTER — Inpatient Hospital Stay: Payer: Medicare Other | Admitting: Internal Medicine

## 2020-04-20 ENCOUNTER — Other Ambulatory Visit: Payer: Self-pay | Admitting: Obstetrics and Gynecology

## 2020-04-20 ENCOUNTER — Inpatient Hospital Stay: Payer: Medicare Other

## 2020-04-26 ENCOUNTER — Encounter: Payer: Self-pay | Admitting: Internal Medicine

## 2020-04-26 ENCOUNTER — Other Ambulatory Visit: Payer: Self-pay

## 2020-04-27 ENCOUNTER — Inpatient Hospital Stay: Payer: Medicare Other

## 2020-04-27 ENCOUNTER — Inpatient Hospital Stay: Payer: Medicare Other | Attending: Internal Medicine | Admitting: Internal Medicine

## 2020-04-27 ENCOUNTER — Encounter: Payer: Self-pay | Admitting: Internal Medicine

## 2020-04-27 DIAGNOSIS — I10 Essential (primary) hypertension: Secondary | ICD-10-CM | POA: Insufficient documentation

## 2020-04-27 DIAGNOSIS — Z794 Long term (current) use of insulin: Secondary | ICD-10-CM | POA: Diagnosis not present

## 2020-04-27 DIAGNOSIS — Z803 Family history of malignant neoplasm of breast: Secondary | ICD-10-CM | POA: Diagnosis not present

## 2020-04-27 DIAGNOSIS — E119 Type 2 diabetes mellitus without complications: Secondary | ICD-10-CM | POA: Insufficient documentation

## 2020-04-27 DIAGNOSIS — M8580 Other specified disorders of bone density and structure, unspecified site: Secondary | ICD-10-CM | POA: Insufficient documentation

## 2020-04-27 DIAGNOSIS — D75839 Thrombocytosis, unspecified: Secondary | ICD-10-CM

## 2020-04-27 DIAGNOSIS — Z87891 Personal history of nicotine dependence: Secondary | ICD-10-CM | POA: Insufficient documentation

## 2020-04-27 LAB — COMPREHENSIVE METABOLIC PANEL
ALT: 17 U/L (ref 0–44)
AST: 23 U/L (ref 15–41)
Albumin: 3.9 g/dL (ref 3.5–5.0)
Alkaline Phosphatase: 77 U/L (ref 38–126)
Anion gap: 12 (ref 5–15)
BUN: 16 mg/dL (ref 8–23)
CO2: 26 mmol/L (ref 22–32)
Calcium: 9.1 mg/dL (ref 8.9–10.3)
Chloride: 104 mmol/L (ref 98–111)
Creatinine, Ser: 0.91 mg/dL (ref 0.44–1.00)
GFR, Estimated: >60 mL/min (ref >60)
Glucose, Bld: 119 mg/dL — ABNORMAL HIGH (ref 70–99)
Potassium: 3.5 mmol/L (ref 3.5–5.1)
Sodium: 142 mmol/L (ref 135–145)
Total Bilirubin: 0.5 mg/dL (ref 0.3–1.2)
Total Protein: 7.7 g/dL (ref 6.5–8.1)

## 2020-04-27 LAB — CBC WITH DIFFERENTIAL/PLATELET
Abs Immature Granulocytes: 0.05 10*3/uL (ref 0.00–0.07)
Basophils Absolute: 0 10*3/uL (ref 0.0–0.1)
Basophils Relative: 0 %
Eosinophils Absolute: 0.1 10*3/uL (ref 0.0–0.5)
Eosinophils Relative: 1 %
HCT: 37.3 % (ref 36.0–46.0)
Hemoglobin: 12 g/dL (ref 12.0–15.0)
Immature Granulocytes: 0 %
Lymphocytes Relative: 26 %
Lymphs Abs: 3.2 10*3/uL (ref 0.7–4.0)
MCH: 28.2 pg (ref 26.0–34.0)
MCHC: 32.2 g/dL (ref 30.0–36.0)
MCV: 87.8 fL (ref 80.0–100.0)
Monocytes Absolute: 1.1 10*3/uL — ABNORMAL HIGH (ref 0.1–1.0)
Monocytes Relative: 9 %
Neutro Abs: 7.8 10*3/uL — ABNORMAL HIGH (ref 1.7–7.7)
Neutrophils Relative %: 64 %
Platelets: 407 10*3/uL — ABNORMAL HIGH (ref 150–400)
RBC: 4.25 MIL/uL (ref 3.87–5.11)
RDW: 15.2 % (ref 11.5–15.5)
WBC: 12.3 10*3/uL — ABNORMAL HIGH (ref 4.0–10.5)
nRBC: 0 % (ref 0.0–0.2)

## 2020-04-27 LAB — TECHNOLOGIST SMEAR REVIEW

## 2020-04-27 LAB — C-REACTIVE PROTEIN: CRP: 0.5 mg/dL (ref <1.0)

## 2020-04-27 LAB — LACTATE DEHYDROGENASE: LDH: 166 U/L (ref 98–192)

## 2020-04-27 NOTE — Assessment & Plan Note (Addendum)
#  Thrombocytosis platelets 600 [NOV 2021]- -question essential thrombocytosis versus reactive [clinically less likely]. Patient is asymptomatic.  Long discussion with the patient regarding potential cause of the abnormal blood counts-benign versus malignant.   For now I  recommend checking CBC;CMP jak 2 BCR ABL; MPL; CALR mutation on the peripheral blood.LDH;  Check peripheral smear.  A bone marrow biopsy would be recommended if above work-up is inconclusive or concerning for malignancy.  # Mild anemia-hemoglobin around 10.5 [NOV 2011]-await labs today.  # Hx of right breast cancer/DICS [[facility]] s/p mastectomy- .  Clinically no evidence of recurrence.  # hx of smoking-consider lung cancer screening program if eligible.  #Genetics: Personal history of breast cancer; father with stomach cancer; daughter with breast cancer-will need genetic referral.  Discussed at next visit.  Thank you Dr.Maritni for allowing me to participate in the care of your pleasant patient. Please do not hesitate to contact me with questions or concerns in the interim.  # DISPOSITION: # labs today # with pt's daughter visit- Dr.B

## 2020-04-27 NOTE — Progress Notes (Signed)
[place] NOTE  Patient Care Team: Jonelle Sports, MD as PCP - General (Obstetrics and Gynecology)  CHIEF COMPLAINTS/PURPOSE OF CONSULTATION: Thrombocytosis.   HEMATOLOGY HISTORY  # THROMBOCYTOSIS [NOv 2021 605 -platelets- ; Hb;10.5 white count-WNL];   # hx of breast cancer s/p mastectomy [[facility]]   HISTORY OF PRESENTING ILLNESS:  Gail Stevenson 65 y.o.  female pleasant patient was been referred to [place] for further evaluation of elevated platelets which was incidentally found on blood work.   Of note patient has a prior history of breast cancer-s/p right mastectomy.  Patient states to have previous chemotherapy.  Followed at [facility].   Mammogram left breast fall 2021 -.  Patient denies any history of blood clots or strokes. Denies any burning pain or discoloration in the fingertips or toes.   Appetite is good . No weight loss or night sweats no recurrent fevers. No cough or shortness of breath or chest pain.   Review of Systems  Constitutional: Positive for malaise/fatigue. Negative for chills, diaphoresis, fever and weight loss.  HENT: Negative for nosebleeds and sore throat.   Eyes: Negative for double vision.  Respiratory: Negative for cough, hemoptysis, sputum production, shortness of breath and wheezing.   Cardiovascular: Negative for chest pain, palpitations, orthopnea and leg swelling.  Gastrointestinal: Negative for abdominal pain, blood in stool, constipation, diarrhea, heartburn, melena, nausea and vomiting.  Genitourinary: Negative for dysuria, frequency and urgency.  Musculoskeletal: Positive for back pain and joint pain.  Skin: Negative.  Negative for itching and rash.  Neurological: Negative for dizziness, tingling, focal weakness, weakness and headaches.  Endo/Heme/Allergies: Does not bruise/bleed easily.  Psychiatric/Behavioral: Negative for depression. The patient is not nervous/anxious and does not have insomnia.      MEDICAL HISTORY:   Past Medical History:  Diagnosis Date  . Asthma   . Diabetes mellitus without complication ([place])   . Diastolic dysfunction   . Diverticulitis   . Esophageal varices without bleeding ([place])   . Hemorrhoids   . Hepatitis C   . 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 Hypertension   . Leg swelling   . Leukocytosis   . Osteopenia   . Trigeminal neuralgia     SURGICAL HISTORY: Past Surgical History:  Procedure Laterality Date  . BREAST SURGERY Right 2003   total mastectomy  . COLONOSCOPY  2019  . LAPAROTOMY N/A 01/25/2019   Procedure: EXPLORATORY LAPAROTOMY, ILEOCECECTOMY;  Surgeon: Georganna Skeans, MD;  Location: [facility];  Service: General;  Laterality: N/A;  . MASTECTOMY Right 2003   total  . UPPER GI ENDOSCOPY      SOCIAL HISTORY: Social History   Socioeconomic History  . Marital status: Divorced    Spouse name: Not on file  . Number of children: Not on file  . Years of education: Not on file  . Highest education level: Not on file  Occupational History  . Not on file  Tobacco Use  . Smoking status: Former Smoker    Packs/day: 0.50    Years: 30.00    Pack years: 15.00    Types: Cigarettes    Quit date: 05/17/1992    Years since quitting: 27.9  . Smokeless tobacco: Never Used  Vaping Use  . Vaping Use: Never used  Substance and Sexual Activity  . Alcohol use: Not Currently    Alcohol/week: 0.0 standard drinks    Comment: Quit in 5053; was an alcoholic  . Drug use: No  .  Sexual activity: Yes  Other Topics Concern  . Not on file  Social History Narrative   Lives with daughter in [place]; daughter with breast cancer/ care giver. Hx of smoking; recovering alcohol. Used to work-house keeping/construction.    Social Determinants of Health   Financial Resource Strain: Not on file  Food Insecurity: Not on file  Transportation Needs: Not on file  Physical Activity: Not on file  Stress: Not on file  Social Connections: Not on file   Intimate Partner Violence: Not on file    FAMILY HISTORY: Family History  Problem Relation Age of Onset  . Gastric cancer Father   . Breast cancer Maternal Aunt   . Breast cancer Daughter   . Hypertension Neg Hx   . Stroke Neg Hx     ALLERGIES:  is allergic to metformin, patanol [olopatadine], and paroxetine hcl.  MEDICATIONS:  Current Outpatient Medications  Medication Sig Dispense Refill  . ADVAIR DISKUS 500-50 MCG/DOSE AEPB Inhale 1 puff into the lungs 2 (two) times daily.     Marland Kitchen aspirin EC 81 MG tablet Take 81 mg by mouth daily.    Marland Kitchen atorvastatin (LIPITOR) 40 MG tablet Take 40 mg by mouth at bedtime.     . enalapril (VASOTEC) 20 MG tablet Take 1 tablet (20 mg total) by mouth 2 (two) times daily. 30 tablet 0  . LANTUS SOLOSTAR 100 UNIT/ML Solostar Pen Inject 50 Units into the skin at bedtime.     . montelukast (SINGULAIR) 10 MG tablet Take 10 mg by mouth at bedtime.    . potassium chloride (KLOR-CON) 20 MEQ packet Take 40 mEq by mouth in the morning and at bedtime.    Marland Kitchen PROAIR HFA 108 (90 Base) MCG/ACT inhaler Inhale 2 puffs into the lungs every 6 (six) hours as needed for wheezing or shortness of breath.   5   No current facility-administered medications for this visit.     PHYSICAL EXAMINATION:   Vitals:   04/27/20 1125  BP: (!) 159/69  Pulse: 88  Resp: 20  Temp: 97.6 F (36.4 C)  SpO2: 100%   Filed Weights   04/27/20 1125  Weight: 164 lb 8 oz (74.6 kg)    Physical Exam Constitutional:      Comments: Alone. Ambulating independently.   HENT:     Head: Normocephalic and atraumatic.     Mouth/Throat:     Pharynx: No oropharyngeal exudate.  Eyes:     Pupils: Pupils are equal, round, and reactive to light.  Cardiovascular:     Rate and Rhythm: Normal rate and regular rhythm.  Pulmonary:     Effort: Pulmonary effort is normal. No respiratory distress.     Breath sounds: Normal breath sounds. No wheezing.  Abdominal:     General: Bowel sounds are normal.  There is no distension.     Palpations: Abdomen is soft. There is no mass.     Tenderness: There is no abdominal tenderness. There is no guarding or rebound.  Musculoskeletal:        General: No tenderness. Normal range of motion.     Cervical back: Normal range of motion and neck supple.  Skin:    General: Skin is warm.  Neurological:     Mental Status: She is alert and oriented to person, place, and time.  Psychiatric:        Mood and Affect: Affect normal.      LABORATORY DATA:  I have reviewed the data as listed Lab Results  Component Value Date   WBC 12.3 (H) 04/27/2020   HGB 12.0 04/27/2020   HCT 37.3 04/27/2020   MCV 87.8 04/27/2020   PLT 407 (H) 04/27/2020   Recent Labs    12/14/19 1119 12/14/19 1722 12/15/19 0042 04/27/20 1219  NA 134*  --  136 142  K 3.4*  --  3.7 3.5  CL 94*  --  98 104  CO2 27  --  26 26  GLUCOSE 257*  --  228* 119*  BUN 15  --  12 16  CREATININE 1.02*  --  0.96 0.91  CALCIUM 9.4  --  8.7* 9.1  GFRNONAA >60  --  >60 >60  PROT  --  9.1*  --  7.7  ALBUMIN  --  4.0  --  3.9  AST  --  15  --  23  ALT  --  15  --  17  ALKPHOS  --  88  --  77  BILITOT  --  1.0  --  0.5  BILIDIR  --  0.1  --   --   IBILI  --  0.9  --   --      No results found.  ASSESSMENT & PLAN:   Thrombocytosis #Thrombocytosis platelets 600 [NOV 2021]- -question essential thrombocytosis versus reactive [clinically less likely]. Patient is asymptomatic.  Long discussion with the patient regarding potential cause of the abnormal blood counts-benign versus malignant.   For now I  recommend checking CBC;CMP jak 2 BCR ABL; MPL; CALR mutation on the peripheral blood.LDH;  Check peripheral smear.  A bone marrow biopsy would be recommended if above work-up is inconclusive or concerning for malignancy.  # Mild anemia-hemoglobin around 10.5 [NOV 2011]-await labs today.  # Hx of right breast cancer/DICS [[facility]] s/p mastectomy- .  Clinically no evidence of recurrence.  # hx  of smoking-consider lung cancer screening program if eligible.  #Genetics: Personal history of breast cancer; father with stomach cancer; daughter with breast cancer-will need genetic referral.  Discussed at next visit.  Thank you Dr.Maritni for allowing me to participate in the care of your pleasant patient. Please do not hesitate to contact me with questions or concerns in the interim.  # DISPOSITION: # labs today # with pt's daughter visit- Dr.B       Cammie Sickle, MD 04/27/2020 1:42 PM

## 2020-05-01 LAB — CALRETICULIN (CALR) MUTATION ANALYSIS

## 2020-05-02 LAB — BCR-ABL1 FISH
Cells Analyzed: 200
Cells Counted: 200

## 2020-05-03 LAB — JAK2 GENOTYPR

## 2020-05-09 ENCOUNTER — Other Ambulatory Visit: Payer: Self-pay

## 2020-05-09 ENCOUNTER — Encounter: Payer: Self-pay | Admitting: Internal Medicine

## 2020-05-09 ENCOUNTER — Inpatient Hospital Stay (HOSPITAL_BASED_OUTPATIENT_CLINIC_OR_DEPARTMENT_OTHER): Payer: Medicare Other | Admitting: Internal Medicine

## 2020-05-09 DIAGNOSIS — D75839 Thrombocytosis, unspecified: Secondary | ICD-10-CM | POA: Diagnosis not present

## 2020-05-09 NOTE — Progress Notes (Signed)
[facility] CONSULT NOTE  Patient Care Team: Al Corpus, MD as PCP - General (Obstetrics and Gynecology)  CHIEF COMPLAINTS/PURPOSE OF CONSULTATION: Thrombocytosis.   HEMATOLOGY HISTORY  # THROMBOCYTOSIS [NOv 2021 605 -platelets- ; Hb;10.5 white count-WNL]; MARCH 2022-JAK-2NEG; CALR-NEG.BCR-ABL-NEGATIVE.    # hx of breast cancer s/p mastectomy [[facility]]   HISTORY OF PRESENTING ILLNESS:  Gail Stevenson 65 y.o.  female pleasant prior history of breast cancer is here to discuss the results of her blood work/ordered for thrombocytosis.  Denies any joint pains back pain.  She is concerned if she has cancer.  Review of Systems  Constitutional: Positive for malaise/fatigue. Negative for chills, diaphoresis, fever and weight loss.  HENT: Negative for nosebleeds and sore throat.   Eyes: Negative for double vision.  Respiratory: Negative for cough, hemoptysis, sputum production, shortness of breath and wheezing.   Cardiovascular: Negative for chest pain, palpitations, orthopnea and leg swelling.  Gastrointestinal: Negative for abdominal pain, blood in stool, constipation, diarrhea, heartburn, melena, nausea and vomiting.  Genitourinary: Negative for dysuria, frequency and urgency.  Musculoskeletal: Positive for back pain and joint pain.  Skin: Negative.  Negative for itching and rash.  Neurological: Negative for dizziness, tingling, focal weakness, weakness and headaches.  Endo/Heme/Allergies: Does not bruise/bleed easily.  Psychiatric/Behavioral: Negative for depression. The patient is not nervous/anxious and does not have insomnia.      MEDICAL HISTORY:  Past Medical History:  Diagnosis Date  . Asthma   . Diabetes mellitus without complication ([facility])   . Diastolic dysfunction   . Diverticulitis   . Esophageal varices without bleeding ([facility])   . Hemorrhoids   . Hepatitis C   . 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 Hypertension    . Leg swelling   . Leukocytosis   . Osteopenia   . Trigeminal neuralgia     SURGICAL HISTORY: Past Surgical History:  Procedure Laterality Date  . BREAST SURGERY Right 2003   total mastectomy  . COLONOSCOPY  2019  . LAPAROTOMY N/A 01/25/2019   Procedure: EXPLORATORY LAPAROTOMY, ILEOCECECTOMY;  Surgeon: Violeta Gelinas, MD;  Location: [facility] OR;  Service: General;  Laterality: N/A;  . MASTECTOMY Right 2003   total  . UPPER GI ENDOSCOPY      SOCIAL HISTORY: Social History   Socioeconomic History  . Marital status: Divorced    Spouse name: Not on file  . Number of children: Not on file  . Years of education: Not on file  . Highest education level: Not on file  Occupational History  . Not on file  Tobacco Use  . Smoking status: Former Smoker    Packs/day: 0.50    Years: 30.00    Pack years: 15.00    Types: Cigarettes    Quit date: 05/17/1992    Years since quitting: 27.9  . Smokeless tobacco: Never Used  Vaping Use  . Vaping Use: Never used  Substance and Sexual Activity  . Alcohol use: Not Currently    Alcohol/week: 0.0 standard drinks    Comment: Quit in 1986; was an alcoholic  . Drug use: No  . Sexual activity: Yes  Other Topics Concern  . Not on file  Social History Narrative   Lives with daughter in [place]; daughter with breast cancer/ care giver. Hx of smoking; recovering alcohol. Used to work-house keeping/construction.    Social Determinants of Health   Financial Resource Strain: Not on file  Food Insecurity: Not on file  Transportation  Needs: Not on file  Physical Activity: Not on file  Stress: Not on file  Social Connections: Not on file  Intimate Partner Violence: Not on file    FAMILY HISTORY: Family History  Problem Relation Age of Onset  . Gastric cancer Father   . Breast cancer Maternal Aunt   . Breast cancer Daughter   . Hypertension Neg Hx   . Stroke Neg Hx     ALLERGIES:  is allergic to metformin, patanol [olopatadine], and  paroxetine hcl.  MEDICATIONS:  Current Outpatient Medications  Medication Sig Dispense Refill  . ADVAIR DISKUS 500-50 MCG/DOSE AEPB Inhale 1 puff into the lungs 2 (two) times daily.     Marland Kitchen aspirin EC 81 MG tablet Take 81 mg by mouth daily.    Marland Kitchen atorvastatin (LIPITOR) 40 MG tablet Take 40 mg by mouth at bedtime.     . enalapril (VASOTEC) 20 MG tablet Take 1 tablet (20 mg total) by mouth 2 (two) times daily. 30 tablet 0  . LANTUS SOLOSTAR 100 UNIT/ML Solostar Pen Inject 50 Units into the skin at bedtime.     . montelukast (SINGULAIR) 10 MG tablet Take 10 mg by mouth at bedtime.    . potassium chloride (KLOR-CON) 20 MEQ packet Take 40 mEq by mouth in the morning and at bedtime.    Marland Kitchen PROAIR HFA 108 (90 Base) MCG/ACT inhaler Inhale 2 puffs into the lungs every 6 (six) hours as needed for wheezing or shortness of breath.   5   No current facility-administered medications for this visit.     PHYSICAL EXAMINATION:   Vitals:   05/09/20 0846  BP: (!) 170/84  Pulse: 82  Resp: 16  Temp: 97.9 F (36.6 C)  SpO2: 100%   Filed Weights   05/09/20 0846  Weight: 166 lb (75.3 kg)    Physical Exam Constitutional:      Comments: Accompanied by daughter.  Ambulating independently.   HENT:     Head: Normocephalic and atraumatic.     Mouth/Throat:     Pharynx: No oropharyngeal exudate.  Eyes:     Pupils: Pupils are equal, round, and reactive to light.  Cardiovascular:     Rate and Rhythm: Normal rate and regular rhythm.  Pulmonary:     Effort: Pulmonary effort is normal. No respiratory distress.     Breath sounds: Normal breath sounds. No wheezing.  Abdominal:     General: Bowel sounds are normal. There is no distension.     Palpations: Abdomen is soft. There is no mass.     Tenderness: There is no abdominal tenderness. There is no guarding or rebound.  Musculoskeletal:        General: No tenderness. Normal range of motion.     Cervical back: Normal range of motion and neck supple.   Skin:    General: Skin is warm.  Neurological:     Mental Status: She is alert and oriented to person, place, and time.  Psychiatric:        Mood and Affect: Affect normal.      LABORATORY DATA:  I have reviewed the data as listed Lab Results  Component Value Date   WBC 12.3 (H) 04/27/2020   HGB 12.0 04/27/2020   HCT 37.3 04/27/2020   MCV 87.8 04/27/2020   PLT 407 (H) 04/27/2020   Recent Labs    12/14/19 1119 12/14/19 1722 12/15/19 0042 04/27/20 1219  NA 134*  --  136 142  K 3.4*  --  3.7 3.5  CL 94*  --  98 104  CO2 27  --  26 26  GLUCOSE 257*  --  228* 119*  BUN 15  --  12 16  CREATININE 1.02*  --  0.96 0.91  CALCIUM 9.4  --  8.7* 9.1  GFRNONAA >60  --  >60 >60  PROT  --  9.1*  --  7.7  ALBUMIN  --  4.0  --  3.9  AST  --  15  --  23  ALT  --  15  --  17  ALKPHOS  --  88  --  77  BILITOT  --  1.0  --  0.5  BILIDIR  --  0.1  --   --   IBILI  --  0.9  --   --      No results found.  ASSESSMENT & PLAN:   Thrombocytosis #Thrombocytosis platelets 600 [NOV 2021]; repeat platelet count 407 [normal 400]-suspect reactive rather than primary bone marrow process.  JAK2; CAL mutation negative.  I would recommend for now taking aspirin once a day.  Hold off a bone marrow biopsy.  # Hx of right breast cancer/DICS [[facility]] s/p mastectomy- .  Clinically no evidence of recurrence.  # hx of smoking-consider lung cancer screening program if eligible.  #Genetics: Personal history of breast cancer; father with stomach cancer; daughter with breast cancer-will need genetic referral. Discuss at next visit.   # DISPOSITION: # follow up in 3 months- MD; labs- cbc/cmp- Dr.B       Cammie Sickle, MD 05/09/2020 4:18 PM

## 2020-05-09 NOTE — Assessment & Plan Note (Addendum)
#  Thrombocytosis platelets 600 [NOV 2021]; repeat platelet count 407 [normal 400]-suspect reactive rather than primary bone marrow process.  JAK2; CAL mutation negative.  I would recommend for now taking aspirin once a day.  Hold off a bone marrow biopsy.  # Hx of right breast cancer/DICS [[facility]] s/p mastectomy- .  Clinically no evidence of recurrence.  # hx of smoking-consider lung cancer screening program if eligible.  #Genetics: Personal history of breast cancer; father with stomach cancer; daughter with breast cancer-will need genetic referral. Discuss at next visit.   # DISPOSITION: # follow up in 3 months- MD; labs- cbc/cmp- Dr.B

## 2020-05-18 ENCOUNTER — Telehealth: Payer: Self-pay | Admitting: Internal Medicine

## 2020-05-18 NOTE — Telephone Encounter (Signed)
Patient approached the scheduling desk and request ooi have her June 2022 appointments canceled. She stated she was not doing anything the doctor said and wasn't coming back. Was witnessed by [place]. Appt have been canceled.

## 2020-06-27 ENCOUNTER — Other Ambulatory Visit: Payer: Self-pay

## 2020-06-27 ENCOUNTER — Ambulatory Visit: Payer: Self-pay | Admitting: Advanced Practice Midwife

## 2020-06-27 DIAGNOSIS — Z113 Encounter for screening for infections with a predominantly sexual mode of transmission: Secondary | ICD-10-CM

## 2020-07-26 ENCOUNTER — Other Ambulatory Visit: Payer: Self-pay | Admitting: Obstetrics and Gynecology

## 2020-07-26 ENCOUNTER — Inpatient Hospital Stay
Admission: RE | Admit: 2020-07-26 | Discharge: 2020-07-26 | Disposition: A | Discharge status: Home / Self Care | Payer: Self-pay | Source: Ambulatory Visit | Attending: *Deleted | Admitting: *Deleted

## 2020-07-26 ENCOUNTER — Other Ambulatory Visit: Payer: Self-pay | Admitting: *Deleted

## 2020-07-26 DIAGNOSIS — Z1231 Encounter for screening mammogram for malignant neoplasm of breast: Secondary | ICD-10-CM

## 2020-07-26 DIAGNOSIS — N644 Mastodynia: Secondary | ICD-10-CM

## 2020-08-09 ENCOUNTER — Other Ambulatory Visit: Payer: Medicare Other

## 2020-08-09 ENCOUNTER — Ambulatory Visit: Payer: Medicare Other | Admitting: Internal Medicine

## 2020-08-17 ENCOUNTER — Other Ambulatory Visit: Payer: Self-pay | Admitting: Obstetrics and Gynecology

## 2020-08-17 DIAGNOSIS — N644 Mastodynia: Secondary | ICD-10-CM

## 2020-08-19 ENCOUNTER — Other Ambulatory Visit: Payer: Self-pay

## 2020-08-19 ENCOUNTER — Ambulatory Visit
Admission: RE | Admit: 2020-08-19 | Discharge: 2020-08-19 | Disposition: A | Discharge status: Home / Self Care | Payer: Medicare Other | Source: Ambulatory Visit | Attending: *Deleted | Admitting: *Deleted

## 2020-08-19 ENCOUNTER — Ambulatory Visit
Admission: RE | Admit: 2020-08-19 | Discharge: 2020-08-19 | Disposition: A | Discharge status: Home / Self Care | Payer: Medicare Other | Source: Ambulatory Visit | Attending: Obstetrics and Gynecology | Admitting: Obstetrics and Gynecology

## 2020-08-19 DIAGNOSIS — N6042 Mammary duct ectasia of left breast: Secondary | ICD-10-CM | POA: Insufficient documentation

## 2020-08-19 DIAGNOSIS — N644 Mastodynia: Secondary | ICD-10-CM

## 2020-08-19 DIAGNOSIS — Z1231 Encounter for screening mammogram for malignant neoplasm of breast: Secondary | ICD-10-CM | POA: Insufficient documentation

## 2020-08-19 HISTORY — DX: Malignant neoplasm of unspecified site of unspecified female breast: C50.919

## 2020-08-19 HISTORY — DX: Personal history of antineoplastic chemotherapy: Z92.21

## 2020-08-22 ENCOUNTER — Other Ambulatory Visit: Payer: Self-pay | Admitting: Obstetrics and Gynecology

## 2020-08-30 ENCOUNTER — Other Ambulatory Visit: Payer: Self-pay | Admitting: *Deleted

## 2020-08-30 DIAGNOSIS — N6042 Mammary duct ectasia of left breast: Secondary | ICD-10-CM

## 2021-02-01 ENCOUNTER — Encounter: Payer: Self-pay | Admitting: Emergency Medicine

## 2021-02-01 ENCOUNTER — Emergency Department: Payer: Medicare Other

## 2021-02-01 ENCOUNTER — Emergency Department
Admission: EM | Admit: 2021-02-01 | Discharge: 2021-02-01 | Disposition: A | Discharge status: Home / Self Care | Payer: Medicare Other | Attending: Emergency Medicine | Admitting: Emergency Medicine

## 2021-02-01 ENCOUNTER — Other Ambulatory Visit: Payer: Self-pay

## 2021-02-01 DIAGNOSIS — R55 Syncope and collapse: Secondary | ICD-10-CM | POA: Diagnosis not present

## 2021-02-01 DIAGNOSIS — R7309 Other abnormal glucose: Secondary | ICD-10-CM | POA: Diagnosis not present

## 2021-02-01 DIAGNOSIS — Z5321 Procedure and treatment not carried out due to patient leaving prior to being seen by health care provider: Secondary | ICD-10-CM | POA: Insufficient documentation

## 2021-02-01 DIAGNOSIS — Z20822 Contact with and (suspected) exposure to covid-19: Secondary | ICD-10-CM | POA: Diagnosis not present

## 2021-02-01 LAB — COMPREHENSIVE METABOLIC PANEL
ALT: 21 U/L (ref 0–44)
AST: 20 U/L (ref 15–41)
Albumin: 3.8 g/dL (ref 3.5–5.0)
Alkaline Phosphatase: 81 U/L (ref 38–126)
Anion gap: 5 (ref 5–15)
BUN: 14 mg/dL (ref 8–23)
CO2: 28 mmol/L (ref 22–32)
Calcium: 8.7 mg/dL — ABNORMAL LOW (ref 8.9–10.3)
Chloride: 105 mmol/L (ref 98–111)
Creatinine, Ser: 0.85 mg/dL (ref 0.44–1.00)
GFR, Estimated: >60 mL/min (ref >60)
Glucose, Bld: 123 mg/dL — ABNORMAL HIGH (ref 70–99)
Potassium: 3.3 mmol/L — ABNORMAL LOW (ref 3.5–5.1)
Sodium: 138 mmol/L (ref 135–145)
Total Bilirubin: 0.7 mg/dL (ref 0.3–1.2)
Total Protein: 7.6 g/dL (ref 6.5–8.1)

## 2021-02-01 LAB — CBC WITH DIFFERENTIAL/PLATELET
Abs Immature Granulocytes: 0.03 10*3/uL (ref 0.00–0.07)
Basophils Absolute: 0 10*3/uL (ref 0.0–0.1)
Basophils Relative: 0 %
Eosinophils Absolute: 0.1 10*3/uL (ref 0.0–0.5)
Eosinophils Relative: 1 %
HCT: 39.5 % (ref 36.0–46.0)
Hemoglobin: 12.6 g/dL (ref 12.0–15.0)
Immature Granulocytes: 0 %
Lymphocytes Relative: 34 %
Lymphs Abs: 3.6 10*3/uL (ref 0.7–4.0)
MCH: 29.5 pg (ref 26.0–34.0)
MCHC: 31.9 g/dL (ref 30.0–36.0)
MCV: 92.5 fL (ref 80.0–100.0)
Monocytes Absolute: 1.1 10*3/uL — ABNORMAL HIGH (ref 0.1–1.0)
Monocytes Relative: 10 %
Neutro Abs: 5.7 10*3/uL (ref 1.7–7.7)
Neutrophils Relative %: 55 %
Platelets: 380 10*3/uL (ref 150–400)
RBC: 4.27 MIL/uL (ref 3.87–5.11)
RDW: 12.5 % (ref 11.5–15.5)
WBC: 10.6 10*3/uL — ABNORMAL HIGH (ref 4.0–10.5)
nRBC: 0 % (ref 0.0–0.2)

## 2021-02-01 LAB — RESP PANEL BY RT-PCR (FLU A&B, COVID) ARPGX2
Influenza A by PCR: NEGATIVE
Influenza B by PCR: NEGATIVE
SARS Coronavirus 2 by RT PCR: NEGATIVE

## 2021-02-01 LAB — CBG MONITORING, ED: Glucose-Capillary: 123 mg/dL — ABNORMAL HIGH (ref 70–99)

## 2021-02-01 LAB — TROPONIN I (HIGH SENSITIVITY): Troponin I (High Sensitivity): 14 ng/L (ref <18)

## 2021-02-01 NOTE — ED Provider Notes (Signed)
°  Emergency Medicine Provider Triage Evaluation Note  Gail Stevenson , a 65 y.o.female,  was evaluated in triage.  Pt complains of weakness.  Patient was sitting with her ex-husband and was walking by when she was visualized staggering across the hall.  She states that she felt a warm flush across her body and she felt her vision going dark.  She states that she is a diabetic.  Began to improve after some soda and crackers.  Endorse some mild chest pain.  Currently asymptomatic at this time.   Review of Systems  Positive: Weakness, chest pain. Negative: Denies fever, chest pain, vomiting  Physical Exam   Vitals:   02/01/21 1620  BP: (!) 180/92  Pulse: 83  Resp: 18  Temp: 98 F (36.7 C)  SpO2: 100%   Gen:   Awake, no distress   Resp:  Normal effort  MSK:   Moves extremities without difficulty  Other:    Medical Decision Making  Given the patient's initial medical screening exam, the following diagnostic evaluation has been ordered. The patient will be placed in the appropriate treatment space, once one is available, to complete the evaluation and treatment. I have discussed the plan of care with the patient and I have advised the patient that an ED physician or mid-level practitioner will reevaluate their condition after the test results have been received, as the results may give them additional insight into the type of treatment they may need.    Diagnostics: Labs, respiratory panel, EKG, CXR.  Treatments: none immediately   Teodoro Spray, [place] 02/01/21 1638    Rada Hay, MD 02/01/21 2039

## 2021-02-01 NOTE — ED Triage Notes (Signed)
Pt was sitting with her ex husband and was walking in the hallway and seen staggering, she is diabetic we did give her some soda and crackers. She did not remember almost passing out.

## 2021-02-17 ENCOUNTER — Other Ambulatory Visit: Payer: Self-pay

## 2021-02-17 ENCOUNTER — Ambulatory Visit
Admission: RE | Admit: 2021-02-17 | Discharge: 2021-02-17 | Disposition: A | Discharge status: Home / Self Care | Payer: Medicare Other | Source: Ambulatory Visit | Attending: Obstetrics and Gynecology | Admitting: Obstetrics and Gynecology

## 2021-02-17 DIAGNOSIS — N6342 Unspecified lump in left breast, subareolar: Secondary | ICD-10-CM | POA: Insufficient documentation

## 2021-02-17 DIAGNOSIS — N6042 Mammary duct ectasia of left breast: Secondary | ICD-10-CM | POA: Diagnosis not present

## 2021-02-20 ENCOUNTER — Other Ambulatory Visit: Payer: Self-pay | Admitting: Obstetrics and Gynecology

## 2021-03-01 ENCOUNTER — Other Ambulatory Visit: Payer: Self-pay | Admitting: Obstetrics and Gynecology

## 2021-03-01 DIAGNOSIS — N6320 Unspecified lump in the left breast, unspecified quadrant: Secondary | ICD-10-CM

## 2021-03-01 DIAGNOSIS — R928 Other abnormal and inconclusive findings on diagnostic imaging of breast: Secondary | ICD-10-CM

## 2021-03-14 ENCOUNTER — Ambulatory Visit
Admission: RE | Admit: 2021-03-14 | Discharge: 2021-03-14 | Disposition: A | Discharge status: Home / Self Care | Payer: Medicare Other | Source: Ambulatory Visit | Attending: Obstetrics and Gynecology | Admitting: Obstetrics and Gynecology

## 2021-03-14 ENCOUNTER — Other Ambulatory Visit: Payer: Self-pay

## 2021-03-14 ENCOUNTER — Ambulatory Visit
Admission: RE | Admit: 2021-03-14 | Discharge: 2021-03-14 | Disposition: A | Discharge status: Home / Self Care | Payer: Medicare Other | Source: Ambulatory Visit | Attending: *Deleted | Admitting: *Deleted

## 2021-03-14 DIAGNOSIS — N6320 Unspecified lump in the left breast, unspecified quadrant: Secondary | ICD-10-CM | POA: Insufficient documentation

## 2021-03-14 DIAGNOSIS — R928 Other abnormal and inconclusive findings on diagnostic imaging of breast: Secondary | ICD-10-CM

## 2021-03-14 HISTORY — PX: BREAST BIOPSY: SHX20

## 2021-03-15 LAB — SURGICAL PATHOLOGY

## 2021-03-31 ENCOUNTER — Telehealth: Payer: Self-pay

## 2021-03-31 ENCOUNTER — Ambulatory Visit (INDEPENDENT_AMBULATORY_CARE_PROVIDER_SITE_OTHER): Payer: Medicare HMO | Admitting: Surgery

## 2021-03-31 ENCOUNTER — Other Ambulatory Visit: Payer: Self-pay

## 2021-03-31 ENCOUNTER — Encounter: Payer: Self-pay | Admitting: Surgery

## 2021-03-31 VITALS — BP 163/72 | HR 91 | Temp 97.9°F | Ht 62.0 in | Wt 192.8 lb

## 2021-03-31 DIAGNOSIS — N6342 Unspecified lump in left breast, subareolar: Secondary | ICD-10-CM

## 2021-03-31 NOTE — Progress Notes (Signed)
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  The patient has a prior history of right breast cancer and had a mastectomy and sentinel lymph node biopsy in 2003.  She also has family history of breast cancer.  The patient a screening mammogram on 07/26/20 and had a diagnostic left mammogram with ultrasound on 08/19/20.  These showed benign duct ectasia with debris in the left retroareolar space, 9 o'clock position.  This was then followed up with subsequent left breast ultrasound on 02/17/21.  This showed now instead a possible intraductal mass in the left retroareolar space at 9 o'clock position, and biopsy was done on 03/14/21.  This revealed ADH vs DCIS and excision has been recommended.  Currently she reports having pain at the biopsy site and area of firmness due to the biopsy.  Prior to the biopsy, she denied feeling any masses, skin changes, or nipple changes in that area.  The patient has family history and personal history of breast cancer.  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 Authorizing Provider  ADVAIR DISKUS 500-50 MCG/DOSE AEPB Inhale 1 puff into the lungs 2 (two) times daily.    Yes [provider]  aspirin EC 81 MG tablet Take 81 mg by mouth daily.   Yes [provider]  atorvastatin (LIPITOR) 40 MG tablet Take 40 mg by mouth at bedtime.    Yes [provider]  enalapril (VASOTEC) 20 MG tablet Take 1 tablet (20 mg total) by mouth 2 (two) times daily. 12/16/19  Yes Lorella Nimrod, MD  LANTUS SOLOSTAR 100 UNIT/ML Solostar Pen Inject 50 Units into the skin at bedtime.    Yes [provider]  montelukast (SINGULAIR) 10 MG tablet Take 10 mg by mouth at bedtime. 12/26/18  Yes [provider]  potassium chloride (KLOR-CON) 20 MEQ packet Take 40 mEq by mouth in the morning and at bedtime.   Yes [provider]  PROAIR HFA 108 (90 Base) MCG/ACT inhaler Inhale 2 puffs into the lungs every 6 (six) hours as needed for wheezing or shortness of breath.    Yes [provider]    Allergies: Allergies  Allergen Reactions   Metformin Other (See Comments) and Swelling    Unable to walk   Patanol [Olopatadine]    Paroxetine Hcl Rash    Social History:  reports that she quit smoking about 28 years ago. Her smoking use included cigarettes. She has a 15.00 pack-year smoking history. She has never used smokeless tobacco. She reports that she does not currently use alcohol. She reports that she does not use drugs.   Family History: Family History  Problem Relation Age of Onset   Gastric cancer Father    Breast cancer Maternal Aunt    Breast cancer Daughter    Hypertension Neg Hx    Stroke Neg Hx      Review of Systems: Review of Systems  Constitutional:  Negative for chills and fever.  HENT:  Negative for hearing loss.   Respiratory:  Negative for shortness of breath.   Cardiovascular:  Negative for chest pain.  Gastrointestinal:  Negative for abdominal pain, nausea and vomiting.  Genitourinary:  Negative for dysuria.  Musculoskeletal:  Negative for myalgias.  Skin:  Negative for rash.       Breast pain at the biopsy site.  Neurological:  Negative for dizziness.  Psychiatric/Behavioral:  Negative for depression.    Physical Exam BP (!) 163/72    Pulse 91    Temp 97.9 F (36.6 C) (Oral)    Ht 5\' 2"  (1.575 m)    Wt 192 lb 12.8 oz (87.5 kg)    SpO2 98%    BMI 35.26 kg/m  CONSTITUTIONAL: No acute distress, well-nourished HEENT:  Normocephalic, atraumatic, extraocular motion intact. NECK: Trachea is midline, and there is no jugular venous distension.  RESPIRATORY:  Lungs are clear, and breath sounds are equal bilaterally. Normal respiratory effort without pathologic use of accessory muscles. CARDIOVASCULAR: Heart is regular without murmurs, gallops, or rubs. BREAST: Right breast status post mastectomy and sentinel lymph node biopsy with well-healed scar.  No evidence of any masses in the chest wall or in the axilla to suggest recurrence.  Left breast status post biopsy via lateral approach with some resulting ecchymosis as well as firmness in the retroareolar space likely due to hematoma or inflammation.  Otherwise no palpable masses, skin changes, or other nipple changes.  No left axillary lymphadenopathy. MUSCULOSKELETAL:  Normal muscle strength and tone in all four extremities.  No peripheral edema or cyanosis. SKIN: Skin turgor is normal. There are no pathologic skin lesions.  NEUROLOGIC:  Motor and sensation is grossly normal.  Cranial nerves are grossly intact. PSYCH:  Alert and oriented to person, place and time. Affect is normal.  Laboratory Analysis: Left breast biopsy on  03/14/2021: DIAGNOSIS:  A. BREAST, LEFT RETROAREOLAR 9:00; ULTRASOUND-GUIDED BIOPSY:  - FRAGMENTS OF PAPILLARY EPITHELIAL PROLIFERATION WITH ATYPIA.  - VASCULAR CALCIFICATIONS.   Comment:  The differential diagnosis includes an intraductal papilloma with  atypical ductal hyperplasia (ADH) or ductal carcinoma in situ (DCIS).  An excision is recommended for definitive classification.   Imaging: Left breast mammogram and ultrasound on 08/19/2020: FINDINGS: Full field CC and MLO views were obtained.   Circumscribed isodense mass in the retroareolar location at anterior measuring approximately 1 cm without associated architectural distortion or suspicious calcifications. No suspicious findings elsewhere.   Targeted ultrasound is performed, demonstrating duct ectasia in the subareolar location. Within 1 of the mildly dilated ducts at the 9 o'clock subareolar location is hypoechoic material spanning approximately 7 mm without evidence of internal  color Doppler flow. A focally dilated duct extending over an approximate 1 cm length at the 9 o'clock subareolar location accounts for the screening mammographic finding.   IMPRESSION: Benign duct ectasia with what is likely hypoechoic debris within a mildly dilated duct at the 9 o'clock subareolar location (given the absence of internal blood flow).   RECOMMENDATION: LEFT breast ultrasound in 6 months.   I have discussed the findings and recommendations with the patient. If applicable, a reminder letter will be sent to the patient regarding the next appointment.   BI-RADS CATEGORY  3: Probably benign.  Ultrasound left breast on 02/17/2021: FINDINGS: Targeted ultrasound is performed, showing a persistent mass in the left breast at 9 o'clock in the retroareolar region measuring 3 x 4 x 3 mm today versus 4 x 5 x 7 mm previously. The difference in size may be technical. The mass was previously thought to be intraductal.    IMPRESSION: Possible intraductal mass in the left retroareolar region at 9 o'clock.   RECOMMENDATION: Recommend ultrasound-guided biopsy of the left retroareolar 9 o'clock mass.   I have discussed the findings and recommendations with the patient. If applicable, a reminder letter will be sent to the patient regarding the next appointment.   BI-RADS CATEGORY  4: Suspicious.  Assessment and Plan: This is a 66 y.o. 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  - Discussed with the patient her imaging findings as well as biopsy results and what these mean.  Discussed with her the reasoning behind excision of this area in order to get a full evaluation as this could alter her management going forwards.  Discussed with her that this would entail a lumpectomy and does not require a full mastectomy as she had on the right breast in 2003.  Reviewed with her the role for radiofrequency tag localization prior to surgery and discussed with her the surgery at length including the risks of bleeding, infection, injury to surrounding structures, postoperative care, pain control, that this is an outpatient procedure, and she is willing to proceed. -We will send for medical clearance.  We will also have patient stop her aspirin with last dose on 04/07/2021.  We will tentatively schedule her for surgery on 04/13/2021, pending that the RF tag placement can be done prior to that. -All questions have been answered and patient understands this plan.  I spent 80 minutes dedicated to the care of this patient on the date of this encounter to include pre-visit review of records, face-to-face time with the patient discussing diagnosis and management, and any post-visit coordination of care.   Melvyn Neth, [facility]

## 2021-03-31 NOTE — Patient Instructions (Signed)
Our surgery scheduler Pamala Hurry will call you within 24-48 hours to get you scheduled. If you have not heard from her after 48 hours, please call our office. You will not need to get Covid tested before surgery and have the blue sheet available when she calls to write down important information.  If you have any concerns or questions, please feel free to call our office.   Lumpectomy A lumpectomy, sometimes called a partial mastectomy, is surgery to remove a cancerous tumor or mass (the lump) from a breast. It is a form of breast-conserving or breast-preservation surgery. This means that the cancerous tissue is removed but the breast remains intact. During a lumpectomy, the portion of the breast that contains the tumor is removed. Some normal tissue around the lump may be taken out to make sure that all of the tumor has been removed. Lymph nodes under your arm may also be removed and tested to find out if the cancer has spread. Lymph nodes are part of the body's disease-fighting system (immune system) and are usually the first place where breast cancer spreads. Tell a health care provider about: Any allergies you have. All medicines you are taking, including vitamins, herbs, eye drops, creams, and over-the-counter medicines. Any problems you or family members have had with anesthetic medicines. Any blood disorders you have. Any surgeries you have had. Any medical conditions you have. Whether you are pregnant or may be pregnant. What are the risks? Generally, this is a safe procedure. However, problems may occur, including: Bleeding. Infection. Allergic reaction to medicines. Pain, swelling, weakness, or numbness in the arm on the side of your surgery. Temporary swelling. Change in the shape of the breast, particularly if a large portion is removed. Scar tissue that forms at the surgical site and feels hard to the touch. Blood clots. What happens before the procedure? Staying hydrated Follow  instructions from your health care provider about hydration, which may include: Up to 2 hours before the procedure - you may continue to drink clear liquids, such as water, clear fruit juice, black coffee, and plain tea.  Eating and drinking restrictions Follow instructions from your health care provider about eating and drinking, which may include: 8 hours before the procedure - stop eating heavy meals or foods, such as meat, fried foods, or fatty foods. 6 hours before the procedure - stop eating light meals or foods, such as toast or cereal. 6 hours before the procedure - stop drinking milk or drinks that contain milk. 2 hours before the procedure - stop drinking clear liquids. Medicines Ask your health care provider about: Changing or stopping your regular medicines. This is especially important if you are taking diabetes medicines or blood thinners. Taking medicines such as aspirin and ibuprofen. These medicines can thin your blood. Do not take these medicines unless your health care provider tells you to take them. Taking over-the-counter medicines, vitamins, herbs, and supplements. General instructions Prior to surgery, your health care provider may do a procedure to locate and mark the tumor area in your breast (localization). This will help guide your surgeon to where the incision will be made. This may be done with: Imaging, such as a mammogram, ultrasound, or MRI. Insertion of a small wire, clip, or seed, or an implant that will reflect a radar signal. You may have screening tests or exams to get baseline measurements of your arm. These can be compared to measurements done after surgery to monitor for swelling (lymphedema) that can develop after having  lymph nodes removed. Ask your health care provider: How your surgery site will be marked. What steps will be taken to help prevent infection. These may include: Washing skin with a germ-killing soap. Taking antibiotic medicine. Plan  to have someone take you home from the hospital or clinic. Plan to have a responsible adult care for you for at least 24 hours after you leave the hospital or clinic. This is important. What happens during the procedure?  An IV will be inserted into one of your veins. You will be given one or more of the following: A medicine to help you relax (sedative). A medicine to numb the area (local anesthetic). A medicine to make you fall asleep (general anesthetic). Your health care provider will use a kind of electric scalpel that uses heat to reduce bleeding (electrocautery knife). A curved incision that follows the natural curve of your breast will be made. This type of incision will allow for minimal scarring and better healing. The tumor will be removed along with some of the tissue around it. This will be sent to the lab for testing. Your health care provider may also remove lymph nodes at this time if needed. If the tumor is close to the muscles over your chest, some muscle tissue may also be removed. A small drain tube may be inserted into your breast area or armpit to collect fluid that may build up after surgery. This tube will be connected to a suction bulb on the outside of your body to remove the fluid. The incision will be closed with stitches (sutures). A bandage (dressing) may be placed over the incision. The procedure may vary among health care providers and hospitals. What happens after the procedure? Your blood pressure, heart rate, breathing rate, and blood oxygen level will be monitored until you leave the hospital or clinic. You will be given medicine for pain as needed. Your IV will be removed when you are able to eat and drink by mouth. You will be encouraged to get up and walk as soon as you can. This is important to improve blood flow and breathing. Ask for help if you feel weak or unsteady. You may have: A drain tube in place for 2-3 days to prevent a collection of blood  (hematoma) from developing in the breast. You will be given instructions about caring for the drain before you go home. A pressure bandage applied for 1-2 days to prevent bleeding or swelling. Your pressure bandage may look like a thick piece of fabric or an elastic wrap. Ask your health care provider how to care for your bandage at home. You may be given a tight sleeve to wear over your arm on the side of your surgery. You should wear this sleeve as told by your health care provider. Do not drive for 24 hours if you were given a sedative during your procedure. Summary A lumpectomy, sometimes called a partial mastectomy, is surgery to remove a cancerous tumor or mass (the lump) from a breast. During a lumpectomy, the portion of the breast that contains the tumor is removed. Lymph nodes under your arm may also be removed and tested to find out if the cancer has spread. Plan to have someone take you home from the hospital or clinic. You may have a drain tube in place for 2-3 days to prevent a collection of blood (hematoma) from developing in the breast. You will be given instructions about caring for the drain before you go home. This  information is not intended to replace advice given to you by your health care provider. Make sure you discuss any questions you have with your health care provider. Document Revised: 08/04/2018 Document Reviewed: 08/04/2018 Elsevier Patient Education  [place].

## 2021-03-31 NOTE — Telephone Encounter (Signed)
Faxed medical clearance to Dr. Leticia Penna at [phone].

## 2021-04-01 ENCOUNTER — Encounter: Payer: Self-pay | Admitting: Surgery

## 2021-04-03 ENCOUNTER — Other Ambulatory Visit: Payer: Self-pay | Admitting: Surgery

## 2021-04-03 DIAGNOSIS — Z853 Personal history of malignant neoplasm of breast: Secondary | ICD-10-CM

## 2021-04-03 DIAGNOSIS — N6320 Unspecified lump in the left breast, unspecified quadrant: Secondary | ICD-10-CM

## 2021-04-04 ENCOUNTER — Telehealth: Payer: Self-pay | Admitting: Surgery

## 2021-04-04 NOTE — Telephone Encounter (Signed)
Addendum to the following surgery information:  Per Dr. Hampton Abbot patient to stop aspirin prior to surgery with her last dose of aspirin to be on 04/07/21.  Patient verbalized understanding.

## 2021-04-04 NOTE — Telephone Encounter (Signed)
Patient has been advised of Pre-Admission date/time, COVID Testing date and Surgery date.  Surgery Date: 04/13/21 Preadmission Testing Date: 04/05/21 (phone 8a-1p) Covid Testing Date: Not needed.     Patient has been made aware to call [phone], between 1-3:00pm the day before surgery, to find out what time to arrive for surgery.    Also patient reminded of her RF tag to be placed at the [facility] on 04/06/21 with a 3:30 arrival for 3:40 appt.   Patient verbalized understanding with the above.

## 2021-04-05 ENCOUNTER — Encounter
Admission: RE | Admit: 2021-04-05 | Discharge: 2021-04-05 | Disposition: A | Discharge status: Home / Self Care | Payer: Medicare HMO | Source: Ambulatory Visit | Attending: Surgery | Admitting: Surgery

## 2021-04-05 ENCOUNTER — Other Ambulatory Visit: Payer: Self-pay

## 2021-04-05 NOTE — Patient Instructions (Signed)
Your procedure is scheduled on: 04/13/2021 Report to the Registration Desk on the 1st floor of the [facility]. To find out your arrival time, please call [phone] between 1PM - 3PM on: 04/12/2021  REMEMBER: Instructions that are not followed completely may result in serious medical risk, up to and including death; or upon the discretion of your surgeon and anesthesiologist your surgery may need to be rescheduled.  Do not eat food after midnight the night before surgery.  No gum chewing, lozengers or hard candies.  You may however, drink water up to 2 hours before you are scheduled to arrive for your surgery. Do not drink anything within 2 hours of your scheduled arrival time.  TAKE THESE MEDICATIONS THE MORNING OF SURGERY WITH A SIP OF WATER: - carvedilol (COREG) - omeprazole (PRILOSEC) (take one the night before and one on the morning of surgery - helps to prevent nausea after surgery.)  ** LANTUS SOLOSTAR- take  the normal dose of insulin the night prior to surgery. Prescribed 56 units per night. You may take 28 units the night prior to surgery.   Use inhalers on the day of surgery and bring to the hospital.  Follow recommendations from Cardiologist, Pulmonologist or PCP regarding stopping Aspirin, Coumadin, Plavix, Eliquis, Pradaxa, or Pletal.  One week prior to surgery: Stop Anti-inflammatories (NSAIDS) such as Advil, Aleve, Ibuprofen, Motrin, Naproxen, Naprosyn and Aspirin based products such as Excedrin, Goodys Powder, BC Powder. You may however, continue to take Tylenol if needed for pain up until the day of surgery.  Stop any over the counter vitamins and supplements 7 days prior to your surgery date (multivitamin).    No Alcohol for 24 hours before or after surgery.  No Smoking including e-cigarettes for 24 hours prior to surgery.  No chewable tobacco products for at least 6 hours prior to surgery.  No nicotine patches on the day of surgery.  Do not use any  "recreational" drugs for at least a week prior to your surgery.  Please be advised that the combination of cocaine and anesthesia may have negative outcomes, up to and including death. If you test positive for cocaine, your surgery will be cancelled.  On the morning of surgery brush your teeth with toothpaste and water, you may rinse your mouth with mouthwash if you wish. Do not swallow any toothpaste or mouthwash.  Use CHG Soap or wipes as directed on instruction sheet.  Do not wear jewelry, make-up, hairpins, clips or nail polish.  Do not wear lotions, powders, or perfumes.   Do not shave body from the neck down 48 hours prior to surgery just in case you cut yourself which could leave a site for infection.  Also, freshly shaved skin may become irritated if using the CHG soap.  Do not bring valuables to the hospital. [facility] is not responsible for any missing/lost belongings or valuables.   Notify your doctor if there is any change in your medical condition (cold, fever, infection).  Wear comfortable clothing (specific to your surgery type) to the hospital.  If you are being discharged the day of surgery, you will not be allowed to drive home. You will need a responsible adult (18 years or older) to drive you home and stay with you that night.   If you are taking public transportation, you will need to have a responsible adult (18 years or older) with you. Please confirm with your physician that it is acceptable to use public transportation.   Please  call the [facility] at [phone] if you have any questions about these instructions.  Surgery Visitation Policy:  Patients undergoing a surgery or procedure may have one family member or support person in the surgical waiting area with them as long as that person is not COVID-19 positive or experiencing its symptoms.  The patient is not allowed to have any visitors in the pre-operative area. Visitors must  remain in the surgical waiting room.   Inpatient Visitation:    Visiting hours are 7 a.m. to 8 p.m. Up to two visitors ages 16+ are allowed at one time in a patient room. The visitors may rotate out with other people during the day. Visitors must check out when they leave, or other visitors will not be allowed. One designated support person may remain overnight. The visitor must pass COVID-19 screenings, use hand sanitizer when entering and exiting the patients room and wear a mask at all times, including in the patients room. Patients must also wear a mask when staff or their visitor are in the room. Masking is required regardless of vaccination status.

## 2021-04-06 ENCOUNTER — Other Ambulatory Visit: Payer: Self-pay | Admitting: Surgery

## 2021-04-06 ENCOUNTER — Ambulatory Visit: Admission: RE | Admit: 2021-04-06 | Payer: Medicaid Other | Source: Ambulatory Visit

## 2021-04-06 ENCOUNTER — Ambulatory Visit
Admission: RE | Admit: 2021-04-06 | Discharge: 2021-04-06 | Disposition: A | Discharge status: Home / Self Care | Payer: Medicare HMO | Source: Ambulatory Visit | Attending: Surgery | Admitting: Surgery

## 2021-04-06 DIAGNOSIS — Z853 Personal history of malignant neoplasm of breast: Secondary | ICD-10-CM

## 2021-04-06 DIAGNOSIS — N6320 Unspecified lump in the left breast, unspecified quadrant: Secondary | ICD-10-CM | POA: Diagnosis present

## 2021-04-10 ENCOUNTER — Other Ambulatory Visit: Payer: Self-pay | Admitting: Surgery

## 2021-04-10 ENCOUNTER — Telehealth: Payer: Self-pay | Admitting: Surgery

## 2021-04-10 DIAGNOSIS — Z853 Personal history of malignant neoplasm of breast: Secondary | ICD-10-CM

## 2021-04-10 DIAGNOSIS — N6320 Unspecified lump in the left breast, unspecified quadrant: Secondary | ICD-10-CM

## 2021-04-10 NOTE — Telephone Encounter (Signed)
Patient has been advised of Pre-Admission date/time, COVID Testing date and Surgery date.  Surgery Date: 04/27/21 Preadmission Testing Date: 04/20/21 (phone 8a-1p) Covid Testing Date: Not needed.    Patient has been made aware to call [phone], between 1-3:00pm the day before surgery, to find out what time to arrive for surgery.

## 2021-04-13 ENCOUNTER — Ambulatory Visit: Payer: Medicare HMO

## 2021-04-20 ENCOUNTER — Encounter
Admission: RE | Admit: 2021-04-20 | Discharge: 2021-04-20 | Disposition: A | Discharge status: Home / Self Care | Payer: Medicare HMO | Source: Ambulatory Visit | Attending: Surgery | Admitting: Surgery

## 2021-04-20 ENCOUNTER — Ambulatory Visit: Payer: Medicaid Other

## 2021-04-20 ENCOUNTER — Ambulatory Visit: Admission: RE | Admit: 2021-04-20 | Payer: Medicaid Other | Source: Ambulatory Visit

## 2021-04-20 ENCOUNTER — Ambulatory Visit
Admission: RE | Admit: 2021-04-20 | Discharge: 2021-04-20 | Disposition: A | Discharge status: Home / Self Care | Payer: Medicare HMO | Source: Ambulatory Visit | Attending: Surgery | Admitting: Surgery

## 2021-04-20 ENCOUNTER — Other Ambulatory Visit: Payer: Self-pay

## 2021-04-20 DIAGNOSIS — E876 Hypokalemia: Secondary | ICD-10-CM | POA: Insufficient documentation

## 2021-04-20 DIAGNOSIS — Z853 Personal history of malignant neoplasm of breast: Secondary | ICD-10-CM

## 2021-04-20 DIAGNOSIS — N6320 Unspecified lump in the left breast, unspecified quadrant: Secondary | ICD-10-CM | POA: Diagnosis present

## 2021-04-20 HISTORY — DX: Thrombocytosis, unspecified: D75.839

## 2021-04-20 HISTORY — DX: Trigeminal neuralgia: G50.0

## 2021-04-20 HISTORY — DX: Morbid (severe) obesity due to excess calories: E66.01

## 2021-04-20 HISTORY — DX: Systemic inflammatory response syndrome (sirs) of non-infectious origin without acute organ dysfunction: R65.10

## 2021-04-20 HISTORY — DX: Cardiomegaly: I51.7

## 2021-04-20 HISTORY — DX: Sepsis, unspecified organism: A41.9

## 2021-04-20 NOTE — Patient Instructions (Addendum)
Your procedure is scheduled on:04-27-21 Thursday ?Report to the Registration Desk on the 1st floor of the [facility].Then proceed to the 2nd floor Surgery Desk in the [facility] ?To find out your arrival time, please call [phone] between 1PM - 3PM on:04-26-21 Wednesday ? ?REMEMBER: ?Instructions that are not followed completely may result in serious medical risk, up to and including death; or upon the discretion of your surgeon and anesthesiologist your surgery may need to be rescheduled. ? ?Do not eat food after midnight the night before surgery.  ?No gum chewing, lozengers or hard candies. ? ?You may however, drink Water up to 2 hours before you are scheduled to arrive for your surgery. Do not drink anything within 2 hours of your scheduled arrival time. ? ?Type 1 and Type 2 diabetics should only drink water. ? ?TAKE THESE MEDICATIONS THE MORNING OF SURGERY WITH A SIP OF WATER: ?-carvedilol (COREG)  ?-gabapentin (NEURONTIN) ?-omeprazole (PRILOSEC) -take one the night before and one on the morning of surgery - helps to prevent nausea after surgery.) ? ?Stop your Aspirin 5 days prior to surgery-Last dose will be on 04-21-21 Friday ? ?Use your Advair Inhaler and PROAIR HFA Inhaler the day of surgery and bring PROAIR Inhaler to the hospital ? ?Take half of your LANTUS SOLOSTAR the night before your surgery (28 units) and NO insulin the morning of surgery ? ?One week prior to surgery: ?Stop Anti-inflammatories (NSAIDS) such as Advil, Aleve, Ibuprofen, Motrin, Naproxen, Naprosyn and Aspirin based products such as Excedrin, Goodys Powder, BC Powder.You may however, take Tylenol if needed for pain up until the day of surgery. ? ?Stop ANY OVER THE COUNTER supplements/vitamins NOW (04-20-21) until after surgery (Multivitamin) ? ?No Alcohol for 24 hours before or after surgery. ? ?No Smoking including e-cigarettes for 24 hours prior to surgery.  ?No chewable tobacco products for at least 6 hours prior to surgery.  ?No  nicotine patches on the day of surgery. ? ?Do not use any "recreational" drugs for at least a week prior to your surgery.  ?Please be advised that the combination of cocaine and anesthesia may have negative outcomes, up to and including death. ?If you test positive for cocaine, your surgery will be cancelled. ? ?On the morning of surgery brush your teeth with toothpaste and water, you may rinse your mouth with mouthwash if you wish. ?Do not swallow any toothpaste or mouthwash. ? ?Use CHG Soap as directed on instruction sheet. ? ?Do not wear jewelry, make-up, hairpins, clips or nail polish. ? ?Do not wear lotions, powders, or perfumes.  ? ?Do not shave body from the neck down 48 hours prior to surgery just in case you cut yourself which could leave a site for infection.  ?Also, freshly shaved skin may become irritated if using the CHG soap. ? ?Contact lenses, hearing aids and dentures may not be worn into surgery. ? ?Do not bring valuables to the hospital. [facility] is not responsible for any missing/lost belongings or valuables.  ? ?Notify your doctor if there is any change in your medical condition (cold, fever, infection). ? ?Wear comfortable clothing (specific to your surgery type) to the hospital. ? ?After surgery, you can help prevent lung complications by doing breathing exercises.  ?Take deep breaths and cough every 1-2 hours. Your doctor may order a device called an Incentive Spirometer to help you take deep breaths. ?When coughing or sneezing, hold a pillow firmly against your incision with both hands. This is called ?splinting.? Doing  this helps protect your incision. It also decreases belly discomfort. ? ?If you are being admitted to the hospital overnight, leave your suitcase in the car. ?After surgery it may be brought to your room. ? ?If you are being discharged the day of surgery, you will not be allowed to drive home. ?You will need a responsible adult (18 years or older) to drive you home and stay  with you that night.  ? ?If you are taking public transportation, you will need to have a responsible adult (18 years or older) with you. ?Please confirm with your physician that it is acceptable to use public transportation.  ? ?Please call the [facility] at [phone] if you have any questions about these instructions. ? ?Surgery Visitation Policy: ? ?Patients undergoing a surgery or procedure may have one family member or support person with them as long as that person is not COVID-19 positive or experiencing its symptoms.  ?That person may remain in the waiting area during the procedure and may rotate out with other people ?

## 2021-04-21 ENCOUNTER — Encounter
Admission: RE | Admit: 2021-04-21 | Discharge: 2021-04-21 | Disposition: A | Discharge status: Home / Self Care | Payer: Medicare HMO | Source: Ambulatory Visit | Attending: Surgery | Admitting: Surgery

## 2021-04-21 DIAGNOSIS — Z01812 Encounter for preprocedural laboratory examination: Secondary | ICD-10-CM | POA: Diagnosis present

## 2021-04-21 DIAGNOSIS — E876 Hypokalemia: Secondary | ICD-10-CM | POA: Diagnosis not present

## 2021-04-21 LAB — POTASSIUM: Potassium: 3.4 mmol/L — ABNORMAL LOW (ref 3.5–5.1)

## 2021-04-25 ENCOUNTER — Telehealth: Payer: Self-pay

## 2021-04-25 NOTE — Telephone Encounter (Signed)
Medical Clearance received from [place] -low risk-patient optimized for surgery. ?

## 2021-04-26 MED ORDER — CHLORHEXIDINE GLUCONATE CLOTH 2 % EX PADS: 6.0000 | MEDICATED_PAD | Freq: Once | CUTANEOUS | Status: DC

## 2021-04-26 MED ORDER — CHLORHEXIDINE GLUCONATE 0.12 % MT SOLN
15.0000 mL | Freq: Once | OROMUCOSAL | Status: AC
  Administered 2021-04-27: 15 mL via OROMUCOSAL

## 2021-04-26 MED ORDER — CEFAZOLIN SODIUM-DEXTROSE 2-4 GM/100ML-% IV SOLN
2.0000 g | INTRAVENOUS | Status: AC
  Administered 2021-04-27: 2 g via INTRAVENOUS

## 2021-04-26 MED ORDER — ACETAMINOPHEN 500 MG PO TABS: 1000.0000 mg | ORAL_TABLET | ORAL | Status: AC

## 2021-04-26 MED ORDER — GABAPENTIN 300 MG PO CAPS: 300.0000 mg | ORAL_CAPSULE | ORAL | Status: AC

## 2021-04-26 MED ORDER — SODIUM CHLORIDE 0.9 % IV SOLN: INTRAVENOUS | Status: DC

## 2021-04-26 MED ORDER — ORAL CARE MOUTH RINSE: 15.0000 mL | Freq: Once | OROMUCOSAL | Status: AC

## 2021-04-27 ENCOUNTER — Ambulatory Visit
Admission: RE | Admit: 2021-04-27 | Discharge: 2021-04-27 | Disposition: A | Discharge status: Home / Self Care | Payer: Medicare HMO | Source: Ambulatory Visit | Attending: Surgery | Admitting: Surgery

## 2021-04-27 ENCOUNTER — Ambulatory Visit: Payer: Medicare HMO | Admitting: Certified Registered"

## 2021-04-27 ENCOUNTER — Ambulatory Visit
Admission: RE | Admit: 2021-04-27 | Discharge: 2021-04-27 | Disposition: A | Discharge status: Home / Self Care | Payer: Medicare HMO | Attending: Surgery | Admitting: Surgery

## 2021-04-27 ENCOUNTER — Encounter: Payer: Self-pay | Admitting: Surgery

## 2021-04-27 ENCOUNTER — Encounter
Admission: RE | Disposition: A | Discharge status: Home / Self Care | Payer: Self-pay | Source: Home / Self Care | Attending: Surgery

## 2021-04-27 ENCOUNTER — Other Ambulatory Visit: Payer: Self-pay

## 2021-04-27 DIAGNOSIS — J45909 Unspecified asthma, uncomplicated: Secondary | ICD-10-CM | POA: Insufficient documentation

## 2021-04-27 DIAGNOSIS — Z87891 Personal history of nicotine dependence: Secondary | ICD-10-CM | POA: Diagnosis not present

## 2021-04-27 DIAGNOSIS — Z803 Family history of malignant neoplasm of breast: Secondary | ICD-10-CM | POA: Insufficient documentation

## 2021-04-27 DIAGNOSIS — D0512 Intraductal carcinoma in situ of left breast: Secondary | ICD-10-CM | POA: Insufficient documentation

## 2021-04-27 DIAGNOSIS — N6342 Unspecified lump in left breast, subareolar: Secondary | ICD-10-CM

## 2021-04-27 DIAGNOSIS — Z853 Personal history of malignant neoplasm of breast: Secondary | ICD-10-CM | POA: Diagnosis not present

## 2021-04-27 DIAGNOSIS — I10 Essential (primary) hypertension: Secondary | ICD-10-CM | POA: Diagnosis not present

## 2021-04-27 DIAGNOSIS — E1065 Type 1 diabetes mellitus with hyperglycemia: Secondary | ICD-10-CM | POA: Diagnosis not present

## 2021-04-27 DIAGNOSIS — Z9011 Acquired absence of right breast and nipple: Secondary | ICD-10-CM | POA: Diagnosis not present

## 2021-04-27 HISTORY — PX: BREAST LUMPECTOMY WITH RADIOFREQUENCY TAG IDENTIFICATION: SHX6884

## 2021-04-27 LAB — GLUCOSE, CAPILLARY
Glucose-Capillary: 68 mg/dL — ABNORMAL LOW (ref 70–99)
Glucose-Capillary: 78 mg/dL (ref 70–99)

## 2021-04-27 SURGERY — BREAST LUMPECTOMY WITH RADIOFREQUENCY TAG IDENTIFICATION
Anesthesia: General | Laterality: Left

## 2021-04-27 MED ORDER — BUPIVACAINE-EPINEPHRINE (PF) 0.5% -1:200000 IJ SOLN
INTRAMUSCULAR | Status: AC
  Filled 2021-04-27: qty 30

## 2021-04-27 MED ORDER — ALBUTEROL SULFATE HFA 108 (90 BASE) MCG/ACT IN AERS
INHALATION_SPRAY | RESPIRATORY_TRACT | Status: DC | PRN
  Administered 2021-04-27: 4 via RESPIRATORY_TRACT

## 2021-04-27 MED ORDER — OXYCODONE HCL 5 MG PO TABS: 5.0000 mg | ORAL_TABLET | ORAL | 0 refills | Status: DC | PRN

## 2021-04-27 MED ORDER — DEXAMETHASONE SODIUM PHOSPHATE 10 MG/ML IJ SOLN
INTRAMUSCULAR | Status: AC
  Filled 2021-04-27: qty 1

## 2021-04-27 MED ORDER — DEXTROSE 50 % IV SOLN
INTRAVENOUS | Status: AC
  Administered 2021-04-27: 25 mL via INTRAVENOUS
  Filled 2021-04-27: qty 50

## 2021-04-27 MED ORDER — LABETALOL HCL 5 MG/ML IV SOLN
10.0000 mg | INTRAVENOUS | Status: AC | PRN
  Administered 2021-04-27: 10 mg via INTRAVENOUS

## 2021-04-27 MED ORDER — ONDANSETRON HCL 4 MG/2ML IJ SOLN
INTRAMUSCULAR | Status: AC
  Filled 2021-04-27: qty 2

## 2021-04-27 MED ORDER — CEFAZOLIN SODIUM-DEXTROSE 2-4 GM/100ML-% IV SOLN
INTRAVENOUS | Status: AC
  Filled 2021-04-27: qty 100

## 2021-04-27 MED ORDER — IBUPROFEN 600 MG PO TABS: 600.0000 mg | ORAL_TABLET | Freq: Three times a day (TID) | ORAL | 1 refills | Status: DC | PRN

## 2021-04-27 MED ORDER — ONDANSETRON HCL 4 MG/2ML IJ SOLN
INTRAMUSCULAR | Status: DC | PRN
  Administered 2021-04-27: 4 mg via INTRAVENOUS

## 2021-04-27 MED ORDER — STERILE WATER FOR IRRIGATION IR SOLN
Status: DC | PRN
  Administered 2021-04-27: 350 mL

## 2021-04-27 MED ORDER — ACETAMINOPHEN 500 MG PO TABS: 1000.0000 mg | ORAL_TABLET | Freq: Four times a day (QID) | ORAL | Status: AC | PRN

## 2021-04-27 MED ORDER — PHENYLEPHRINE HCL-NACL 20-0.9 MG/250ML-% IV SOLN
INTRAVENOUS | Status: DC | PRN
  Administered 2021-04-27: 30 ug/min via INTRAVENOUS

## 2021-04-27 MED ORDER — BUPIVACAINE-EPINEPHRINE 0.5% -1:200000 IJ SOLN
INTRAMUSCULAR | Status: DC | PRN
  Administered 2021-04-27: 30 mL

## 2021-04-27 MED ORDER — PROPOFOL 10 MG/ML IV BOLUS
INTRAVENOUS | Status: AC
  Filled 2021-04-27: qty 20

## 2021-04-27 MED ORDER — LACTATED RINGERS IV SOLN: INTRAVENOUS | Status: DC | PRN

## 2021-04-27 MED ORDER — CHLORHEXIDINE GLUCONATE 0.12 % MT SOLN
OROMUCOSAL | Status: AC
  Filled 2021-04-27: qty 15

## 2021-04-27 MED ORDER — PROPOFOL 10 MG/ML IV BOLUS
INTRAVENOUS | Status: DC | PRN
  Administered 2021-04-27: 150 mg via INTRAVENOUS

## 2021-04-27 MED ORDER — ROCURONIUM BROMIDE 100 MG/10ML IV SOLN
INTRAVENOUS | Status: DC | PRN
  Administered 2021-04-27: 50 mg via INTRAVENOUS

## 2021-04-27 MED ORDER — FENTANYL CITRATE (PF) 100 MCG/2ML IJ SOLN
INTRAMUSCULAR | Status: DC | PRN
  Administered 2021-04-27: 100 ug via INTRAVENOUS

## 2021-04-27 MED ORDER — ACETAMINOPHEN 10 MG/ML IV SOLN
INTRAVENOUS | Status: AC
  Filled 2021-04-27: qty 100

## 2021-04-27 MED ORDER — ACETAMINOPHEN 500 MG PO TABS
ORAL_TABLET | ORAL | Status: AC
  Administered 2021-04-27: 1000 mg via ORAL
  Filled 2021-04-27: qty 2

## 2021-04-27 MED ORDER — FENTANYL CITRATE (PF) 100 MCG/2ML IJ SOLN
INTRAMUSCULAR | Status: AC
  Filled 2021-04-27: qty 2

## 2021-04-27 MED ORDER — LIDOCAINE HCL (CARDIAC) PF 100 MG/5ML IV SOSY
PREFILLED_SYRINGE | INTRAVENOUS | Status: DC | PRN
  Administered 2021-04-27: 50 mg via INTRAVENOUS

## 2021-04-27 MED ORDER — PHENYLEPHRINE 40 MCG/ML (10ML) SYRINGE FOR IV PUSH (FOR BLOOD PRESSURE SUPPORT)
PREFILLED_SYRINGE | INTRAVENOUS | Status: DC | PRN
Start: 2021-04-27 — End: 2021-04-27
  Administered 2021-04-27: 80 ug via INTRAVENOUS
  Administered 2021-04-27: 120 ug via INTRAVENOUS

## 2021-04-27 MED ORDER — DEXTROSE 50 % IV SOLN: 25.0000 mL | Freq: Once | INTRAVENOUS | Status: AC

## 2021-04-27 MED ORDER — DEXAMETHASONE SODIUM PHOSPHATE 10 MG/ML IJ SOLN
INTRAMUSCULAR | Status: DC | PRN
  Administered 2021-04-27: 5 mg via INTRAVENOUS

## 2021-04-27 MED ORDER — FENTANYL CITRATE (PF) 100 MCG/2ML IJ SOLN: 25.0000 ug | INTRAMUSCULAR | Status: DC | PRN

## 2021-04-27 MED ORDER — GABAPENTIN 300 MG PO CAPS
ORAL_CAPSULE | ORAL | Status: AC
  Administered 2021-04-27: 300 mg via ORAL
  Filled 2021-04-27: qty 1

## 2021-04-27 MED ORDER — ONDANSETRON HCL 4 MG/2ML IJ SOLN
4.0000 mg | Freq: Once | INTRAMUSCULAR | Status: AC | PRN
  Administered 2021-04-27: 4 mg via INTRAVENOUS

## 2021-04-27 MED ORDER — PHENYLEPHRINE HCL-NACL 20-0.9 MG/250ML-% IV SOLN
INTRAVENOUS | Status: AC
  Filled 2021-04-27: qty 250

## 2021-04-27 MED ORDER — LABETALOL HCL 5 MG/ML IV SOLN
INTRAVENOUS | Status: AC
  Administered 2021-04-27: 10 mg via INTRAVENOUS
  Filled 2021-04-27: qty 4

## 2021-04-27 MED ORDER — ACETAMINOPHEN 10 MG/ML IV SOLN
INTRAVENOUS | Status: DC | PRN
Start: 2021-04-27 — End: 2021-04-27
  Administered 2021-04-27: 1000 mg via INTRAVENOUS

## 2021-04-27 MED ORDER — SUGAMMADEX SODIUM 200 MG/2ML IV SOLN
INTRAVENOUS | Status: DC | PRN
  Administered 2021-04-27: 150 mg via INTRAVENOUS

## 2021-04-27 SURGICAL SUPPLY — 47 items
ADH SKN CLS APL DERMABOND .7 (GAUZE/BANDAGES/DRESSINGS) ×1
APL PRP STRL LF DISP 70% ISPRP (MISCELLANEOUS) ×1
BINDER BREAST XLRG (GAUZE/BANDAGES/DRESSINGS) ×1 IMPLANT
BLADE PHOTON ILLUMINATED (MISCELLANEOUS) ×2 IMPLANT
BLADE SURG 15 STRL LF DISP TIS (BLADE) ×2 IMPLANT
BLADE SURG 15 STRL SS (BLADE) ×4
CHLORAPREP W/TINT 26 (MISCELLANEOUS) ×2 IMPLANT
COVER PROBE FLX POLY STRL (MISCELLANEOUS) ×2 IMPLANT
DERMABOND ADVANCED (GAUZE/BANDAGES/DRESSINGS) ×1
DERMABOND ADVANCED .7 DNX12 (GAUZE/BANDAGES/DRESSINGS) ×1 IMPLANT
DEVICE DUBIN SPECIMEN MAMMOGRA (MISCELLANEOUS) ×3 IMPLANT
DRAPE LAPAROTOMY 100X77 ABD (DRAPES) ×2 IMPLANT
DRSG GAUZE FLUFF 36X18 (GAUZE/BANDAGES/DRESSINGS) ×2 IMPLANT
ELECT CAUTERY BLADE TIP 2.5 (TIP) ×2
ELECT REM PT RETURN 9FT ADLT (ELECTROSURGICAL) ×2
ELECTRODE CAUTERY BLDE TIP 2.5 (TIP) ×1 IMPLANT
ELECTRODE REM PT RTRN 9FT ADLT (ELECTROSURGICAL) ×1 IMPLANT
GAUZE 4X4 16PLY ~~LOC~~+RFID DBL (SPONGE) ×2 IMPLANT
GLOVE SURG SYN 7.0 (GLOVE) ×2 IMPLANT
GLOVE SURG SYN 7.0 PF PI (GLOVE) ×1 IMPLANT
GLOVE SURG SYN 7.5  E (GLOVE) ×1
GLOVE SURG SYN 7.5 E (GLOVE) ×1 IMPLANT
GLOVE SURG SYN 7.5 PF PI (GLOVE) ×1 IMPLANT
GOWN STRL REUS W/ TWL LRG LVL3 (GOWN DISPOSABLE) ×2 IMPLANT
GOWN STRL REUS W/TWL LRG LVL3 (GOWN DISPOSABLE) ×4
KIT MARKER MARGIN INK (KITS) ×1 IMPLANT
KIT TURNOVER KIT A (KITS) ×2 IMPLANT
LABEL OR SOLS (LABEL) ×2 IMPLANT
MANIFOLD NEPTUNE II (INSTRUMENTS) ×2 IMPLANT
NDL HYPO 25X1 1.5 SAFETY (NEEDLE) ×1 IMPLANT
NEEDLE HYPO 22GX1.5 SAFETY (NEEDLE) ×2 IMPLANT
NEEDLE HYPO 25X1 1.5 SAFETY (NEEDLE) ×2 IMPLANT
PACK BASIN MINOR ARMC (MISCELLANEOUS) ×2 IMPLANT
SET LOCALIZER 20 PROBE US (MISCELLANEOUS) ×2 IMPLANT
SUT MNCRL 4-0 (SUTURE) ×2
SUT MNCRL 4-0 27XMFL (SUTURE) ×1
SUT SILK 3 0 SH 30 (SUTURE) ×2 IMPLANT
SUT VIC AB 3-0 SH 27 (SUTURE) ×2
SUT VIC AB 3-0 SH 27X BRD (SUTURE) ×1 IMPLANT
SUTURE MNCRL 4-0 27XMF (SUTURE) ×1 IMPLANT
SYR 10ML LL (SYRINGE) ×2 IMPLANT
SYR 20ML LL LF (SYRINGE) ×1 IMPLANT
SYR BULB IRRIG 60ML STRL (SYRINGE) ×2 IMPLANT
TAPE TRANSPORE STRL 2 31045 (GAUZE/BANDAGES/DRESSINGS) ×1 IMPLANT
TRAP NEPTUNE SPECIMEN COLLECT (MISCELLANEOUS) ×2 IMPLANT
WATER STERILE IRR 1000ML POUR (IV SOLUTION) ×2 IMPLANT
WATER STERILE IRR 500ML POUR (IV SOLUTION) ×2 IMPLANT

## 2021-04-27 NOTE — Discharge Instructions (Signed)

## 2021-04-27 NOTE — Op Note (Signed)
?  Procedure Date:  04/27/2021 ? ?Pre-operative Diagnosis:  Left breast retroareolar mass ? ?Post-operative Diagnosis:  Left breast retroareolar mass ? ?Procedure:  Left breast RF tag-localized lumpectomy ? ?Surgeon:  Melvyn Neth, MD ? ?Anesthesia:  General endotracheal ? ?Estimated Blood Loss:  30 ml ? ?Specimens:   ?Left breast retroareolar mass ?Left breast new inferior margin ? ?Complications:  None ? ?Indications for Procedure:  This is a 66 y.o. female who presents with a left breast retroareolar mass.  The risks of bleeding, infection, injury to surrounding structures, hematoma, seroma, open wound, cosmetic deformity, and the need for further surgery were all discussed with the patient and was willing to proceed.  Prior to this procedure, the patient had undergone RF tag localization. ? ?Description of Procedure: ?The patient was correctly identified in the preoperative area and brought into the operating room.  The patient was placed supine with VTE prophylaxis in place.  Appropriate time-outs were performed.  Anesthesia was induced and the patient was intubated.  Appropriate antibiotics were infused. ? ?The left chest was prepped and draped in usual sterile fashion.  The RF tag localization site was determined using the Hologic probe.  An periareolar incision was made overlying the tag and clip.  Hologic probe was used to guide our dissection using electrocautery, and a partial mastectomy was performed.  The biopsy clip was identified at the inferior margin of the specimen, so an additional inferior margin was resected.  MarginMarker was used to ink each of the sides of both specimen.  The initial specimen was then imaged to confirm that the area of concern, biopsy clip, and RF tag were included in the excision.  Both were then sent to pathology.  The cavity was irrigated and hemostasis was assured with electrocautery.  Local anesthetic was infiltrated into the skin and subcutaneous tissue of the  cavity.  The wound was then closed in multiple layers with 3-0 Vicryl and 4-0 Monocryl and sealed with DermaBond. ? ?The patient was emerged from anesthesia and extubated and brought to the recovery room for further management. ? ?The patient tolerated the procedure well and all counts were correct at the end of the case. ? ? ?Melvyn Neth, MD  ?

## 2021-04-27 NOTE — Interval H&P Note (Signed)
History and Physical Interval Note: ? ?04/27/2021 ?1:13 PM ? ?Gail Stevenson  has presented today for surgery, with the diagnosis of Left breast  ADH.  The various methods of treatment have been discussed with the patient and family. After consideration of risks, benefits and other options for treatment, the patient has consented to  Procedure(s): ?BREAST LUMPECTOMY WITH RADIOFREQUENCY TAG IDENTIFICATION (Left) as a surgical intervention.  The patient's history has been reviewed, patient examined, no change in status, stable for surgery.  I have reviewed the patient's chart and labs.  Questions were answered to the patient's satisfaction.   ? ? ?Gail Stevenson ? ? ?

## 2021-04-27 NOTE — Anesthesia Postprocedure Evaluation (Signed)
Anesthesia Post Note ? ?Patient: Gail Stevenson ? ?Procedure(s) Performed: BREAST LUMPECTOMY WITH RADIOFREQUENCY TAG IDENTIFICATION (Left) ? ?Patient location during evaluation: PACU ?Anesthesia Type: General ?Level of consciousness: awake and alert ?Pain management: pain level controlled ?Vital Signs Assessment: post-procedure vital signs reviewed and stable ?Respiratory status: spontaneous breathing, nonlabored ventilation, respiratory function stable and patient connected to nasal cannula oxygen ?Cardiovascular status: blood pressure returned to baseline and stable ?Postop Assessment: no apparent nausea or vomiting ?Anesthetic complications: no ? ? ?No notable events documented. ? ? ?Last Vitals:  ?Vitals:  ? 04/27/21 1705 04/27/21 1710  ?BP:  (!) 169/81  ?Pulse: 69 68  ?Resp: 13 14  ?Temp:    ?SpO2: 96% 96%  ?  ?Last Pain:  ?Vitals:  ? 04/27/21 1700  ?TempSrc:   ?PainSc: 0-No pain  ? ? ?  ?  ?  ?  ?  ?  ? ?Arita Miss ? ? ? ? ?

## 2021-04-27 NOTE — Anesthesia Procedure Notes (Signed)
Procedure Name: Intubation ?Date/Time: 04/27/2021 2:08 PM ?Performed by: Natasha Mead, CRNA ?Pre-anesthesia Checklist: Patient identified, Emergency Drugs available, Suction available and Patient being monitored ?Patient Re-evaluated:Patient Re-evaluated prior to induction ?Oxygen Delivery Method: Circle system utilized ?Preoxygenation: Pre-oxygenation with 100% oxygen ?Induction Type: IV induction ?Ventilation: Mask ventilation without difficulty ?Laryngoscope Size: Sabra Heck and 2 ?Grade View: Grade II ?Tube type: Oral ?Tube size: 7.0 mm ?Number of attempts: 1 ?Airway Equipment and Method: Stylet and Oral airway ?Placement Confirmation: ETT inserted through vocal cords under direct vision, positive ETCO2 and breath sounds checked- equal and bilateral ?Secured at: 22 cm ?Tube secured with: Tape ?Dental Injury: Teeth and Oropharynx as per pre-operative assessment  ? ? ? ? ?

## 2021-04-27 NOTE — Anesthesia Preprocedure Evaluation (Addendum)
Anesthesia Evaluation  ?Patient identified by MRN, date of birth, ID band ?Patient awake ? ? ? ?Reviewed: ?Allergy & Precautions, NPO status , Patient's Chart, lab work & pertinent test results ? ?Airway ?Mallampati: III ? ?TM Distance: <3 FB ?Neck ROM: Full ? ? ? Dental ? ?(+) Teeth Intact ?  ?Pulmonary ?neg pulmonary ROS, asthma , former smoker,  ?Sleep Apnea? ?  ?Pulmonary exam normal ? ?+ decreased breath sounds ? ? ? ? ? Cardiovascular ?hypertension, Pt. on medications ?negative cardio ROS ?Normal cardiovascular exam ?Rhythm:Regular Rate:Normal ? ? ?  ?Neuro/Psych ?Depression negative neurological ROS ? negative psych ROS  ? GI/Hepatic ?negative GI ROS, Neg liver ROS, (+) Hepatitis -, C  ?Endo/Other  ?negative endocrine ROSdiabetes, Poorly Controlled, Type 1Morbid obesity ? Renal/GU ?  ?negative genitourinary ?  ?Musculoskeletal ?negative musculoskeletal ROS ?(+)  ? Abdominal ?(+) + obese,   ?Peds ?negative pediatric ROS ?(+)  Hematology ?negative hematology ROS ?(+)   ?Anesthesia Other Findings ?Past Medical History: ?No date: Asthma ?No date: Breast cancer ([place]) ?No date: Diabetes mellitus without complication ([place]) ?    Comment:  Type 2 ?No date: Diastolic dysfunction ?No date: Diverticulitis ?No date: Esophageal varices without bleeding ([place]) ?No date: Fothergill's neuralgia ?No date: Hemorrhoids ?No date: Hepatitis C ?2003: History of breast cancer ?    Comment:  right side total mastectomy- DCIS ?No date: Hypertension ?No date: Leg swelling ?No date: Leukocytosis ?No date: LVH (left ventricular hypertrophy) ?No date: Morbid obesity ([place]) ?No date: Osteopenia ?No date: Personal history of chemotherapy ?No date: Sepsis ([place]) ?No date: SIRS (systemic inflammatory response syndrome) ([facility]) ?No date: Thrombocytosis ?No date: Trigeminal neuralgia ? ?Past Surgical History: ?03/14/2021: BREAST BIOPSY; Left ?    Comment:  [place] bx ribbon marker,differential diagnosisi of  ?              intraductal papilloma with ADH or DCIS ?2003: BREAST SURGERY; Right ?    Comment:  total mastectomy ?2019: COLONOSCOPY ?01/25/2019: LAPAROTOMY; N/A ?    Comment:  Procedure: EXPLORATORY LAPAROTOMY, ILEOCECECTOMY;   ?             Surgeon: Georganna Skeans, MD;  Location: [facility];  Service: ?             General;  Laterality: N/A; ?2003: MASTECTOMY; Right ?    Comment:  total ?No date: UPPER GI ENDOSCOPY ? ? ? ? Reproductive/Obstetrics ?negative OB ROS ? ?  ? ? ? ? ? ? ? ? ? ? ? ? ? ?  ?  ? ? ? ? ? ? ? ? ?Anesthesia Physical ?Anesthesia Plan ? ?ASA: 3 ? ?Anesthesia Plan: General  ? ?Post-op Pain Management:   ? ?Induction: Intravenous ? ?PONV Risk Score and Plan: 1 and Ondansetron and Dexamethasone ? ?Airway Management Planned: Oral ETT ? ?Additional Equipment:  ? ?Intra-op Plan:  ? ?Post-operative Plan: Extubation in OR ? ?Informed Consent: I have reviewed the patients History and Physical, chart, labs and discussed the procedure including the risks, benefits and alternatives for the proposed anesthesia with the patient or authorized representative who has indicated his/her understanding and acceptance.  ? ? ? ?Dental Advisory Given ? ?Plan Discussed with: CRNA and Surgeon ? ?Anesthesia Plan Comments:   ? ? ? ? ? ? ?Anesthesia Quick Evaluation ? ?

## 2021-04-27 NOTE — Transfer of Care (Addendum)
Immediate Anesthesia Transfer of Care Note ? ?Patient: Gail Stevenson ? ?Procedure(s) Performed: BREAST LUMPECTOMY WITH RADIOFREQUENCY TAG IDENTIFICATION (Left) ? ?Patient Location: PACU ? ?Anesthesia Type:General ? ?Level of Consciousness: awake, drowsy and patient cooperative ? ?Airway & Oxygen Therapy: Patient Spontanous Breathing and Patient connected to face mask oxygen ? ?Post-op Assessment: Report given to RN and Post -op Vital signs reviewed and stable ? ?Post vital signs: Reviewed and MDA made aware of NIBP ? ?Last Vitals:  ?Vitals Value Taken Time  ?BP 195/74   ?Temp    ?Pulse 73 04/27/21 1551  ?Resp 16 04/27/21 1551  ?SpO2 100 % 04/27/21 1551  ?Vitals shown include unvalidated device data. ? ?Last Pain:  ?Vitals:  ? 04/27/21 1300  ?TempSrc: Oral  ?   ? ?  ? ?Complications: No notable events documented. ?

## 2021-04-28 ENCOUNTER — Encounter: Payer: Self-pay | Admitting: Surgery

## 2021-05-03 ENCOUNTER — Other Ambulatory Visit: Payer: Self-pay | Admitting: Anatomic Pathology & Clinical Pathology

## 2021-05-03 ENCOUNTER — Encounter: Payer: Self-pay | Admitting: Surgery

## 2021-05-03 LAB — SURGICAL PATHOLOGY

## 2021-05-03 NOTE — Progress Notes (Signed)
05/03/21 ? ?Called patient to discuss the pathology results of her left breast lumpectomy on 04/27/21.  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  Have discussed with her the potential treatment plan involving radiation therapy and hormonal therapy.  Will send referral for Radiation Oncology and Medical Oncology teams.  She's scheduled to be presented at [facility] on 05/08/21. ? ?Gail Ree, MD

## 2021-05-04 ENCOUNTER — Telehealth: Payer: Self-pay

## 2021-05-04 ENCOUNTER — Other Ambulatory Visit: Payer: Self-pay

## 2021-05-04 DIAGNOSIS — D0510 Intraductal carcinoma in situ of unspecified breast: Secondary | ICD-10-CM

## 2021-05-04 NOTE — Telephone Encounter (Signed)
Referrals received for radiation and medical oncology from Dr. Hampton Abbot. Ms. Lantry has seen Dr. Rogue Bussing previous for hematology. In his absence appointment has been arranged with Dr. Grayland Ormond. Radiation appointment arranged with Dr. Baruch Gouty. Called and went over appointments with Ms. Quentin Cornwall. She states she needs transportation and this was arranged. Spoke to daughter, Luellen Pucker, and we can get physicians to have her on the phone during the consults. All questions answered and she was encouraged to call for further needs. ?

## 2021-05-04 NOTE — Progress Notes (Signed)
Referral placed to radiation and Medical oncology. ?

## 2021-05-08 ENCOUNTER — Other Ambulatory Visit: Payer: Self-pay

## 2021-05-08 ENCOUNTER — Inpatient Hospital Stay: Payer: Medicare HMO

## 2021-05-08 ENCOUNTER — Inpatient Hospital Stay: Payer: Medicare HMO | Attending: Oncology | Admitting: Oncology

## 2021-05-08 ENCOUNTER — Ambulatory Visit
Admission: RE | Admit: 2021-05-08 | Discharge: 2021-05-08 | Disposition: A | Discharge status: Home / Self Care | Payer: Medicare HMO | Source: Ambulatory Visit | Attending: Radiation Oncology | Admitting: Radiation Oncology

## 2021-05-08 VITALS — BP 189/87 | HR 85 | Temp 97.7°F | Resp 16 | Ht 62.0 in | Wt 209.0 lb

## 2021-05-08 DIAGNOSIS — Z8719 Personal history of other diseases of the digestive system: Secondary | ICD-10-CM | POA: Insufficient documentation

## 2021-05-08 DIAGNOSIS — Z800 Family history of malignant neoplasm of digestive organs: Secondary | ICD-10-CM | POA: Insufficient documentation

## 2021-05-08 DIAGNOSIS — E119 Type 2 diabetes mellitus without complications: Secondary | ICD-10-CM | POA: Insufficient documentation

## 2021-05-08 DIAGNOSIS — I501 Left ventricular failure: Secondary | ICD-10-CM | POA: Insufficient documentation

## 2021-05-08 DIAGNOSIS — D0512 Intraductal carcinoma in situ of left breast: Secondary | ICD-10-CM | POA: Diagnosis not present

## 2021-05-08 DIAGNOSIS — G500 Trigeminal neuralgia: Secondary | ICD-10-CM | POA: Insufficient documentation

## 2021-05-08 DIAGNOSIS — I10 Essential (primary) hypertension: Secondary | ICD-10-CM | POA: Insufficient documentation

## 2021-05-08 DIAGNOSIS — Z87891 Personal history of nicotine dependence: Secondary | ICD-10-CM | POA: Insufficient documentation

## 2021-05-08 DIAGNOSIS — D512 Transcobalamin II deficiency: Secondary | ICD-10-CM | POA: Insufficient documentation

## 2021-05-08 DIAGNOSIS — Z170 Estrogen receptor positive status [ER+]: Secondary | ICD-10-CM | POA: Insufficient documentation

## 2021-05-08 DIAGNOSIS — Z803 Family history of malignant neoplasm of breast: Secondary | ICD-10-CM | POA: Insufficient documentation

## 2021-05-08 DIAGNOSIS — Z79899 Other long term (current) drug therapy: Secondary | ICD-10-CM | POA: Insufficient documentation

## 2021-05-08 DIAGNOSIS — M8580 Other specified disorders of bone density and structure, unspecified site: Secondary | ICD-10-CM | POA: Insufficient documentation

## 2021-05-08 NOTE — Progress Notes (Signed)
Supported patient at initial Med/Onc visit with Dr. Grayland Ormond who saw patient today for Dr. Rogue Bussing while he is on PAL.  Patient also saw Dr. Baruch Gouty for initial Radiation consult.  Plans to return for simulation on 4/4.  Lucianne Lei arranged for transportation.  ?

## 2021-05-08 NOTE — Progress Notes (Signed)
?[facility]  ?[phone] [phone] ? ?ID: Gail Stevenson OB: 18-Sep-1955  MR#: [id number]  VZC#:[id number] ? ?Patient Care Team: ?Jonelle Sports, MD as PCP - General (Obstetrics and Gynecology) ? ?CHIEF COMPLAINT: Left breast DCIS ? ?INTERVAL HISTORY: Patient is a 66 year old female who was noted an abnormality in her left breast on routine screening mammogram.  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  She underwent lumpectomy on April 27, 2021 and tolerated the procedure well.  She currently feels well and is asymptomatic.  She has no neurologic complaints.  She denies any recent fevers or illnesses.  She has a good appetite and denies weight loss.  She has no chest pain, shortness of breath, cough, or hemoptysis.  She denies any nausea, vomiting, constipation, or diarrhea.  She has no urinary complaints.  Patient feels at her baseline offers no specific complaints today. ? ?REVIEW OF SYSTEMS:   ?Review of Systems  ?Constitutional: Negative.  Negative for fever, malaise/fatigue and weight loss.  ?Respiratory: Negative.  Negative for cough, hemoptysis and shortness of breath.   ?Cardiovascular: Negative.  Negative for chest pain and leg swelling.  ?Gastrointestinal: Negative.  Negative for abdominal pain.  ?Genitourinary: Negative.  Negative for dysuria.  ?Musculoskeletal: Negative.  Negative for back pain.  ?Skin: Negative.  Negative for rash.  ?Neurological: Negative.  Negative for dizziness, focal weakness, weakness and headaches.  ?Psychiatric/Behavioral: Negative.  The patient is not nervous/anxious.   ? ?As per HPI. Otherwise, a complete review of systems is negative. ? ?PAST MEDICAL HISTORY: ?Past Medical History:  ?Diagnosis Date  ? Asthma   ? Breast cancer ([place])   ? Diabetes mellitus without complication ([place])   ? Type 2  ? Diastolic dysfunction   ? Diverticulitis   ? Esophageal varices without bleeding ([place])   ? Fothergill's neuralgia   ?  Hemorrhoids   ? Hepatitis C   ? History of breast cancer 2003  ? right side total mastectomy- DCIS  ? Hypertension   ? Leg swelling   ? Leukocytosis   ? LVH (left ventricular hypertrophy)   ? Morbid obesity ([place])   ? Osteopenia   ? Personal history of chemotherapy   ? Sepsis ([place])   ? SIRS (systemic inflammatory response syndrome) ([facility])   ? Thrombocytosis   ? Trigeminal neuralgia   ? ? ?PAST SURGICAL HISTORY: ?Past Surgical History:  ?Procedure Laterality Date  ? BREAST BIOPSY Left 03/14/2021  ? [place] bx ribbon marker,differential diagnosisi of intraductal papilloma with ADH or DCIS  ? BREAST LUMPECTOMY WITH RADIOFREQUENCY TAG IDENTIFICATION Left 03/11/7865  ? Procedure: BREAST LUMPECTOMY WITH RADIOFREQUENCY TAG IDENTIFICATION;  Surgeon: Olean Ree, MD;  Location: [facility];  Service: General;  Laterality: Left;  ? BREAST SURGERY Right 2003  ? total mastectomy  ? COLONOSCOPY  2019  ? LAPAROTOMY N/A 01/25/2019  ? Procedure: EXPLORATORY LAPAROTOMY, ILEOCECECTOMY;  Surgeon: Georganna Skeans, MD;  Location: [facility];  Service: General;  Laterality: N/A;  ? MASTECTOMY Right 2003  ? total  ? UPPER GI ENDOSCOPY    ? ? ?FAMILY HISTORY: ?Family History  ?Problem Relation Age of Onset  ? Gastric cancer Father   ? Breast cancer Maternal Aunt   ? Breast cancer Daughter   ? Hypertension Neg Hx   ? Stroke Neg Hx   ? ? ?ADVANCED DIRECTIVES (Y/N):  N ? ?HEALTH MAINTENANCE: ?Social History  ? ?Tobacco Use  ? Smoking status: Former  ?  Packs/day: 0.50  ?  Years: 30.00  ?  Pack years: 15.00  ?  Types: Cigarettes  ?  Quit date: 05/17/1992  ?  Years since quitting: 28.9  ? Smokeless tobacco: Never  ?Vaping Use  ? Vaping Use: Never used  ?Substance Use Topics  ? Alcohol use: Yes  ?  Comment: Occasional beer  ? Drug use: No  ? ? ? Colonoscopy: ? PAP: ? Bone density: ? Lipid panel: ? ?Allergies  ?Allergen Reactions  ? Metformin Other (See Comments) and Swelling  ?  Unable to walk  ? Patanol [Olopatadine]   ?  Unknown reaction   ? Paroxetine Hcl  Rash  ? ? ?Current Outpatient Medications  ?Medication Sig Dispense Refill  ? acetaminophen (TYLENOL) 500 MG tablet Take 2 tablets (1,000 mg total) by mouth every 6 (six) hours as needed for mild pain.    ? aspirin EC 81 MG tablet Take 81 mg by mouth daily.    ? atorvastatin (LIPITOR) 40 MG tablet Take 40 mg by mouth at bedtime.     ? carvedilol (COREG) 12.5 MG tablet Take 12.5 mg by mouth 2 (two) times daily.    ? ENTRESTO 24-26 MG Take 1 tablet by mouth 2 (two) times daily.    ? fluticasone-salmeterol (ADVAIR) 250-50 MCG/ACT AEPB Inhale 1 puff into the lungs in the morning and at bedtime.    ? gabapentin (NEURONTIN) 100 MG capsule Take 100 mg by mouth 2 (two) times daily.    ? ibuprofen (ADVIL) 600 MG tablet Take 1 tablet (600 mg total) by mouth every 8 (eight) hours as needed for moderate pain. 60 tablet 1  ? LANTUS SOLOSTAR 100 UNIT/ML Solostar Pen Inject 56 Units into the skin at bedtime.    ? levalbuterol (XOPENEX HFA) 45 MCG/ACT inhaler Inhale 2 puffs into the lungs every 4 (four) hours as needed for wheezing.    ? loperamide (IMODIUM) 2 MG capsule Take 2 mg by mouth 4 (four) times daily as needed for diarrhea or loose stools.    ? montelukast (SINGULAIR) 10 MG tablet Take 10 mg by mouth at bedtime.    ? Multiple Vitamin (MULTIVITAMIN WITH MINERALS) TABS tablet Take 4 tablets by mouth daily.    ? omeprazole (PRILOSEC) 20 MG capsule Take 20 mg by mouth daily.    ? oxyCODONE (OXY IR/ROXICODONE) 5 MG immediate release tablet Take 1 tablet (5 mg total) by mouth every 4 (four) hours as needed for severe pain. 30 tablet 0  ? PROAIR HFA 108 (90 Base) MCG/ACT inhaler Inhale 2 puffs into the lungs every 6 (six) hours as needed for wheezing or shortness of breath.   5  ? acetaminophen (TYLENOL) 160 MG/5ML liquid Take 500 mg by mouth every 4 (four) hours as needed for fever. (Patient not taking: Reported on 04/20/2021)    ? ?No current facility-administered medications for this visit.  ? ? ?OBJECTIVE: ?Vitals:  ? 05/08/21  0839  ?BP: (!) 189/87  ?Pulse: 85  ?Resp: 16  ?Temp: 97.7 ?F (36.5 ?C)  ?SpO2: 98%  ?   Body mass index is 38.23 kg/m?Marland Kitchen    ECOG FS:0 - Asymptomatic ? ?General: Well-developed, well-nourished, no acute distress. ?Eyes: Pink conjunctiva, anicteric sclera. ?HEENT: Normocephalic, moist mucous membranes. ?Breast: Well-healing left breast lumpectomy scar.   ?Lungs: No audible wheezing or coughing. ?Heart: Regular rate and rhythm. ?Abdomen: Soft, nontender, no obvious distention. ?Musculoskeletal: No edema, cyanosis, or clubbing. ?Neuro: Alert, answering all questions appropriately. Cranial nerves grossly intact. ?Skin: No rashes or petechiae noted. ?Psych: Normal affect. ?Lymphatics: No  cervical, calvicular, axillary or inguinal LAD. ? ? ?LAB RESULTS: ? ?Lab Results  ?Component Value Date  ? NA 138 02/01/2021  ? K 3.4 (L) 04/21/2021  ? CL 105 02/01/2021  ? CO2 28 02/01/2021  ? GLUCOSE 123 (H) 02/01/2021  ? BUN 14 02/01/2021  ? CREATININE 0.85 02/01/2021  ? CALCIUM 8.7 (L) 02/01/2021  ? PROT 7.6 02/01/2021  ? ALBUMIN 3.8 02/01/2021  ? AST 20 02/01/2021  ? ALT 21 02/01/2021  ? ALKPHOS 81 02/01/2021  ? BILITOT 0.7 02/01/2021  ? GFRNONAA >60 02/01/2021  ? GFRAA >60 02/01/2019  ? ? ?Lab Results  ?Component Value Date  ? WBC 10.6 (H) 02/01/2021  ? NEUTROABS 5.7 02/01/2021  ? HGB 12.6 02/01/2021  ? HCT 39.5 02/01/2021  ? MCV 92.5 02/01/2021  ? PLT 380 02/01/2021  ? ? ? ?STUDIES: ?MM Breast Surgical Specimen ? ?Result Date: 04/27/2021 ?CLINICAL DATA:  Status post RF tag localized left breast lumpectomy. EXAM: SPECIMEN RADIOGRAPH OF THE LEFT BREAST COMPARISON:  Previous exam(s). FINDINGS: Status post excision of the left breast. The are attack and ribbon shaped clip are present within the specimen. IMPRESSION: Specimen radiograph of the left breast. Electronically Signed   By: Lajean Manes M.D.   On: 04/27/2021 15:07 ? ?MM LT RADIO FREQUENCY TAG LOC MAMMO GUIDE ? ?Result Date: 04/20/2021 ?CLINICAL DATA:  Status post left breast  biopsy which showed fragments of papillary epithelial proliferation with atypia/ribbon clip. RF localizer device placement is requested prior to left breast excision. EXAM: MAMMOGRAPHIC GUIDED RADIOFREQUENCY

## 2021-05-08 NOTE — Progress Notes (Signed)
Pt c/o painful heaviness to hands and feet "for a while now." ?

## 2021-05-08 NOTE — Consult Note (Signed)
?NEW PATIENT EVALUATION ? ?Name: Gail Stevenson  ?MRN: [id number]  ?Date:   05/08/2021     ?DOB: 1956-02-06 ? ? ?This 66 y.o. 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 ? ?REFERRING PHYSICIAN: Jonelle Sports, MD ? ?CHIEF COMPLAINT:  ?Chief Complaint  ?Patient presents with  ? Breast Cancer  ?  Initial consultation  ? ? ?DIAGNOSIS: The encounter diagnosis was Ductal carcinoma in situ (DCIS) of left breast. ?  ?PREVIOUS INVESTIGATIONS:  ?Mammograms and ultrasound reviewed and ?Clinical notes reviewed ?Pathology report reviewed ? ?HPI: Patient is a 66 year old female whose daughter was a prior treatment of ours.  She presented back in July 2022 with a slightly abnormal mammogram showing benign duct ectasia in her left breast at the 9 o'clock position.  Recommendation was for ultrasound in 6 months at which time there was a possible intraductal mass in the left retroareolar region.  She underwent ultrasound-guided biopsy which was positive for fragments of papillary epithelium with proliferation of atypia and vascular calcifications.  She went on to have a wide local excision showing ductal carcinoma in situ at least 4 mm with margins clear at 2 mm.  There is no evidence of invasive mammary carcinoma no lymph nodes were sampled.  Tumor was ER positive.  She is tolerated her surgery well.  She is status post the right modified radical mastectomy.  Seen today for consideration of treatment.  She specifically denies left breast tenderness cough or bone pain. ? ?PLANNED TREATMENT REGIMEN: Left whole breast radiation ? ?PAST MEDICAL HISTORY:  has a past medical history of Asthma, Breast cancer ([place]), Diabetes mellitus without complication ([place]), Diastolic dysfunction, Diverticulitis, Esophageal varices without bleeding ([place]), Fothergill's neuralgia, Hemorrhoids, Hepatitis  C, History of breast cancer (2003), Hypertension, Leg swelling, Leukocytosis, LVH (left ventricular hypertrophy), Morbid obesity ([place]), Osteopenia, Personal history of chemotherapy, Sepsis ([place]), SIRS (systemic inflammatory response syndrome) ([place]), Thrombocytosis, and Trigeminal neuralgia.   ? ?PAST SURGICAL HISTORY:  ?Past Surgical History:  ?Procedure Laterality Date  ? BREAST BIOPSY Left 03/14/2021  ? [place] bx ribbon marker,differential diagnosisi of intraductal papilloma with ADH or DCIS  ? BREAST LUMPECTOMY WITH RADIOFREQUENCY TAG IDENTIFICATION Left 6/56/8127  ? Procedure: BREAST LUMPECTOMY WITH RADIOFREQUENCY TAG IDENTIFICATION;  Surgeon: Olean Ree, MD;  Location: [facility];  Service: General;  Laterality: Left;  ? BREAST SURGERY Right 2003  ? total mastectomy  ? COLONOSCOPY  2019  ? LAPAROTOMY N/A 01/25/2019  ? Procedure: EXPLORATORY LAPAROTOMY, ILEOCECECTOMY;  Surgeon: Georganna Skeans, MD;  Location: [facility];  Service: General;  Laterality: N/A;  ? MASTECTOMY Right 2003  ? total  ? UPPER GI ENDOSCOPY    ? ? ?FAMILY HISTORY: family history includes Breast cancer in her daughter and maternal aunt; Gastric cancer in her father. ? ?SOCIAL HISTORY:  reports that she quit smoking about 28 years ago. Her smoking use included cigarettes. She has a 15.00 pack-year smoking history. She has never used smokeless tobacco. She reports current alcohol use. She reports that she does not use drugs. ? ?ALLERGIES: Metformin, Patanol [olopatadine], and Paroxetine hcl ? ?MEDICATIONS:  ?Current Outpatient Medications  ?Medication Sig Dispense Refill  ? acetaminophen (TYLENOL) 160 MG/5ML liquid Take 500 mg by mouth every 4 (four) hours as needed for fever. (Patient not taking: Reported on 04/20/2021)    ? acetaminophen (TYLENOL) 500 MG tablet Take 2 tablets (1,000 mg total)  by mouth every 6 (six) hours as needed for mild pain.    ? aspirin EC 81 MG tablet Take 81 mg by mouth daily.    ? atorvastatin (LIPITOR) 40 MG tablet Take 40 mg  by mouth at bedtime.     ? carvedilol (COREG) 12.5 MG tablet Take 12.5 mg by mouth 2 (two) times daily.    ? ENTRESTO 24-26 MG Take 1 tablet by mouth 2 (two) times daily.    ? fluticasone-salmeterol (ADVAIR) 250-50 MCG/ACT AEPB Inhale 1 puff into the lungs in the morning and at bedtime.    ? gabapentin (NEURONTIN) 100 MG capsule Take 100 mg by mouth 2 (two) times daily.    ? ibuprofen (ADVIL) 600 MG tablet Take 1 tablet (600 mg total) by mouth every 8 (eight) hours as needed for moderate pain. 60 tablet 1  ? LANTUS SOLOSTAR 100 UNIT/ML Solostar Pen Inject 56 Units into the skin at bedtime.    ? levalbuterol (XOPENEX HFA) 45 MCG/ACT inhaler Inhale 2 puffs into the lungs every 4 (four) hours as needed for wheezing.    ? loperamide (IMODIUM) 2 MG capsule Take 2 mg by mouth 4 (four) times daily as needed for diarrhea or loose stools.    ? montelukast (SINGULAIR) 10 MG tablet Take 10 mg by mouth at bedtime.    ? Multiple Vitamin (MULTIVITAMIN WITH MINERALS) TABS tablet Take 4 tablets by mouth daily.    ? omeprazole (PRILOSEC) 20 MG capsule Take 20 mg by mouth daily.    ? oxyCODONE (OXY IR/ROXICODONE) 5 MG immediate release tablet Take 1 tablet (5 mg total) by mouth every 4 (four) hours as needed for severe pain. 30 tablet 0  ? PROAIR HFA 108 (90 Base) MCG/ACT inhaler Inhale 2 puffs into the lungs every 6 (six) hours as needed for wheezing or shortness of breath.   5  ? ?No current facility-administered medications for this encounter.  ? ? ?ECOG PERFORMANCE STATUS:  0 - Asymptomatic ? ?REVIEW OF SYSTEMS: ?Patient denies any weight loss, fatigue, weakness, fever, chills or night sweats. Patient denies any loss of vision, blurred vision. Patient denies any ringing  of the ears or hearing loss. No irregular heartbeat. Patient denies heart murmur or history of fainting. Patient denies any chest pain or pain radiating to her upper extremities. Patient denies any shortness of breath, difficulty breathing at night, cough or  hemoptysis. Patient denies any swelling in the lower legs. Patient denies any nausea vomiting, vomiting of blood, or coffee ground material in the vomitus. Patient denies any stomach pain. Patient states has had normal bowel movements no significant constipation or diarrhea. Patient denies any dysuria, hematuria or significant nocturia. Patient denies any problems walking, swelling in the joints or loss of balance. Patient denies any skin changes, loss of hair or loss of weight. Patient denies any excessive worrying or anxiety or significant depression. Patient denies any problems with insomnia. Patient denies excessive thirst, polyuria, polydipsia. Patient denies any swollen glands, patient denies easy bruising or easy bleeding. Patient denies any recent infections, allergies or URI. Patient "s visual fields have not changed significantly in recent time. ?  ?PHYSICAL EXAM: ?There were no vitals taken for this visit.  Patient is left breast is large and pendulous. ?Patient is status post right modified radical mastectomy chest wall is clear without evidence of nodularity or mass.  She is status post right lumpectomy with incision healing well.  No dominant mass in the left breast is noted.  No  axillary or supraclavicular adenopathy is identified.  Well-developed well-nourished patient in NAD. HEENT reveals PERLA, EOMI, discs not visualized.  Oral cavity is clear. No oral mucosal lesions are identified. Neck is clear without evidence of cervical or supraclavicular adenopathy. Lungs are clear to A&P. Cardiac examination is essentially unremarkable with regular rate and rhythm without murmur rub or thrill. Abdomen is benign with no organomegaly or masses noted. Motor sensory and DTR levels are equal and symmetric in the upper and lower extremities. Cranial nerves II through XII are grossly intact. Proprioception is intact. No peripheral adenopathy or edema is identified. No motor or sensory levels are noted. Crude  visual fields are within normal range. ? ?LABORATORY DATA: Pathology reports reviewed ? ?  ?RADIOLOGY RESULTS: Mammogram and ultrasound reviewed compatible with above-stated findings ? ? ?IMPRESSION: Ductal

## 2021-05-09 ENCOUNTER — Ambulatory Visit: Payer: Medicaid Other | Admitting: Radiation Oncology

## 2021-05-09 ENCOUNTER — Ambulatory Visit: Payer: Medicaid Other

## 2021-05-15 ENCOUNTER — Encounter: Payer: Self-pay | Admitting: Surgery

## 2021-05-15 ENCOUNTER — Ambulatory Visit (INDEPENDENT_AMBULATORY_CARE_PROVIDER_SITE_OTHER): Payer: Medicaid Other | Admitting: Surgery

## 2021-05-15 VITALS — BP 177/88 | HR 88 | Temp 98.2°F | Ht 62.0 in | Wt 188.2 lb

## 2021-05-15 DIAGNOSIS — Z09 Encounter for follow-up examination after completed treatment for conditions other than malignant neoplasm: Secondary | ICD-10-CM

## 2021-05-15 DIAGNOSIS — D0512 Intraductal carcinoma in situ of left breast: Secondary | ICD-10-CM

## 2021-05-15 NOTE — Progress Notes (Signed)
05/15/2021 ? ?HPI: ?Gail Stevenson is a 66 y.o. female s/p left breast lumpectomy for a left retroareolar mass on 04/27/21.  Final pathology showed DCIS.  She has met with Dr. Grayland Ormond and with Dr. Baruch Gouty already and presents today for post-op follow up.  Reports that she's doing well with exception of some discomfort at the incision/breast.  Denies any issues with the incision itself and denies andy drainage.  Has not been wearing her breast binder. ? ?Vital signs: ?BP (!) 177/88   Pulse 88   Temp 98.2 ?F (36.8 ?C) (Oral)   Ht _0  (1.575 m)   Wt 188 lb 3.2 oz (85.4 kg)   SpO2 97%   BMI 34.42 kg/m?   ? ?Physical Exam: ?Constitutional: No acute distress ?Breast:  Left breast periareaolar incision in upper outer quadrant is healing well, without any dehiscence, erythema, or induration.  DermaBond is starting to peel off.  There is palpable firmness deep to the incision which is likely consistent with scarring and also remnants of the hematoma she had after the biopsy. ? ?Assessment/Plan: ?This is a 66 y.o. female s/p left breast lumpectomy for DCIS ? ?--Discussed the findings again with the patient and she has seen Dr. Baruch Gouty to get set up with radiation therapy and with Dr. Grayland Ormond to be set up with endocrine therapy as well.  Discussed that the firmness she can feel is related to scarring and likely the hematoma she had after surgery.  This will continue to improve as the healing continues.  She may start applying moisturizing lotion to help with any skin dryness. ?--No contraindication at this point for radiation therapy. ?--Follow up in about 6 months. ? ? ?Melvyn Neth, MD ?[facility]  ?

## 2021-05-15 NOTE — Patient Instructions (Signed)
We will reach out to you in October 2023 to schedule a follow up breast exam.  ?

## 2021-05-16 ENCOUNTER — Ambulatory Visit
Admission: RE | Admit: 2021-05-16 | Discharge: 2021-05-16 | Disposition: A | Discharge status: Home / Self Care | Payer: Medicare Other | Source: Ambulatory Visit | Attending: Radiation Oncology | Admitting: Radiation Oncology

## 2021-05-16 DIAGNOSIS — Z510 Encounter for antineoplastic radiation therapy: Secondary | ICD-10-CM | POA: Diagnosis not present

## 2021-05-16 DIAGNOSIS — D0512 Intraductal carcinoma in situ of left breast: Secondary | ICD-10-CM | POA: Diagnosis present

## 2021-05-19 ENCOUNTER — Other Ambulatory Visit: Payer: Self-pay | Admitting: *Deleted

## 2021-05-19 DIAGNOSIS — D0512 Intraductal carcinoma in situ of left breast: Secondary | ICD-10-CM

## 2021-05-23 ENCOUNTER — Inpatient Hospital Stay: Payer: Medicare Other | Attending: Oncology

## 2021-05-23 ENCOUNTER — Ambulatory Visit: Admission: RE | Admit: 2021-05-23 | Payer: Medicare Other | Source: Ambulatory Visit

## 2021-05-23 DIAGNOSIS — D0512 Intraductal carcinoma in situ of left breast: Secondary | ICD-10-CM | POA: Insufficient documentation

## 2021-05-24 ENCOUNTER — Ambulatory Visit
Admission: RE | Admit: 2021-05-24 | Discharge: 2021-05-24 | Disposition: A | Discharge status: Home / Self Care | Payer: Medicare Other | Source: Ambulatory Visit | Attending: Radiation Oncology | Admitting: Radiation Oncology

## 2021-05-24 ENCOUNTER — Inpatient Hospital Stay: Payer: Medicare Other

## 2021-05-24 DIAGNOSIS — D0512 Intraductal carcinoma in situ of left breast: Secondary | ICD-10-CM | POA: Diagnosis not present

## 2021-05-25 ENCOUNTER — Ambulatory Visit
Admission: RE | Admit: 2021-05-25 | Discharge: 2021-05-25 | Disposition: A | Discharge status: Home / Self Care | Payer: Medicare Other | Source: Ambulatory Visit | Attending: Radiation Oncology | Admitting: Radiation Oncology

## 2021-05-25 ENCOUNTER — Inpatient Hospital Stay: Payer: Medicare Other

## 2021-05-25 DIAGNOSIS — D0512 Intraductal carcinoma in situ of left breast: Secondary | ICD-10-CM | POA: Diagnosis not present

## 2021-05-26 ENCOUNTER — Inpatient Hospital Stay: Payer: Medicare Other

## 2021-05-26 ENCOUNTER — Ambulatory Visit
Admission: RE | Admit: 2021-05-26 | Discharge: 2021-05-26 | Disposition: A | Discharge status: Home / Self Care | Payer: Medicare Other | Source: Ambulatory Visit | Attending: Radiation Oncology | Admitting: Radiation Oncology

## 2021-05-26 DIAGNOSIS — D0512 Intraductal carcinoma in situ of left breast: Secondary | ICD-10-CM | POA: Diagnosis not present

## 2021-05-29 ENCOUNTER — Ambulatory Visit
Admission: RE | Admit: 2021-05-29 | Discharge: 2021-05-29 | Disposition: A | Discharge status: Home / Self Care | Payer: Medicare Other | Source: Ambulatory Visit | Attending: Radiation Oncology | Admitting: Radiation Oncology

## 2021-05-29 ENCOUNTER — Inpatient Hospital Stay: Payer: Medicare Other

## 2021-05-29 DIAGNOSIS — D0512 Intraductal carcinoma in situ of left breast: Secondary | ICD-10-CM | POA: Diagnosis not present

## 2021-05-30 ENCOUNTER — Inpatient Hospital Stay: Payer: Medicare Other

## 2021-05-30 ENCOUNTER — Ambulatory Visit
Admission: RE | Admit: 2021-05-30 | Discharge: 2021-05-30 | Disposition: A | Discharge status: Home / Self Care | Payer: Medicare Other | Source: Ambulatory Visit | Attending: Radiation Oncology | Admitting: Radiation Oncology

## 2021-05-30 ENCOUNTER — Other Ambulatory Visit: Payer: Self-pay

## 2021-05-30 DIAGNOSIS — D0512 Intraductal carcinoma in situ of left breast: Secondary | ICD-10-CM | POA: Diagnosis not present

## 2021-05-30 LAB — RAD ONC ARIA SESSION SUMMARY
Course Elapsed Days: 6
Plan Fractions Treated to Date: 5
Plan Prescribed Dose Per Fraction: 2.66 Gy
Plan Total Fractions Prescribed: 16
Plan Total Prescribed Dose: 42.56 Gy
Reference Point Dosage Given to Date: 13.3 Gy
Reference Point Session Dosage Given: 2.66 Gy
Session Number: 5

## 2021-05-31 ENCOUNTER — Other Ambulatory Visit: Payer: Self-pay

## 2021-05-31 ENCOUNTER — Inpatient Hospital Stay: Payer: Medicare Other

## 2021-05-31 ENCOUNTER — Ambulatory Visit
Admission: RE | Admit: 2021-05-31 | Discharge: 2021-05-31 | Disposition: A | Discharge status: Home / Self Care | Payer: Medicare Other | Source: Ambulatory Visit | Attending: Radiation Oncology | Admitting: Radiation Oncology

## 2021-05-31 DIAGNOSIS — D0512 Intraductal carcinoma in situ of left breast: Secondary | ICD-10-CM | POA: Diagnosis not present

## 2021-05-31 LAB — RAD ONC ARIA SESSION SUMMARY
Course Elapsed Days: 7
Plan Fractions Treated to Date: 6
Plan Prescribed Dose Per Fraction: 2.66 Gy
Plan Total Fractions Prescribed: 16
Plan Total Prescribed Dose: 42.56 Gy
Reference Point Dosage Given to Date: 15.96 Gy
Reference Point Session Dosage Given: 2.66 Gy
Session Number: 6

## 2021-06-01 ENCOUNTER — Ambulatory Visit
Admission: RE | Admit: 2021-06-01 | Discharge: 2021-06-01 | Disposition: A | Discharge status: Home / Self Care | Payer: Medicare Other | Source: Ambulatory Visit | Attending: Radiation Oncology | Admitting: Radiation Oncology

## 2021-06-01 ENCOUNTER — Other Ambulatory Visit: Payer: Self-pay

## 2021-06-01 ENCOUNTER — Inpatient Hospital Stay: Payer: Medicare Other

## 2021-06-01 DIAGNOSIS — D0512 Intraductal carcinoma in situ of left breast: Secondary | ICD-10-CM | POA: Diagnosis not present

## 2021-06-01 LAB — RAD ONC ARIA SESSION SUMMARY
Course Elapsed Days: 8
Plan Fractions Treated to Date: 7
Plan Prescribed Dose Per Fraction: 2.66 Gy
Plan Total Fractions Prescribed: 16
Plan Total Prescribed Dose: 42.56 Gy
Reference Point Dosage Given to Date: 18.62 Gy
Reference Point Session Dosage Given: 2.66 Gy
Session Number: 7

## 2021-06-02 ENCOUNTER — Other Ambulatory Visit: Payer: Self-pay

## 2021-06-02 ENCOUNTER — Ambulatory Visit
Admission: RE | Admit: 2021-06-02 | Discharge: 2021-06-02 | Disposition: A | Discharge status: Home / Self Care | Payer: Medicare Other | Source: Ambulatory Visit | Attending: Radiation Oncology | Admitting: Radiation Oncology

## 2021-06-02 ENCOUNTER — Inpatient Hospital Stay: Payer: Medicare Other

## 2021-06-02 DIAGNOSIS — D0512 Intraductal carcinoma in situ of left breast: Secondary | ICD-10-CM | POA: Diagnosis not present

## 2021-06-02 LAB — RAD ONC ARIA SESSION SUMMARY
Course Elapsed Days: 9
Plan Fractions Treated to Date: 8
Plan Prescribed Dose Per Fraction: 2.66 Gy
Plan Total Fractions Prescribed: 16
Plan Total Prescribed Dose: 42.56 Gy
Reference Point Dosage Given to Date: 21.28 Gy
Reference Point Session Dosage Given: 2.66 Gy
Session Number: 8

## 2021-06-05 ENCOUNTER — Other Ambulatory Visit: Payer: Self-pay

## 2021-06-05 ENCOUNTER — Ambulatory Visit
Admission: RE | Admit: 2021-06-05 | Discharge: 2021-06-05 | Disposition: A | Discharge status: Home / Self Care | Payer: Medicare Other | Source: Ambulatory Visit | Attending: Radiation Oncology | Admitting: Radiation Oncology

## 2021-06-05 ENCOUNTER — Inpatient Hospital Stay: Payer: Medicare Other

## 2021-06-05 DIAGNOSIS — D0512 Intraductal carcinoma in situ of left breast: Secondary | ICD-10-CM | POA: Diagnosis not present

## 2021-06-05 LAB — RAD ONC ARIA SESSION SUMMARY
Course Elapsed Days: 12
Plan Fractions Treated to Date: 9
Plan Prescribed Dose Per Fraction: 2.66 Gy
Plan Total Fractions Prescribed: 16
Plan Total Prescribed Dose: 42.56 Gy
Reference Point Dosage Given to Date: 23.94 Gy
Reference Point Session Dosage Given: 2.66 Gy
Session Number: 9

## 2021-06-06 ENCOUNTER — Other Ambulatory Visit: Payer: Self-pay

## 2021-06-06 ENCOUNTER — Ambulatory Visit
Admission: RE | Admit: 2021-06-06 | Discharge: 2021-06-06 | Disposition: A | Discharge status: Home / Self Care | Payer: Medicare Other | Source: Ambulatory Visit | Attending: Radiation Oncology | Admitting: Radiation Oncology

## 2021-06-06 ENCOUNTER — Ambulatory Visit: Payer: Medicare Other

## 2021-06-06 ENCOUNTER — Inpatient Hospital Stay: Payer: Medicare Other

## 2021-06-06 DIAGNOSIS — D0512 Intraductal carcinoma in situ of left breast: Secondary | ICD-10-CM | POA: Diagnosis not present

## 2021-06-06 LAB — RAD ONC ARIA SESSION SUMMARY
Course Elapsed Days: 13
Plan Fractions Treated to Date: 10
Plan Prescribed Dose Per Fraction: 2.66 Gy
Plan Total Fractions Prescribed: 16
Plan Total Prescribed Dose: 42.56 Gy
Reference Point Dosage Given to Date: 26.6 Gy
Reference Point Session Dosage Given: 2.66 Gy
Session Number: 10

## 2021-06-07 ENCOUNTER — Inpatient Hospital Stay: Payer: Medicare Other

## 2021-06-07 ENCOUNTER — Other Ambulatory Visit: Payer: Self-pay

## 2021-06-07 ENCOUNTER — Ambulatory Visit
Admission: RE | Admit: 2021-06-07 | Discharge: 2021-06-07 | Disposition: A | Discharge status: Home / Self Care | Payer: Medicare Other | Source: Ambulatory Visit | Attending: Radiation Oncology | Admitting: Radiation Oncology

## 2021-06-07 DIAGNOSIS — D0512 Intraductal carcinoma in situ of left breast: Secondary | ICD-10-CM | POA: Diagnosis not present

## 2021-06-07 LAB — RAD ONC ARIA SESSION SUMMARY
Course Elapsed Days: 14
Plan Fractions Treated to Date: 11
Plan Prescribed Dose Per Fraction: 2.66 Gy
Plan Total Fractions Prescribed: 16
Plan Total Prescribed Dose: 42.56 Gy
Reference Point Dosage Given to Date: 29.26 Gy
Reference Point Session Dosage Given: 2.66 Gy
Session Number: 11

## 2021-06-07 LAB — CBC
HCT: 40.4 % (ref 36.0–46.0)
Hemoglobin: 13.1 g/dL (ref 12.0–15.0)
MCH: 29.5 pg (ref 26.0–34.0)
MCHC: 32.4 g/dL (ref 30.0–36.0)
MCV: 91 fL (ref 80.0–100.0)
Platelets: 308 10*3/uL (ref 150–400)
RBC: 4.44 MIL/uL (ref 3.87–5.11)
RDW: 12.6 % (ref 11.5–15.5)
WBC: 10.1 10*3/uL (ref 4.0–10.5)
nRBC: 0 % (ref 0.0–0.2)

## 2021-06-08 ENCOUNTER — Ambulatory Visit: Payer: Medicare Other

## 2021-06-08 ENCOUNTER — Inpatient Hospital Stay: Payer: Medicare Other

## 2021-06-08 ENCOUNTER — Other Ambulatory Visit: Payer: Self-pay

## 2021-06-08 ENCOUNTER — Ambulatory Visit
Admission: RE | Admit: 2021-06-08 | Discharge: 2021-06-08 | Disposition: A | Discharge status: Home / Self Care | Payer: Medicare Other | Source: Ambulatory Visit | Attending: Radiation Oncology | Admitting: Radiation Oncology

## 2021-06-08 DIAGNOSIS — D0512 Intraductal carcinoma in situ of left breast: Secondary | ICD-10-CM | POA: Diagnosis not present

## 2021-06-08 LAB — RAD ONC ARIA SESSION SUMMARY
Course Elapsed Days: 15
Plan Fractions Treated to Date: 12
Plan Prescribed Dose Per Fraction: 2.66 Gy
Plan Total Fractions Prescribed: 16
Plan Total Prescribed Dose: 42.56 Gy
Reference Point Dosage Given to Date: 31.92 Gy
Reference Point Session Dosage Given: 2.66 Gy
Session Number: 12

## 2021-06-09 ENCOUNTER — Other Ambulatory Visit: Payer: Self-pay

## 2021-06-09 ENCOUNTER — Inpatient Hospital Stay: Payer: Medicare Other

## 2021-06-09 ENCOUNTER — Ambulatory Visit
Admission: RE | Admit: 2021-06-09 | Discharge: 2021-06-09 | Disposition: A | Discharge status: Home / Self Care | Payer: Medicare Other | Source: Ambulatory Visit | Attending: Radiation Oncology | Admitting: Radiation Oncology

## 2021-06-09 DIAGNOSIS — D0512 Intraductal carcinoma in situ of left breast: Secondary | ICD-10-CM | POA: Diagnosis not present

## 2021-06-09 LAB — RAD ONC ARIA SESSION SUMMARY
Course Elapsed Days: 16
Plan Fractions Treated to Date: 13
Plan Prescribed Dose Per Fraction: 2.66 Gy
Plan Total Fractions Prescribed: 16
Plan Total Prescribed Dose: 42.56 Gy
Reference Point Dosage Given to Date: 34.58 Gy
Reference Point Session Dosage Given: 2.66 Gy
Session Number: 13

## 2021-06-12 ENCOUNTER — Ambulatory Visit
Admission: RE | Admit: 2021-06-12 | Discharge: 2021-06-12 | Disposition: A | Discharge status: Home / Self Care | Payer: Medicare Other | Source: Ambulatory Visit | Attending: Radiation Oncology | Admitting: Radiation Oncology

## 2021-06-12 ENCOUNTER — Other Ambulatory Visit: Payer: Self-pay

## 2021-06-12 ENCOUNTER — Inpatient Hospital Stay: Payer: Medicare Other | Attending: Oncology

## 2021-06-12 DIAGNOSIS — D0512 Intraductal carcinoma in situ of left breast: Secondary | ICD-10-CM | POA: Insufficient documentation

## 2021-06-12 DIAGNOSIS — Z510 Encounter for antineoplastic radiation therapy: Secondary | ICD-10-CM | POA: Insufficient documentation

## 2021-06-12 LAB — RAD ONC ARIA SESSION SUMMARY
Course Elapsed Days: 19
Plan Fractions Treated to Date: 14
Plan Prescribed Dose Per Fraction: 2.66 Gy
Plan Total Fractions Prescribed: 16
Plan Total Prescribed Dose: 42.56 Gy
Reference Point Dosage Given to Date: 37.24 Gy
Reference Point Session Dosage Given: 2.66 Gy
Session Number: 14

## 2021-06-13 ENCOUNTER — Inpatient Hospital Stay: Payer: Medicare Other

## 2021-06-13 ENCOUNTER — Ambulatory Visit
Admission: RE | Admit: 2021-06-13 | Discharge: 2021-06-13 | Disposition: A | Discharge status: Home / Self Care | Payer: Medicare Other | Source: Ambulatory Visit | Attending: Radiation Oncology | Admitting: Radiation Oncology

## 2021-06-13 ENCOUNTER — Other Ambulatory Visit: Payer: Self-pay

## 2021-06-13 DIAGNOSIS — D0512 Intraductal carcinoma in situ of left breast: Secondary | ICD-10-CM | POA: Diagnosis not present

## 2021-06-13 LAB — RAD ONC ARIA SESSION SUMMARY
Course Elapsed Days: 20
Plan Fractions Treated to Date: 15
Plan Prescribed Dose Per Fraction: 2.66 Gy
Plan Total Fractions Prescribed: 16
Plan Total Prescribed Dose: 42.56 Gy
Reference Point Dosage Given to Date: 39.9 Gy
Reference Point Session Dosage Given: 2.66 Gy
Session Number: 15

## 2021-06-14 ENCOUNTER — Other Ambulatory Visit: Payer: Self-pay

## 2021-06-14 ENCOUNTER — Inpatient Hospital Stay: Payer: Medicare Other

## 2021-06-14 ENCOUNTER — Ambulatory Visit
Admission: RE | Admit: 2021-06-14 | Discharge: 2021-06-14 | Disposition: A | Discharge status: Home / Self Care | Payer: Medicare Other | Source: Ambulatory Visit | Attending: Radiation Oncology | Admitting: Radiation Oncology

## 2021-06-14 DIAGNOSIS — D0512 Intraductal carcinoma in situ of left breast: Secondary | ICD-10-CM | POA: Diagnosis not present

## 2021-06-14 LAB — RAD ONC ARIA SESSION SUMMARY
Course Elapsed Days: 21
Plan Fractions Treated to Date: 16
Plan Prescribed Dose Per Fraction: 2.66 Gy
Plan Total Fractions Prescribed: 16
Plan Total Prescribed Dose: 42.56 Gy
Reference Point Dosage Given to Date: 42.56 Gy
Reference Point Session Dosage Given: 2.66 Gy
Session Number: 16

## 2021-06-15 ENCOUNTER — Inpatient Hospital Stay: Payer: Medicare Other

## 2021-06-15 ENCOUNTER — Ambulatory Visit
Admission: RE | Admit: 2021-06-15 | Discharge: 2021-06-15 | Disposition: A | Discharge status: Home / Self Care | Payer: Medicare Other | Source: Ambulatory Visit | Attending: Radiation Oncology | Admitting: Radiation Oncology

## 2021-06-15 ENCOUNTER — Other Ambulatory Visit: Payer: Self-pay

## 2021-06-15 DIAGNOSIS — D0512 Intraductal carcinoma in situ of left breast: Secondary | ICD-10-CM | POA: Diagnosis not present

## 2021-06-15 LAB — RAD ONC ARIA SESSION SUMMARY
Course Elapsed Days: 22
Plan Fractions Treated to Date: 1
Plan Prescribed Dose Per Fraction: 2 Gy
Plan Total Fractions Prescribed: 5
Plan Total Prescribed Dose: 10 Gy
Reference Point Dosage Given to Date: 44.56 Gy
Reference Point Session Dosage Given: 2 Gy
Session Number: 17

## 2021-06-16 ENCOUNTER — Encounter: Payer: Self-pay | Admitting: Intensive Care

## 2021-06-16 ENCOUNTER — Emergency Department: Payer: Medicare Other

## 2021-06-16 ENCOUNTER — Other Ambulatory Visit: Payer: Self-pay

## 2021-06-16 ENCOUNTER — Emergency Department
Admission: EM | Admit: 2021-06-16 | Discharge: 2021-06-16 | Disposition: A | Discharge status: Home / Self Care | Payer: Medicare Other | Attending: Emergency Medicine | Admitting: Emergency Medicine

## 2021-06-16 ENCOUNTER — Inpatient Hospital Stay: Payer: Medicare Other

## 2021-06-16 ENCOUNTER — Ambulatory Visit
Admission: RE | Admit: 2021-06-16 | Discharge: 2021-06-16 | Disposition: A | Discharge status: Home / Self Care | Payer: Medicare Other | Source: Ambulatory Visit | Attending: Radiation Oncology | Admitting: Radiation Oncology

## 2021-06-16 DIAGNOSIS — N179 Acute kidney failure, unspecified: Secondary | ICD-10-CM | POA: Diagnosis not present

## 2021-06-16 DIAGNOSIS — R42 Dizziness and giddiness: Secondary | ICD-10-CM | POA: Insufficient documentation

## 2021-06-16 DIAGNOSIS — I10 Essential (primary) hypertension: Secondary | ICD-10-CM | POA: Diagnosis not present

## 2021-06-16 DIAGNOSIS — R079 Chest pain, unspecified: Secondary | ICD-10-CM | POA: Diagnosis not present

## 2021-06-16 DIAGNOSIS — D649 Anemia, unspecified: Secondary | ICD-10-CM | POA: Diagnosis not present

## 2021-06-16 DIAGNOSIS — R911 Solitary pulmonary nodule: Secondary | ICD-10-CM | POA: Diagnosis not present

## 2021-06-16 DIAGNOSIS — Z853 Personal history of malignant neoplasm of breast: Secondary | ICD-10-CM | POA: Insufficient documentation

## 2021-06-16 DIAGNOSIS — J45909 Unspecified asthma, uncomplicated: Secondary | ICD-10-CM | POA: Insufficient documentation

## 2021-06-16 DIAGNOSIS — D0512 Intraductal carcinoma in situ of left breast: Secondary | ICD-10-CM | POA: Diagnosis not present

## 2021-06-16 LAB — BASIC METABOLIC PANEL
Anion gap: 10 (ref 5–15)
BUN: 16 mg/dL (ref 8–23)
CO2: 28 mmol/L (ref 22–32)
Calcium: 8.4 mg/dL — ABNORMAL LOW (ref 8.9–10.3)
Chloride: 104 mmol/L (ref 98–111)
Creatinine, Ser: 1.07 mg/dL — ABNORMAL HIGH (ref 0.44–1.00)
GFR, Estimated: 57 mL/min — ABNORMAL LOW (ref >60)
Glucose, Bld: 192 mg/dL — ABNORMAL HIGH (ref 70–99)
Potassium: 3.4 mmol/L — ABNORMAL LOW (ref 3.5–5.1)
Sodium: 142 mmol/L (ref 135–145)

## 2021-06-16 LAB — CBC
HCT: 37.3 % (ref 36.0–46.0)
Hemoglobin: 11.8 g/dL — ABNORMAL LOW (ref 12.0–15.0)
MCH: 28.9 pg (ref 26.0–34.0)
MCHC: 31.6 g/dL (ref 30.0–36.0)
MCV: 91.2 fL (ref 80.0–100.0)
Platelets: 311 10*3/uL (ref 150–400)
RBC: 4.09 MIL/uL (ref 3.87–5.11)
RDW: 12.8 % (ref 11.5–15.5)
WBC: 9 10*3/uL (ref 4.0–10.5)
nRBC: 0 % (ref 0.0–0.2)

## 2021-06-16 LAB — RAD ONC ARIA SESSION SUMMARY
Course Elapsed Days: 23
Plan Fractions Treated to Date: 2
Plan Prescribed Dose Per Fraction: 2 Gy
Plan Total Fractions Prescribed: 5
Plan Total Prescribed Dose: 10 Gy
Reference Point Dosage Given to Date: 46.56 Gy
Reference Point Session Dosage Given: 2 Gy
Session Number: 18

## 2021-06-16 LAB — TROPONIN I (HIGH SENSITIVITY)
Troponin I (High Sensitivity): 12 ng/L (ref <18)
Troponin I (High Sensitivity): 13 ng/L (ref <18)

## 2021-06-16 MED ORDER — IOHEXOL 350 MG/ML SOLN
80.0000 mL | Freq: Once | INTRAVENOUS | Status: AC | PRN
  Administered 2021-06-16: 80 mL via INTRAVENOUS
  Filled 2021-06-16: qty 80

## 2021-06-16 MED ORDER — IPRATROPIUM-ALBUTEROL 0.5-2.5 (3) MG/3ML IN SOLN
3.0000 mL | Freq: Once | RESPIRATORY_TRACT | Status: AC
  Administered 2021-06-16: 3 mL via RESPIRATORY_TRACT
  Filled 2021-06-16: qty 3

## 2021-06-16 NOTE — ED Notes (Signed)
E signature pad not working. Pt educated on discharge instructions and verbalized understanding.  

## 2021-06-16 NOTE — ED Notes (Signed)
Pt to first nurse desk asking how long before she would be seen. Pt informed that she should be one of the next few patients to go back based on wait time, but if something more severe comes it will go back before her. Pt states that she will wait a little longer before leaving.  ?

## 2021-06-16 NOTE — ED Notes (Signed)
Attempt IV start times 2  w/o success   ?

## 2021-06-16 NOTE — ED Notes (Signed)
Attempted x2 for IV. Will have another RN try for repeat trop.  ?

## 2021-06-16 NOTE — ED Notes (Signed)
Attempted for 20g IV x1.  ?

## 2021-06-16 NOTE — ED Triage Notes (Signed)
Patient c/o dizziness and hypertension. Arrived from cancer center after her radiation treatment and reported she started feeling dizzy so staff sent her to be checked out. A&O x4 during triage ?

## 2021-06-16 NOTE — ED Notes (Signed)
PT arrives to room via wheelchair with complaints of dizziness and chest tightness. Pt stating that "my pressure was high because I did not take my meds today. I was having a sugar attack but feel better now. I could go home." Pt asking to take inhaler upon arrival to room. Pt stating "my chest feels tight." ?

## 2021-06-16 NOTE — ED Provider Notes (Signed)
? ?[facility] ?Provider Note ? ? ? Event Date/Time  ? First MD Initiated Contact with Patient 06/16/21 1540   ?  (approximate) ? ? ?History  ? ?Chief Complaint ?Dizziness ? ? ?HPI ? ?Gail Stevenson is a 66 y.o. female with past medical history of hypertension, hyperlipidemia, diabetes, asthma, hepatitis C, breast cancer, and trigeminal neuralgia who presents to the ED complaining of dizziness and chest pain.  Patient reports that last night she began to dizzy, which she states alternates between a lightheadedness and feeling like the room is spinning around her.  She went to her regularly scheduled oncology appointment earlier today, reported feeling dizzy and like she was going to pass out while there.  During this time, she states she had onset of sharp pain in the center of her chest that is worse when she takes a deep breath.  She has been dealing with a dry cough and complains of mild difficulty breathing, denies any recent fevers.  She has not noticed any recent pain or swelling in her legs and denies any history of DVT/PE.  She does state that her chest feels tight at times and she used her albuterol inhaler without significant relief.  She was referred from her oncologist office to the ED for further evaluation.  She denies any vision changes, speech changes, numbness, or weakness. ?  ? ? ?Physical Exam  ? ?Triage Vital Signs: ?ED Triage Vitals  ?Enc Vitals Group  ?   BP 06/16/21 1346 (!) 179/84  ?   Pulse Rate 06/16/21 1346 86  ?   Resp 06/16/21 1346 20  ?   Temp 06/16/21 1346 98.1 ?F (36.7 ?C)  ?   Temp Source 06/16/21 1346 Oral  ?   SpO2 06/16/21 1346 100 %  ?   Weight 06/16/21 1346 188 lb (85.3 kg)  ?   Height 06/16/21 1346 '5\' 2"'$  (1.575 m)  ?   Head Circumference --   ?   Peak Flow --   ?   Pain Score 06/16/21 1357 10  ?   Pain Loc --   ?   Pain Edu? --   ?   Excl. in [place]? --   ? ? ?Most recent vital signs: ?Vitals:  ? 06/16/21 1346 06/16/21 1722  ?BP: (!) 179/84 (!) 170/80   ?Pulse: 86 80  ?Resp: 20 20  ?Temp: 98.1 ?F (36.7 ?C)   ?SpO2: 100% 100%  ? ? ?Constitutional: Alert and oriented. ?Eyes: Conjunctivae are normal. ?Head: Atraumatic. ?Nose: No congestion/rhinnorhea. ?Mouth/Throat: Mucous membranes are moist.  ?Cardiovascular: Normal rate, regular rhythm. Grossly normal heart sounds.  2+ radial pulses bilaterally. ?Respiratory: Normal respiratory effort.  No retractions. Lungs CTAB.  Anterior chest wall tenderness to palpation noted.  Left breast surgical site is clean, dry, and intact without erythema, warmth, or drainage. ?Gastrointestinal: Soft and nontender. No distention. ?Musculoskeletal: No lower extremity tenderness nor edema.  ?Neurologic:  Normal speech and language. No gross focal neurologic deficits are appreciated. ? ? ? ?ED Results / Procedures / Treatments  ? ?Labs ?(all labs ordered are listed, but only abnormal results are displayed) ?Labs Reviewed  ?BASIC METABOLIC PANEL - Abnormal; Notable for the following components:  ?    Result Value  ? Potassium 3.4 (*)   ? Glucose, Bld 192 (*)   ? Creatinine, Ser 1.07 (*)   ? Calcium 8.4 (*)   ? GFR, Estimated 57 (*)   ? All other components within normal limits  ?CBC -  Abnormal; Notable for the following components:  ? Hemoglobin 11.8 (*)   ? All other components within normal limits  ?TROPONIN I (HIGH SENSITIVITY)  ?TROPONIN I (HIGH SENSITIVITY)  ? ? ? ?EKG ? ?ED ECG REPORT ?Tempie Hoist, the attending physician, personally viewed and interpreted this ECG. ? ? Date: 06/16/2021 ? EKG Time: 13:49 ? Rate: 74 ? Rhythm: normal sinus rhythm ? Axis: LAD ? Intervals:none ? ST&T Change: LVH ? ?RADIOLOGY ?Chest x-ray reviewed by me with no infiltrate, edema, or effusion. ? ?PROCEDURES: ? ?Critical Care performed: No ? ?Procedures ? ? ?MEDICATIONS ORDERED IN ED: ?Medications  ?ipratropium-albuterol (DUONEB) 0.5-2.5 (3) MG/3ML nebulizer solution 3 mL (3 mLs Nebulization Given 06/16/21 1705)  ?iohexol (OMNIPAQUE) 350 MG/ML injection  80 mL (80 mLs Intravenous Contrast Given 06/16/21 1833)  ? ? ? ?IMPRESSION / MDM / ASSESSMENT AND PLAN / ED COURSE  ?I reviewed the triage vital signs and the nursing notes. ?             ?               ? ?66 y.o. female with past medical history of hypertension, hyperlipidemia, diabetes, asthma, hepatitis C, breast cancer, and trigeminal neuralgia who presents to the ED with intermittent dizziness and lightheadedness since last night associated with some pain and tightness in her chest today with mild difficulty breathing.   ? ?Differential diagnosis includes, but is not limited to, arrhythmia, ACS, PE, pneumonia, stroke, vertigo, vasovagal near syncope. ? ?Patient is well-appearing and in no acute distress, reports some difficulty breathing but is not in any respiratory distress and is maintaining O2 sats at 100% on room air.  EKG shows no evidence of arrhythmia or ischemia, but with patient's cancer history along with near syncope and chest pain, I would be concerned for PE.  We will further assess with CTA of her chest, initial troponin is negative and we will trend repeat.  Additional labs are reassuring with CBC showing mild anemia but no leukocytosis, BMP with mild AKI but no significant electrolyte abnormality.  Patient will also state that she feels like the room is spinning around her, but does not appear to have any focal neurologic deficits on exam.  Stroke is on the differential for her dizziness but considered less likely given she feels primarily lightheaded.  We will check CT of her head along with her CTA.  Plan to hydrate with IV fluids and reassess following imaging results. ? ?CT head is negative for acute process and CTA of chest is also unremarkable with no evidence of PE.  On reassessment, patient reports she is feeling better with minimal dizziness, she has been able to ambulate to the bathroom without difficulty.  Very low suspicion for stroke or other central cause of her dizziness as she  primarily seems to be dealing with lightheadedness.  Given 2 sets of troponin are negative, doubt ACS or cardiac etiology of her lightheadedness as well as her chest pain.  She continues to breathe comfortably on room air with saturations of 100%.  She is appropriate for discharge home with PCP and oncology follow-up, was counseled to return to the ED for new or worsening symptoms.  Patient agrees with plan. ? ?  ? ? ?FINAL CLINICAL IMPRESSION(S) / ED DIAGNOSES  ? ?Final diagnoses:  ?Dizziness  ?Chest pain, unspecified type  ?Lightheadedness  ?Pulmonary nodule  ? ? ? ?Rx / DC Orders  ? ?ED Discharge Orders   ? ? None  ? ?  ? ? ? ?  Note:  This document was prepared using Dragon voice recognition software and may include unintentional dictation errors. ?  ?Blake Divine, MD ?06/16/21 1919 ? ?

## 2021-06-19 ENCOUNTER — Ambulatory Visit
Admission: RE | Admit: 2021-06-19 | Discharge: 2021-06-19 | Disposition: A | Discharge status: Home / Self Care | Payer: Medicare Other | Source: Ambulatory Visit | Attending: Radiation Oncology | Admitting: Radiation Oncology

## 2021-06-19 ENCOUNTER — Inpatient Hospital Stay: Payer: Medicare Other

## 2021-06-19 ENCOUNTER — Other Ambulatory Visit: Payer: Self-pay

## 2021-06-19 DIAGNOSIS — D0512 Intraductal carcinoma in situ of left breast: Secondary | ICD-10-CM | POA: Diagnosis not present

## 2021-06-19 LAB — RAD ONC ARIA SESSION SUMMARY
Course Elapsed Days: 26
Plan Fractions Treated to Date: 3
Plan Prescribed Dose Per Fraction: 2 Gy
Plan Total Fractions Prescribed: 5
Plan Total Prescribed Dose: 10 Gy
Reference Point Dosage Given to Date: 48.56 Gy
Reference Point Session Dosage Given: 2 Gy
Session Number: 19

## 2021-06-20 ENCOUNTER — Ambulatory Visit
Admission: RE | Admit: 2021-06-20 | Discharge: 2021-06-20 | Disposition: A | Discharge status: Home / Self Care | Payer: Medicare Other | Source: Ambulatory Visit | Attending: Radiation Oncology | Admitting: Radiation Oncology

## 2021-06-20 ENCOUNTER — Inpatient Hospital Stay: Payer: Medicare Other

## 2021-06-20 ENCOUNTER — Other Ambulatory Visit: Payer: Self-pay | Admitting: *Deleted

## 2021-06-20 ENCOUNTER — Other Ambulatory Visit: Payer: Self-pay

## 2021-06-20 DIAGNOSIS — D0512 Intraductal carcinoma in situ of left breast: Secondary | ICD-10-CM | POA: Diagnosis not present

## 2021-06-20 LAB — RAD ONC ARIA SESSION SUMMARY
Course Elapsed Days: 27
Plan Fractions Treated to Date: 4
Plan Prescribed Dose Per Fraction: 2 Gy
Plan Total Fractions Prescribed: 5
Plan Total Prescribed Dose: 10 Gy
Reference Point Dosage Given to Date: 50.56 Gy
Reference Point Session Dosage Given: 2 Gy
Session Number: 20

## 2021-06-20 MED ORDER — SILVER SULFADIAZINE 1 % EX CREA: 1.0000 "application " | TOPICAL_CREAM | Freq: Two times a day (BID) | CUTANEOUS | 0 refills | Status: DC

## 2021-06-21 ENCOUNTER — Inpatient Hospital Stay: Payer: Medicare Other

## 2021-06-21 ENCOUNTER — Other Ambulatory Visit: Payer: Self-pay

## 2021-06-21 ENCOUNTER — Ambulatory Visit
Admission: RE | Admit: 2021-06-21 | Discharge: 2021-06-21 | Disposition: A | Discharge status: Home / Self Care | Payer: Medicare Other | Source: Ambulatory Visit | Attending: Radiation Oncology | Admitting: Radiation Oncology

## 2021-06-21 DIAGNOSIS — D0512 Intraductal carcinoma in situ of left breast: Secondary | ICD-10-CM | POA: Diagnosis not present

## 2021-06-21 LAB — RAD ONC ARIA SESSION SUMMARY
Course Elapsed Days: 28
Plan Fractions Treated to Date: 5
Plan Prescribed Dose Per Fraction: 2 Gy
Plan Total Fractions Prescribed: 5
Plan Total Prescribed Dose: 10 Gy
Reference Point Dosage Given to Date: 52.56 Gy
Reference Point Session Dosage Given: 2 Gy
Session Number: 21

## 2021-06-22 ENCOUNTER — Inpatient Hospital Stay: Payer: Medicare Other

## 2021-06-22 ENCOUNTER — Ambulatory Visit: Payer: Medicare Other

## 2021-06-23 ENCOUNTER — Ambulatory Visit: Payer: Medicare Other

## 2021-06-23 ENCOUNTER — Inpatient Hospital Stay: Payer: Medicare Other

## 2021-06-26 ENCOUNTER — Other Ambulatory Visit: Payer: Self-pay

## 2021-06-26 ENCOUNTER — Ambulatory Visit: Payer: Medicare Other

## 2021-06-26 ENCOUNTER — Emergency Department: Payer: Medicare Other

## 2021-06-26 ENCOUNTER — Emergency Department
Admission: EM | Admit: 2021-06-26 | Discharge: 2021-06-26 | Disposition: A | Discharge status: Home / Self Care | Payer: Medicare Other | Attending: Emergency Medicine | Admitting: Emergency Medicine

## 2021-06-26 ENCOUNTER — Inpatient Hospital Stay: Payer: Medicare Other

## 2021-06-26 DIAGNOSIS — E119 Type 2 diabetes mellitus without complications: Secondary | ICD-10-CM | POA: Diagnosis not present

## 2021-06-26 DIAGNOSIS — I10 Essential (primary) hypertension: Secondary | ICD-10-CM | POA: Insufficient documentation

## 2021-06-26 DIAGNOSIS — R079 Chest pain, unspecified: Secondary | ICD-10-CM

## 2021-06-26 DIAGNOSIS — Z853 Personal history of malignant neoplasm of breast: Secondary | ICD-10-CM | POA: Insufficient documentation

## 2021-06-26 DIAGNOSIS — J4540 Moderate persistent asthma, uncomplicated: Secondary | ICD-10-CM | POA: Insufficient documentation

## 2021-06-26 DIAGNOSIS — J45901 Unspecified asthma with (acute) exacerbation: Secondary | ICD-10-CM

## 2021-06-26 LAB — BASIC METABOLIC PANEL
Anion gap: 8 (ref 5–15)
BUN: 11 mg/dL (ref 8–23)
CO2: 30 mmol/L (ref 22–32)
Calcium: 8.5 mg/dL — ABNORMAL LOW (ref 8.9–10.3)
Chloride: 102 mmol/L (ref 98–111)
Creatinine, Ser: 1 mg/dL (ref 0.44–1.00)
GFR, Estimated: >60 mL/min (ref >60)
Glucose, Bld: 118 mg/dL — ABNORMAL HIGH (ref 70–99)
Potassium: 3.1 mmol/L — ABNORMAL LOW (ref 3.5–5.1)
Sodium: 140 mmol/L (ref 135–145)

## 2021-06-26 LAB — CBC
HCT: 37.3 % (ref 36.0–46.0)
Hemoglobin: 11.9 g/dL — ABNORMAL LOW (ref 12.0–15.0)
MCH: 28.9 pg (ref 26.0–34.0)
MCHC: 31.9 g/dL (ref 30.0–36.0)
MCV: 90.5 fL (ref 80.0–100.0)
Platelets: 315 10*3/uL (ref 150–400)
RBC: 4.12 MIL/uL (ref 3.87–5.11)
RDW: 12.6 % (ref 11.5–15.5)
WBC: 9.5 10*3/uL (ref 4.0–10.5)
nRBC: 0 % (ref 0.0–0.2)

## 2021-06-26 LAB — TROPONIN I (HIGH SENSITIVITY)
Troponin I (High Sensitivity): 10 ng/L (ref <18)
Troponin I (High Sensitivity): 11 ng/L (ref <18)

## 2021-06-26 LAB — CBG MONITORING, ED: Glucose-Capillary: 164 mg/dL — ABNORMAL HIGH (ref 70–99)

## 2021-06-26 MED ORDER — IPRATROPIUM-ALBUTEROL 0.5-2.5 (3) MG/3ML IN SOLN
3.0000 mL | Freq: Once | RESPIRATORY_TRACT | Status: AC
  Administered 2021-06-26: 3 mL via RESPIRATORY_TRACT
  Filled 2021-06-26: qty 3

## 2021-06-26 MED ORDER — PREDNISOLONE SODIUM PHOSPHATE 15 MG/5ML PO SOLN: 60.0000 mg | Freq: Every day | ORAL | 0 refills | Status: AC

## 2021-06-26 MED ORDER — PREDNISOLONE SODIUM PHOSPHATE 15 MG/5ML PO SOLN
60.0000 mg | Freq: Once | ORAL | Status: AC
  Administered 2021-06-26: 60 mg via ORAL
  Filled 2021-06-26: qty 4

## 2021-06-26 MED ORDER — POTASSIUM CHLORIDE 20 MEQ PO PACK
40.0000 meq | PACK | Freq: Once | ORAL | Status: AC
  Administered 2021-06-26: 40 meq via ORAL
  Filled 2021-06-26: qty 2

## 2021-06-26 MED ORDER — OXYCODONE-ACETAMINOPHEN 5-325 MG PO TABS: 1.0000 | ORAL_TABLET | Freq: Once | ORAL | Status: DC

## 2021-06-26 MED ORDER — POTASSIUM CHLORIDE 20 MEQ PO PACK: 20.0000 meq | PACK | Freq: Two times a day (BID) | ORAL | 0 refills | Status: DC

## 2021-06-26 MED ORDER — OXYCODONE HCL 5 MG/5ML PO SOLN
5.0000 mg | Freq: Once | ORAL | Status: AC
  Administered 2021-06-26: 5 mg via ORAL

## 2021-06-26 NOTE — ED Notes (Signed)
Pt A&O, IV removed, pt given discharge instructions, pt ambulating with steady gait. 
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
## 2021-06-26 NOTE — ED Triage Notes (Signed)
Pt comes into the ED via EMS from [facility] with c/o SOB and chest pain since last night, they gave 2 duonebs, pt a/ox4 ?#20gLAC ?98%RA ?PG98 ?[id number] ?204/88 ? ?

## 2021-06-26 NOTE — Discharge Instructions (Signed)
Your chest x-ray and blood work were all reassuring.  If your chest pain is worsening or you develop more shortness of breath please return to the emergency department.  Please continue to use your inhalers I would use your albuterol several puffs every 4 hours.  I have also written you a prescription for a steroid which she should take for the next 5 days.  Please follow-up with your oncologist as scheduled. ?

## 2021-06-26 NOTE — ED Provider Triage Note (Signed)
Emergency Medicine Provider Triage Evaluation Note ? ?Gail Stevenson , a 66 y.o. female  was evaluated in triage.  Pt complains of shortness of breath some chest pain. ? ?Review of Systems  ?Positive: Chest pain, shortness of breath ?Negative: Fever, chills ? ?Physical Exam  ?BP (!) 170/102 (BP Location: Right Arm)   Pulse 80   Temp 97.9 ?F (36.6 ?C) (Oral)   Resp 20   SpO2 98%  ?Gen:   Awake, no distress   ?Resp:  Normal effort, patient has expiratory wheezing ?MSK:   Moves extremities without difficulty  ?Other:   ? ?Medical Decision Making  ?Medically screening exam initiated at 1:49 PM.  Appropriate orders placed.  Gail Stevenson was informed that the remainder of the evaluation will be completed by another provider, this initial triage assessment does not replace that evaluation, and the importance of remaining in the ED until their evaluation is complete. ? ?Patient was given a breathing treatment at urgent care earlier. ?  ?Versie Starks, PA-C ?06/26/21 1350 ? ?

## 2021-06-27 ENCOUNTER — Ambulatory Visit: Payer: Medicare Other

## 2021-06-27 ENCOUNTER — Inpatient Hospital Stay: Payer: Medicare Other

## 2021-06-28 ENCOUNTER — Inpatient Hospital Stay: Payer: Medicare Other

## 2021-06-28 ENCOUNTER — Ambulatory Visit: Payer: Medicare Other

## 2021-06-29 ENCOUNTER — Ambulatory Visit: Payer: Medicare Other

## 2021-06-29 ENCOUNTER — Inpatient Hospital Stay: Payer: Medicare Other

## 2021-06-30 ENCOUNTER — Inpatient Hospital Stay: Payer: Medicare Other

## 2021-06-30 ENCOUNTER — Ambulatory Visit: Payer: Medicare Other

## 2021-07-03 ENCOUNTER — Ambulatory Visit: Payer: Medicare Other

## 2021-07-03 ENCOUNTER — Inpatient Hospital Stay: Payer: Medicare Other

## 2021-07-04 ENCOUNTER — Inpatient Hospital Stay: Payer: Medicare Other

## 2021-07-04 ENCOUNTER — Ambulatory Visit: Payer: Medicare Other

## 2021-07-05 ENCOUNTER — Ambulatory Visit: Payer: Medicare Other

## 2021-07-05 ENCOUNTER — Ambulatory Visit (INDEPENDENT_AMBULATORY_CARE_PROVIDER_SITE_OTHER): Payer: Medicare Other | Admitting: Surgery

## 2021-07-05 ENCOUNTER — Inpatient Hospital Stay: Payer: Medicare Other

## 2021-07-05 ENCOUNTER — Encounter: Payer: Self-pay | Admitting: Surgery

## 2021-07-05 VITALS — BP 177/101 | HR 92 | Temp 98.1°F | Wt 190.2 lb

## 2021-07-05 DIAGNOSIS — L723 Sebaceous cyst: Secondary | ICD-10-CM

## 2021-07-05 DIAGNOSIS — Z09 Encounter for follow-up examination after completed treatment for conditions other than malignant neoplasm: Secondary | ICD-10-CM

## 2021-07-05 DIAGNOSIS — D0512 Intraductal carcinoma in situ of left breast: Secondary | ICD-10-CM

## 2021-07-05 NOTE — Patient Instructions (Signed)
If you have any concerns or questions, please feel free to call our office.   Breast Self-Awareness Breast self-awareness is knowing how your breasts look and feel. You need to: Check your breasts on a regular basis. Tell your doctor about any changes. Become familiar with the look and feel of your breasts. This can help you catch a breast problem while it is still small and can be treated. You should do breast self-exams even if you have breast implants. What you need: A mirror. A well-lit room. A pillow or other soft object. How to do a breast self-exam Follow these steps to do a breast self-exam: Look for changes  Take off all the clothes above your waist. Stand in front of a mirror in a room with good lighting. Put your hands down at your sides. Compare your breasts in the mirror. Look for any difference between them, such as: A difference in shape. A difference in size. Wrinkles, dips, and bumps in one breast and not the other. Look at each breast for changes in the skin, such as: Redness. Scaly areas. Skin that has gotten thicker. Dimpling. Open sores (ulcers). Look for changes in your nipples, such as: Fluid coming out of a nipple. Fluid around a nipple. Bleeding. Dimpling. Redness. A nipple that looks pushed in (retracted), or that has changed position. Feel for changes Lie on your back. Feel each breast. To do this: Pick a breast to feel. Place a pillow under the shoulder closest to that breast. Put the arm closest to that breast behind your head. Feel the nipple area of that breast using the hand of your other arm. Feel the area with the pads of your three middle fingers by making small circles with your fingers. Use light, medium, and firm pressure. Continue the overlapping circles, moving downward over the breast. Keep making circles with your fingers. Stop when you feel your ribs. Start making circles with your fingers again, this time going upward until you  reach your collarbone. Then, make circles outward across your breast and into your armpit area. Squeeze your nipple. Check for discharge and lumps. Repeat these steps to check your other breast. Sit or stand in the tub or shower. With soapy water on your skin, feel each breast the same way you did when you were lying down. Write down what you find Writing down what you find can help you remember what to tell your doctor. Write down: What is normal for each breast. Any changes you find in each breast. These include: The kind of changes you find. A tender or painful breast. Any lump you find. Write down its size and where it is. When you last had your monthly period (menstrual cycle). General tips If you are breastfeeding, the best time to check your breasts is after you feed your baby or after you use a breast pump. If you get monthly bleeding, the best time to check your breasts is 5-7 days after your monthly cycle ends. With time, you will become comfortable with the self-exam. You will also start to know if there are changes in your breasts. Contact a doctor if: You see a change in the shape or size of your breasts or nipples. You see a change in the skin of your breast or nipples, such as red or scaly skin. You have fluid coming from your nipples that is not normal. You find a new lump or thick area. You have breast pain. You have any concerns about your   breast health. Summary Breast self-awareness includes looking for changes in your breasts and feeling for changes within your breasts. You should do breast self-awareness in front of a mirror in a well-lit room. If you get monthly periods (menstrual cycles), the best time to check your breasts is 5-7 days after your period ends. Tell your doctor about any changes you see in your breasts. Changes include changes in size, changes on the skin, painful or tender breasts, or fluid from your nipples that is not normal. This information is  not intended to replace advice given to you by your health care provider. Make sure you discuss any questions you have with your health care provider. Document Revised: 12/01/2020 Document Reviewed: 12/01/2020 Elsevier Patient Education  2023 Elsevier Inc.  

## 2021-07-06 ENCOUNTER — Ambulatory Visit: Payer: Medicare Other

## 2021-07-06 ENCOUNTER — Encounter: Payer: Self-pay | Admitting: Surgery

## 2021-07-06 ENCOUNTER — Inpatient Hospital Stay: Payer: Medicare Other

## 2021-07-06 NOTE — Progress Notes (Addendum)
07/05/2021  History of Present Illness: Gail Stevenson is a 66 y.o. female presenting for evaluation of a few issues.  She's s/p left breast lumpectomy on 04/27/21 for a retroareolar mass, final pathology showing DCIS.  She just recently completed radiation therapy.  She has been to Urgent Care and the ED twice this month.  On 06/16/21, she presented with dizziness and sharp chest pain when taking a deep breath.  CTA was done which was negative for PE, cardiac workup was negative, and was discharged home.  On 06/23/21, she presented to Urgent Care with foul smelling urine and bumps in both groins.  She was thought to have lymphadenopathy and was referred to her oncology team and was treated for her UTI.  She presented again to Urgent Care on 5/15 with shortness of breath and intermittent chest pain.  She was treated with Duonebs and was sent to the ED for further eval.  In the ED, she was noted to have skin erosion under her left breast which was thought could be a reason for her pain.  She was treated with steroids and Duonebs and discharged home.  Today, she reports she's doing well.  She reports that the skin of her breast and axilla has changed color and is darker now, and confirms the change under her left breast which was noted in the ED.  She also reports having bumps in her bilateral groins.  Denies any worsening pain, drainage, erythema, or induration, and denies any current shortness of breath.   Past Medical History: Past Medical History:  Diagnosis Date   Asthma    Breast cancer ([place])    Diabetes mellitus without complication ([place])    Type 2   Diastolic dysfunction    Diverticulitis    Esophageal varices without bleeding ([facility])    Fothergill's neuralgia    Hemorrhoids    Hepatitis C    History of breast cancer 2003   right side total mastectomy- DCIS   Hypertension    Leg swelling    Leukocytosis    LVH (left ventricular hypertrophy)    Morbid obesity ([place])    Osteopenia     Personal history of chemotherapy    Sepsis ([place])    SIRS (systemic inflammatory response syndrome) ([place])    Thrombocytosis    Trigeminal neuralgia      Past Surgical History: Past Surgical History:  Procedure Laterality Date   BREAST BIOPSY Left 03/14/2021   [place] bx ribbon marker,differential diagnosisi of intraductal papilloma with ADH or DCIS   BREAST LUMPECTOMY WITH RADIOFREQUENCY TAG IDENTIFICATION Left 0/17/5102   Procedure: BREAST LUMPECTOMY WITH RADIOFREQUENCY TAG IDENTIFICATION;  Surgeon: Olean Ree, MD;  Location: [facility];  Service: General;  Laterality: Left;   BREAST SURGERY Right 2003   total mastectomy   COLONOSCOPY  2019   LAPAROTOMY N/A 01/25/2019   Procedure: EXPLORATORY LAPAROTOMY, ILEOCECECTOMY;  Surgeon: Georganna Skeans, MD;  Location: [facility];  Service: General;  Laterality: N/A;   MASTECTOMY Right 2003   total   UPPER GI ENDOSCOPY      Home Medications: Prior to Admission medications   Medication Sig Start Date End Date Taking? Authorizing Provider  acetaminophen (TYLENOL) 160 MG/5ML liquid Take 500 mg by mouth every 4 (four) hours as needed for fever.   Yes [provider]  acetaminophen (TYLENOL) 500 MG tablet Take 2 tablets (1,000 mg total) by mouth every 6 (six) hours as needed for mild pain. 04/27/21  Yes Olean Ree, MD  aspirin EC 81  MG tablet Take 81 mg by mouth daily.   Yes [provider]  atorvastatin (LIPITOR) 40 MG tablet Take 40 mg by mouth at bedtime.    Yes [provider]  carvedilol (COREG) 12.5 MG tablet Take 12.5 mg by mouth 2 (two) times daily. 03/20/21  Yes [provider]  ENTRESTO 24-26 MG Take 1 tablet by mouth 2 (two) times daily. 03/09/21  Yes [provider]  fluticasone-salmeterol (ADVAIR) 250-50 MCG/ACT AEPB Inhale 1 puff into the lungs in the morning and at bedtime.   Yes [provider]  gabapentin (NEURONTIN) 100 MG capsule Take 100 mg by mouth 2 (two) times daily. 03/19/21  Yes  [provider]  ibuprofen (ADVIL) 600 MG tablet Take 1 tablet (600 mg total) by mouth every 8 (eight) hours as needed for moderate pain. 04/27/21  Yes Jashae Wiggs, MD  LANTUS SOLOSTAR 100 UNIT/ML Solostar Pen Inject 56 Units into the skin at bedtime.   Yes [provider]  levalbuterol (XOPENEX HFA) 45 MCG/ACT inhaler Inhale 2 puffs into the lungs every 4 (four) hours as needed for wheezing.   Yes [provider]  loperamide (IMODIUM) 2 MG capsule Take 2 mg by mouth 4 (four) times daily as needed for diarrhea or loose stools. 03/27/21  Yes [provider]  montelukast (SINGULAIR) 10 MG tablet Take 10 mg by mouth at bedtime. 12/26/18  Yes [provider]  Multiple Vitamin (MULTIVITAMIN WITH MINERALS) TABS tablet Take 4 tablets by mouth daily.   Yes [provider]  omeprazole (PRILOSEC) 20 MG capsule Take 20 mg by mouth daily. 01/16/21  Yes [provider]  PROAIR HFA 108 (90 Base) MCG/ACT inhaler Inhale 2 puffs into the lungs every 6 (six) hours as needed for wheezing or shortness of breath.    Yes [provider]  silver sulfADIAZINE (SILVADENE) 1 % cream Apply 1 application. topically 2 (two) times daily. 06/20/21  Yes Chrystal, Eulas Post, MD  potassium chloride (KLOR-CON) 20 MEQ packet Take 20 mEq by mouth 2 (two) times daily for 5 days. 06/26/21 07/01/21  Rada Hay, MD    Allergies: Allergies  Allergen Reactions   Metformin Other (See Comments) and Swelling    Unable to walk   Patanol [Olopatadine]     Unknown reaction    Paroxetine Hcl Rash    Review of Systems: Review of Systems  Constitutional:  Negative for chills and fever.  Respiratory:  Negative for shortness of breath.   Cardiovascular:  Negative for chest pain.  Gastrointestinal:  Negative for abdominal pain, nausea and vomiting.  Skin:        Bumps in her groins   Physical Exam BP (!) 177/101   Pulse 92   Temp 98.1 F (36.7 C) (Oral)   Wt 190  lb 3.2 oz (86.3 kg)   SpO2 96%   BMI 34.79 kg/m  CONSTITUTIONAL: No acute distress HEENT:  Normocephalic, atraumatic, extraocular motion intact. RESPIRATORY:  Lungs are clear, and breath sounds are equal bilaterally. Normal respiratory effort without pathologic use of accessory muscles. CARDIOVASCULAR: Heart is regular without murmurs, gallops, or rubs. BREAST:  Right breast s/p mastectomy with well healed scar.  Left breast with healed periareolar incision, with notable post-radiation skin changes, mostly some thickening and darkening of her skin.  At the mid portion of the inframammary fold, there is moisture and healing superficial skin erosions consistent with moisture changes.  No redness to indicate active yeast infection.  No left axillary lymphadenopathy, but  also has skin color changes in the axilla. MUSCULOSKELETAL:  In the lower groin bilaterally, the patient has two small sebaceous cysts, measuring < 1 cm each, one on either groin.  These are soft, non-tender, without any erythema or induration. NEUROLOGIC:  Motor and sensation is grossly normal.  Cranial nerves are grossly intact. PSYCH:  Alert and oriented to person, place and time. Affect is normal.  Labs/Imaging: CTA Chest 06/16/21: IMPRESSION: No evidence of pulmonary embolism or other active disease.   Aortic Atherosclerosis (ICD10-I70.0).  CXR 06/26/21: IMPRESSION: No acute cardiopulmonary disease.  Assessment and Plan: This is a 66 y.o. female s/p left breast lumpectomy with recent episodes of chest pain and shortness of breath.  --From the breast standpoint, she has skin changes consistent with radiation changes currently.  She just recently completed radiation therapy.  The inferior portion which as the moisture changes appears to be healing.  Discussed the importance of keeping that dry and she's using dry gauze dressing changes and she reports that's improving. --From the groin standpoint, she has sebaceous cysts,  without any concern on exam today for lymphadenopathy.  Discussed with her that the cysts are benign and we do not need to do anything with them, but if she wants we can do excision in the future. --She will be due for mammogram in June based on her last screening mammogram, but given her recent surgery and radiation, will push her mammogram for August/September, and she will follow up with me afterwards.  I spent 25 minutes dedicated to the care of this patient on the date of this encounter to include pre-visit review of records, face-to-face time with the patient discussing diagnosis and management, and any post-visit coordination of care.   Melvyn Neth, [facility]

## 2021-07-07 ENCOUNTER — Inpatient Hospital Stay: Payer: Medicare Other

## 2021-07-07 ENCOUNTER — Ambulatory Visit: Payer: Medicare Other

## 2021-07-27 ENCOUNTER — Ambulatory Visit
Admission: RE | Admit: 2021-07-27 | Discharge: 2021-07-27 | Disposition: A | Discharge status: Home / Self Care | Payer: Medicare Other | Source: Ambulatory Visit | Attending: Radiation Oncology | Admitting: Radiation Oncology

## 2021-07-27 ENCOUNTER — Inpatient Hospital Stay: Payer: Medicare Other | Attending: Oncology

## 2021-07-27 ENCOUNTER — Encounter: Payer: Self-pay | Admitting: Radiation Oncology

## 2021-07-27 VITALS — BP 160/94 | HR 88 | Temp 98.3°F | Resp 16 | Wt 192.4 lb

## 2021-07-27 DIAGNOSIS — Z923 Personal history of irradiation: Secondary | ICD-10-CM | POA: Diagnosis not present

## 2021-07-27 DIAGNOSIS — F419 Anxiety disorder, unspecified: Secondary | ICD-10-CM | POA: Insufficient documentation

## 2021-07-27 DIAGNOSIS — D75839 Thrombocytosis, unspecified: Secondary | ICD-10-CM | POA: Insufficient documentation

## 2021-07-27 DIAGNOSIS — Z79899 Other long term (current) drug therapy: Secondary | ICD-10-CM | POA: Insufficient documentation

## 2021-07-27 DIAGNOSIS — Z170 Estrogen receptor positive status [ER+]: Secondary | ICD-10-CM | POA: Insufficient documentation

## 2021-07-27 DIAGNOSIS — Z87891 Personal history of nicotine dependence: Secondary | ICD-10-CM | POA: Insufficient documentation

## 2021-07-27 DIAGNOSIS — Z7982 Long term (current) use of aspirin: Secondary | ICD-10-CM | POA: Insufficient documentation

## 2021-07-27 DIAGNOSIS — D0512 Intraductal carcinoma in situ of left breast: Secondary | ICD-10-CM | POA: Insufficient documentation

## 2021-07-27 DIAGNOSIS — Z853 Personal history of malignant neoplasm of breast: Secondary | ICD-10-CM | POA: Insufficient documentation

## 2021-07-27 NOTE — Progress Notes (Signed)
Radiation Oncology Follow up Note  Name: Gail Stevenson   Date:   07/27/2021 MRN:  [id number] DOB: 1955/03/12    This 66 y.o. female presents to the clinic today for 1 month follow-up status post whole breast radiation to her left breast for ER positive ductal carcinoma in situ.  REFERRING PROVIDER: Jonelle Sports, MD  HPI: Patient is a 66 year old female now at 1 month having completed whole breast radiation to her left breast for ER positive ductal carcinoma in situ status post wide local excision.  Seen today in routine follow-up she is doing well still has some slight tenderness in the breast which we would expect.  Also has some significant hyperpigmentation although that is resolving.  She otherwise specifically denies cough or bone pain.  She has not yet been started on endocrine therapy..  COMPLICATIONS OF TREATMENT: none  FOLLOW UP COMPLIANCE: keeps appointments   PHYSICAL EXAM:  BP (!) 160/94 (BP Location: Left Arm, Patient Position: Sitting)   Pulse 88   Temp 98.3 F (36.8 C) (Tympanic)   Resp 16   Wt 192 lb 6.4 oz (87.3 kg)   SpO2 99%   BMI 35.19 kg/m  Lungs are clear to A&P cardiac examination essentially unremarkable with regular rate and rhythm. No dominant mass or nodularity is noted in either breast in 2 positions examined. Incision is well-healed. No axillary or supraclavicular adenopathy is appreciated. Cosmetic result is excellent.  Still some persistent hyperpigmentation of the skin which is resolving.  Well-developed well-nourished patient in NAD. HEENT reveals PERLA, EOMI, discs not visualized.  Oral cavity is clear. No oral mucosal lesions are identified. Neck is clear without evidence of cervical or supraclavicular adenopathy. Lungs are clear to A&P. Cardiac examination is essentially unremarkable with regular rate and rhythm without murmur rub or thrill. Abdomen is benign with no organomegaly or masses noted. Motor sensory and DTR levels are equal and  symmetric in the upper and lower extremities. Cranial nerves II through XII are grossly intact. Proprioception is intact. No peripheral adenopathy or edema is identified. No motor or sensory levels are noted. Crude visual fields are within normal range.  RADIOLOGY RESULTS: No current films for review  PLAN: Present time patient is doing well.  I assured her the hyperpigmentation will resolve over time.  I am scheduling a follow-up appoint with medical oncology to discuss endocrine therapy.  I have asked to see her back in 4 to 5 months for follow-up.  Patient knows to call with any concerns.  I would like to take this opportunity to thank you for allowing me to participate in the care of your patient.Noreene Filbert, MD

## 2021-07-31 ENCOUNTER — Inpatient Hospital Stay: Payer: Medicare Other

## 2021-07-31 ENCOUNTER — Inpatient Hospital Stay (HOSPITAL_BASED_OUTPATIENT_CLINIC_OR_DEPARTMENT_OTHER): Payer: Medicare Other | Admitting: Internal Medicine

## 2021-07-31 ENCOUNTER — Encounter: Payer: Self-pay | Admitting: Internal Medicine

## 2021-07-31 DIAGNOSIS — Z87891 Personal history of nicotine dependence: Secondary | ICD-10-CM | POA: Diagnosis not present

## 2021-07-31 DIAGNOSIS — Z79899 Other long term (current) drug therapy: Secondary | ICD-10-CM | POA: Diagnosis not present

## 2021-07-31 DIAGNOSIS — Z170 Estrogen receptor positive status [ER+]: Secondary | ICD-10-CM | POA: Diagnosis not present

## 2021-07-31 DIAGNOSIS — D0512 Intraductal carcinoma in situ of left breast: Secondary | ICD-10-CM

## 2021-07-31 DIAGNOSIS — Z7982 Long term (current) use of aspirin: Secondary | ICD-10-CM | POA: Diagnosis not present

## 2021-07-31 DIAGNOSIS — F419 Anxiety disorder, unspecified: Secondary | ICD-10-CM | POA: Diagnosis not present

## 2021-07-31 DIAGNOSIS — Z923 Personal history of irradiation: Secondary | ICD-10-CM | POA: Diagnosis not present

## 2021-07-31 DIAGNOSIS — D75839 Thrombocytosis, unspecified: Secondary | ICD-10-CM | POA: Diagnosis present

## 2021-07-31 DIAGNOSIS — Z853 Personal history of malignant neoplasm of breast: Secondary | ICD-10-CM | POA: Diagnosis not present

## 2021-07-31 MED ORDER — ANASTROZOLE 1 MG PO TABS: 1.0000 mg | ORAL_TABLET | Freq: Every day | ORAL | 2 refills | Status: DC

## 2021-07-31 MED ORDER — SILVER SULFADIAZINE 1 % EX CREA: 1.0000 | TOPICAL_CREAM | Freq: Two times a day (BID) | CUTANEOUS | 1 refills | Status: DC

## 2021-07-31 NOTE — Progress Notes (Signed)
[facility] NOTE  Patient Care Team: Jonelle Sports, MD as PCP - General (Obstetrics and Gynecology) Theodore Demark, RN (Inactive) as Oncology Nurse Navigator Cammie Sickle, MD as Consulting Physician (Oncology)  CHIEF COMPLAINTS/PURPOSE OF CONSULTATION: DCIS/ Thrombocytosis.  Oncology History Overview Note   # DCIS LEFT [MAY 2023- s/p WBRT [Dr.Crystal.]; ER POSITIVE; JUNE 2023-start anastrozole.  # hx of breast cancer s/p mastectomy [[facility]]  # THROMBOCYTOSIS [NOv 2021 605 -platelets- ; Hb;10.5 white count-WNL]; MARCH 2022-JAK-2NEG; CALR-NEG.BCR-ABL-NEGATIVE.     Ductal carcinoma in situ (DCIS) of left breast  04/15/2015 Initial Diagnosis   Ductal carcinoma in situ (DCIS) of left breast   05/08/2021 Cancer Staging   Staging form: Breast, AJCC 7th Edition - Clinical stage from 05/08/2021: Stage 0 (Tis (DCIS), N0, M0) - Signed by Lloyd Huger, MD on 05/08/2021    HISTORY OF PRESENTING ILLNESS:  Gail Stevenson 66 y.o.  female pleasant prior history of breast cancer on the right side-s/p mastectomy; and currently with a new diagnosis of left DCIS is here for follow-up.  Patient finished radiation a month ago.  Complains of mild breast discomfort post radiation.  Denies any nausea vomiting.  Denies any fevers or chills. C/o hand and hip pain, feeling like needles in her feet. Denies any joint pains back pain.    Review of Systems  Constitutional:  Positive for malaise/fatigue. Negative for chills, diaphoresis, fever and weight loss.  HENT:  Negative for nosebleeds and sore throat.   Eyes:  Negative for double vision.  Respiratory:  Negative for cough, hemoptysis, sputum production, shortness of breath and wheezing.   Cardiovascular:  Negative for chest pain, palpitations, orthopnea and leg swelling.  Gastrointestinal:  Negative for abdominal pain, blood in stool, constipation, diarrhea, heartburn, melena, nausea and vomiting.  Genitourinary:   Negative for dysuria, frequency and urgency.  Musculoskeletal:  Positive for back pain and joint pain.  Skin: Negative.  Negative for itching and rash.  Neurological:  Negative for dizziness, tingling, focal weakness, weakness and headaches.  Endo/Heme/Allergies:  Does not bruise/bleed easily.  Psychiatric/Behavioral:  Negative for depression. The patient is not nervous/anxious and does not have insomnia.      MEDICAL HISTORY:  Past Medical History:  Diagnosis Date   Asthma    Breast cancer ([place])    Diabetes mellitus without complication ([place])    Type 2   Diastolic dysfunction    Diverticulitis    Esophageal varices without bleeding ([facility])    Fothergill's neuralgia    Hemorrhoids    Hepatitis C    History of breast cancer 2003   right side total mastectomy- DCIS   Hypertension    Leg swelling    Leukocytosis    LVH (left ventricular hypertrophy)    Morbid obesity ([place])    Osteopenia    Personal history of chemotherapy    Sepsis ([place])    SIRS (systemic inflammatory response syndrome) ([facility])    Thrombocytosis    Trigeminal neuralgia     SURGICAL HISTORY: Past Surgical History:  Procedure Laterality Date   BREAST BIOPSY Left 03/14/2021   [place] bx ribbon marker,differential diagnosisi of intraductal papilloma with ADH or DCIS   BREAST LUMPECTOMY WITH RADIOFREQUENCY TAG IDENTIFICATION Left 6/94/8546   Procedure: BREAST LUMPECTOMY WITH RADIOFREQUENCY TAG IDENTIFICATION;  Surgeon: Olean Ree, MD;  Location: [facility];  Service: General;  Laterality: Left;   BREAST SURGERY Right 2003   total mastectomy   COLONOSCOPY  2019   LAPAROTOMY N/A 01/25/2019  Procedure: EXPLORATORY LAPAROTOMY, ILEOCECECTOMY;  Surgeon: Georganna Skeans, MD;  Location: [facility];  Service: General;  Laterality: N/A;   MASTECTOMY Right 2003   total   UPPER GI ENDOSCOPY      SOCIAL HISTORY: Social History   Socioeconomic History   Marital status: Divorced    Spouse name: Not on file   Number of  children: 1   Years of education: Not on file   Highest education level: Not on file  Occupational History   Not on file  Tobacco Use   Smoking status: Former    Packs/day: 0.50    Years: 30.00    Total pack years: 15.00    Types: Cigarettes    Quit date: 05/17/1992    Years since quitting: 29.2   Smokeless tobacco: Never  Vaping Use   Vaping Use: Never used  Substance and Sexual Activity   Alcohol use: Not Currently    Comment: Occasional beer   Drug use: No   Sexual activity: Yes  Other Topics Concern   Not on file  Social History Narrative   Lives with deceased daughter's boyfriend and ex-husband in [place]; Daughter assists in medical care. Hx of smoking; recovering alcohol. Used to work-house keeping/construction.    Social Determinants of Health   Financial Resource Strain: Not on file  Food Insecurity: Not on file  Transportation Needs: Not on file  Physical Activity: Not on file  Stress: Not on file  Social Connections: Not on file  Intimate Partner Violence: Not on file    FAMILY HISTORY: Family History  Problem Relation Age of Onset   Gastric cancer Father    Breast cancer Maternal Aunt    Breast cancer Daughter    Hypertension Neg Hx    Stroke Neg Hx     ALLERGIES:  is allergic to metformin, patanol [olopatadine], and paroxetine hcl.  MEDICATIONS:  Current Outpatient Medications  Medication Sig Dispense Refill   anastrozole (ARIMIDEX) 1 MG tablet Take 1 tablet (1 mg total) by mouth daily. 90 tablet 2   aspirin EC 81 MG tablet Take 81 mg by mouth daily.     atorvastatin (LIPITOR) 40 MG tablet Take 40 mg by mouth at bedtime.      carvedilol (COREG) 12.5 MG tablet Take 12.5 mg by mouth 2 (two) times daily.     ENTRESTO 24-26 MG Take 1 tablet by mouth 2 (two) times daily.     fluticasone-salmeterol (ADVAIR) 250-50 MCG/ACT AEPB Inhale 1 puff into the lungs in the morning and at bedtime.     gabapentin (NEURONTIN) 100 MG capsule Take 100 mg by mouth 2  (two) times daily.     LANTUS SOLOSTAR 100 UNIT/ML Solostar Pen Inject 56 Units into the skin at bedtime.     levalbuterol (XOPENEX HFA) 45 MCG/ACT inhaler Inhale 2 puffs into the lungs every 4 (four) hours as needed for wheezing.     loperamide (IMODIUM) 2 MG capsule Take 2 mg by mouth 4 (four) times daily as needed for diarrhea or loose stools.     montelukast (SINGULAIR) 10 MG tablet Take 10 mg by mouth at bedtime.     Multiple Vitamin (MULTIVITAMIN WITH MINERALS) TABS tablet Take 4 tablets by mouth daily.     PROAIR HFA 108 (90 Base) MCG/ACT inhaler Inhale 2 puffs into the lungs every 6 (six) hours as needed for wheezing or shortness of breath.   5   acetaminophen (TYLENOL) 160 MG/5ML liquid Take 500 mg by mouth every 4 (  four) hours as needed for fever. (Patient not taking: Reported on 07/31/2021)     acetaminophen (TYLENOL) 500 MG tablet Take 2 tablets (1,000 mg total) by mouth every 6 (six) hours as needed for mild pain. (Patient not taking: Reported on 07/31/2021)     ibuprofen (ADVIL) 600 MG tablet Take 1 tablet (600 mg total) by mouth every 8 (eight) hours as needed for moderate pain. 60 tablet 1   omeprazole (PRILOSEC) 20 MG capsule Take 20 mg by mouth daily.     potassium chloride (KLOR-CON) 20 MEQ packet Take 20 mEq by mouth 2 (two) times daily for 5 days. (Patient not taking: Reported on 07/27/2021) 10 packet 0   silver sulfADIAZINE (SILVADENE) 1 % cream Apply 1 Application topically 2 (two) times daily. 50 g 1   No current facility-administered medications for this visit.     PHYSICAL EXAMINATION:   Vitals:   07/31/21 1018  BP: (!) 186/99  Pulse: 87  Temp: 99.4 F (37.4 C)  SpO2: 100%   Filed Weights   07/31/21 1018  Weight: 192 lb (87.1 kg)    Physical Exam Constitutional:      Comments: Ambulating independently.   HENT:     Head: Normocephalic and atraumatic.     Mouth/Throat:     Pharynx: No oropharyngeal exudate.  Eyes:     Pupils: Pupils are equal, round, and  reactive to light.  Cardiovascular:     Rate and Rhythm: Normal rate and regular rhythm.  Pulmonary:     Effort: Pulmonary effort is normal. No respiratory distress.     Breath sounds: Normal breath sounds. No wheezing.  Abdominal:     General: Bowel sounds are normal. There is no distension.     Palpations: Abdomen is soft. There is no mass.     Tenderness: There is no abdominal tenderness. There is no guarding or rebound.  Musculoskeletal:        General: No tenderness. Normal range of motion.     Cervical back: Normal range of motion and neck supple.  Skin:    General: Skin is warm.  Neurological:     Mental Status: She is alert and oriented to person, place, and time.  Psychiatric:        Mood and Affect: Affect normal.      LABORATORY DATA:  I have reviewed the data as listed Lab Results  Component Value Date   WBC 9.5 06/26/2021   HGB 11.9 (L) 06/26/2021   HCT 37.3 06/26/2021   MCV 90.5 06/26/2021   PLT 315 06/26/2021   Recent Labs    02/01/21 1622 04/21/21 1136 06/16/21 1347 06/26/21 1357  NA 138  --  142 140  K 3.3* 3.4* 3.4* 3.1*  CL 105  --  104 102  CO2 28  --  28 30  GLUCOSE 123*  --  192* 118*  BUN 14  --  16 11  CREATININE 0.85  --  1.07* 1.00  CALCIUM 8.7*  --  8.4* 8.5*  GFRNONAA >60  --  57* >60  PROT 7.6  --   --   --   ALBUMIN 3.8  --   --   --   AST 20  --   --   --   ALT 21  --   --   --   ALKPHOS 81  --   --   --   BILITOT 0.7  --   --   --  No results found.    Ductal carcinoma in situ (DCIS) of left breast # LEFT BREAST DCIS- ER POSITIVE s/p Lumpectomty [s/p WBRT]; start Anastrazole '1mg'$ /day.   # Discussed the mechanism of action of aromatase inhibitors-with blocking of estrogen to prevent breast cancer.  Also discussed the potential side effects including but not limited to arthralgias hot flashes and increased risk of osteoporosis. Will order BMD in 3 months or so. Ordered anastrazole.  Recommend for 5 years.  # Hx of  right breast cancer/DICS [[facility]] s/p mastectomy- Clinically no evidence of recurrence.   # Intermittent Thrombocytosis platelets 600 [NOV 2021]; repeat platelet count 407 [normal 400]-suspect reactive rather than primary bone marrow process.  JAK2; CAL mutation negative. Monitor for now.   # hx of smoking-consider lung cancer screening program if eligible.  #Genetics: Personal history of breast cancer; father with stomach cancer; daughter with breast cancer-will need genetic referral.  Genetic referral made.  #Social issues: Anxiety-recommend evaluation with Education officer, museum.  # DISPOSITION: # social worker referral re: social/financial issues # genetics referral re: breast cancer  # follow up in 3 months- MD; labs- cbc/cmp; BMD prior-- Dr.B       Cammie Sickle, MD 07/31/2021 11:21 AM

## 2021-07-31 NOTE — Assessment & Plan Note (Addendum)
#   LEFT BREAST DCIS- ER POSITIVE s/p Lumpectomty [s/p WBRT]; start Anastrazole '1mg'$ /day.   # Discussed the mechanism of action of aromatase inhibitors-with blocking of estrogen to prevent breast cancer.  Also discussed the potential side effects including but not limited to arthralgias hot flashes and increased risk of osteoporosis. Will order BMD in 3 months or so. Ordered anastrazole.  Recommend for 5 years.  # Hx of right breast cancer/DICS [[facility]] s/p mastectomy- Clinically no evidence of recurrence.   # Intermittent Thrombocytosis platelets 600 [NOV 2021]; repeat platelet count 407 [normal 400]-suspect reactive rather than primary bone marrow process.  JAK2; CAL mutation negative. Monitor for now.   # hx of smoking-consider lung cancer screening program if eligible.  #Genetics: Personal history of breast cancer; father with stomach cancer; daughter with breast cancer-will need genetic referral.  Genetic referral made.  #Social issues: Anxiety-recommend evaluation with Education officer, museum.  # DISPOSITION: # Education officer, museum referral re: social/financial issues # genetics referral re: breast cancer  # follow up in 3 months- MD; labs- cbc/cmp; BMD prior-- Dr.B

## 2021-07-31 NOTE — Progress Notes (Signed)
Pt would like to know if you can give her something for the diarrhea she's had for 2 weeks? Immodium isn't helping.  C/o hand and hip pain, feeling like needles in her feet.

## 2021-08-16 NOTE — Progress Notes (Unsigned)
REFERRING PROVIDER: Cammie Sickle, MD [place],  [place] [address]  PRIMARY PROVIDER:  Jonelle Sports, MD  PRIMARY REASON FOR VISIT:  1. Ductal carcinoma in situ (DCIS) of left breast   2. Family history of breast cancer   3. Family history of stomach cancer      HISTORY OF PRESENT ILLNESS:   Ms. Dula, a 66 y.o. female, was seen for a [facility] cancer genetics consultation at the request of Dr. Rogue Bussing due to a personal and family history of cancer.  Ms. Joffe presents to clinic today to discuss the possibility of a hereditary predisposition to cancer, genetic testing, and to further clarify her future cancer risks, as well as potential cancer risks for family members.   In 2003, at the age of 66, Ms. Dutan was diagnosed with right breast cancer that was treated with mastectomy. In 2023, at the age of 66, Ms. Garry was diagnosed with DCIS of the left breast.    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  # hx of breast cancer s/p mastectomy [[facility]]  # THROMBOCYTOSIS [NOv 2021 605 -platelets- ; Hb;10.5 white count-WNL]; MARCH 2022-JAK-2NEG; CALR-NEG.BCR-ABL-NEGATIVE.     Ductal carcinoma in situ (DCIS) of left breast  04/15/2015 Initial Diagnosis   Ductal carcinoma in situ (DCIS) of left breast   05/08/2021 Cancer Staging   Staging form: Breast, AJCC 7th Edition - Clinical stage from 05/08/2021: Stage 0 (Tis (DCIS), N0, M0) - Signed by Lloyd Huger, MD on 05/08/2021      RISK FACTORS:  Menarche was at age ***.  First live birth at age ***.  OCP use for approximately {Numbers 1-12 multi-select:20307} years.  Ovaries intact: {Yes/No-Ex:120004}.  Hysterectomy: {Yes/No-Ex:120004}.  Menopausal status: {Menopause:31378}.  HRT use: {Numbers 1-12 multi-select:20307} years. Colonoscopy: {Yes/No-Ex:120004}; {normal/abnormal/not  examined:14677}. Mammogram within the last year: {Yes/No-Ex:120004}. Number of breast biopsies: {Numbers 1-12 multi-select:20307}. Up to date with pelvic exams: {Yes/No-Ex:120004}. Any excessive radiation exposure in the past: {Yes/No-Ex:120004}  Past Medical History:  Diagnosis Date   Asthma    Breast cancer ([place])    Diabetes mellitus without complication ([place])    Type 2   Diastolic dysfunction    Diverticulitis    Esophageal varices without bleeding ([facility])    Fothergill's neuralgia    Hemorrhoids    Hepatitis C    History of breast cancer 2003   right side total mastectomy- DCIS   Hypertension    Leg swelling    Leukocytosis    LVH (left ventricular hypertrophy)    Morbid obesity ([place])    Osteopenia    Personal history of chemotherapy    Sepsis ([place])    SIRS (systemic inflammatory response syndrome) ([place])    Thrombocytosis    Trigeminal neuralgia     Past Surgical History:  Procedure Laterality Date   BREAST BIOPSY Left 03/14/2021   [place] bx ribbon marker,differential diagnosisi of intraductal papilloma with ADH or DCIS   BREAST LUMPECTOMY WITH RADIOFREQUENCY TAG IDENTIFICATION Left 5/36/6440   Procedure: BREAST LUMPECTOMY WITH RADIOFREQUENCY TAG IDENTIFICATION;  Surgeon: Olean Ree, MD;  Location: [facility];  Service: General;  Laterality: Left;   BREAST SURGERY Right 2003   total mastectomy   COLONOSCOPY  2019   LAPAROTOMY N/A 01/25/2019   Procedure: EXPLORATORY LAPAROTOMY, ILEOCECECTOMY;  Surgeon: Georganna Skeans, MD;  Location: [facility];  Service: General;  Laterality: N/A;   MASTECTOMY Right 2003  total   UPPER GI ENDOSCOPY      FAMILY HISTORY:  We obtained a detailed, 4-generation family history.  Significant diagnoses are listed below: Family History  Problem Relation Age of Onset   Gastric cancer Father    Breast cancer Maternal Aunt    Breast cancer Daughter    Hypertension Neg Hx    Stroke Neg Hx      Ms. Baxley is unaware of previous family  history of genetic testing for hereditary cancer risks. There is no reported Ashkenazi Jewish ancestry. There is no known consanguinity.  GENETIC COUNSELING ASSESSMENT: Ms. Smola is a 66 y.o. female with a personal and family history of breast cancer which is somewhat suggestive of a hereditary cancer syndrome and predisposition to cancer. We, therefore, discussed and recommended the following at today's visit.   DISCUSSION: We discussed that approximately 10% of breast cancer is hereditary. Most cases of hereditary breast cancer are associated with BRCA1/BRCA2 genes, although there are other genes associated with hereditary cancer as well. Cancers and risks are gene specific. We discussed that testing is beneficial for several reasons including knowing about cancer risks, identifying potential screening and risk-reduction options that may be appropriate, and to understand if other family members could be at risk for cancer and allow them to undergo genetic testing.   We reviewed the characteristics, features and inheritance patterns of hereditary cancer syndromes. We also discussed genetic testing, including the appropriate family members to test, the process of testing, insurance coverage and turn-around-time for results. We discussed the implications of a negative, positive and/or variant of uncertain significant result. We recommended Ms. Quentin Cornwall pursue genetic testing for the [facility] *** gene panel.   Based on Ms. Ferdig's personal and family history of cancer, she meets medical criteria for genetic testing. Despite that she meets criteria, she may still have an out of pocket cost. We discussed that if her out of pocket cost for testing is over $100, the laboratory will call and confirm whether she wants to proceed with testing.  If the out of pocket cost of testing is less than $100 she will be billed by the genetic testing laboratory.   PLAN: After considering the risks, benefits, and  limitations, Ms. Quentin Cornwall provided informed consent to pursue genetic testing and the blood sample was sent to [facility] for analysis of the *** panel. Results should be available within approximately {TAT TIME} weeks' time, at which point they will be disclosed by telephone to Ms. Estrin, as will any additional recommendations warranted by these results. Ms. Hata will receive a summary of her genetic counseling visit and a copy of her results once available. This information will also be available in Epic.   *** Despite our recommendation, Ms. Micale did not wish to pursue genetic testing at today's visit. We understand this decision and remain available to coordinate genetic testing at any time in the future. We, therefore, recommend Ms. Steines continue to follow the cancer screening guidelines given by her primary healthcare provider.  Ms. Depaoli questions were answered to her satisfaction today. Our contact information was provided should additional questions or concerns arise. Thank you for the referral and allowing [place] to share in the care of your patient.   Faith Rogue, MS, [facility] Genetic Counselor [email or web address] Phone: [phone]  The patient was seen for a total of *** minutes in face-to-face genetic counseling.  Dr. Grayland Ormond was available for discussion regarding this case.   _______________________________________________________________________ For Office Staff:  Number of people  involved in session: *** Was an Intern/ student involved with case: no

## 2021-08-17 ENCOUNTER — Inpatient Hospital Stay: Payer: Medicare Other

## 2021-08-17 ENCOUNTER — Encounter: Payer: Self-pay | Admitting: Licensed Clinical Social Worker

## 2021-08-17 ENCOUNTER — Inpatient Hospital Stay: Payer: Medicare Other | Attending: Oncology | Admitting: Licensed Clinical Social Worker

## 2021-08-17 DIAGNOSIS — Z853 Personal history of malignant neoplasm of breast: Secondary | ICD-10-CM | POA: Diagnosis not present

## 2021-08-17 DIAGNOSIS — Z800 Family history of malignant neoplasm of digestive organs: Secondary | ICD-10-CM | POA: Diagnosis not present

## 2021-08-17 DIAGNOSIS — D0512 Intraductal carcinoma in situ of left breast: Secondary | ICD-10-CM | POA: Diagnosis not present

## 2021-08-17 DIAGNOSIS — Z803 Family history of malignant neoplasm of breast: Secondary | ICD-10-CM

## 2021-09-05 ENCOUNTER — Telehealth: Payer: Self-pay | Admitting: Licensed Clinical Social Worker

## 2021-09-06 ENCOUNTER — Ambulatory Visit: Payer: Self-pay | Admitting: Licensed Clinical Social Worker

## 2021-09-06 ENCOUNTER — Encounter: Payer: Self-pay | Admitting: Licensed Clinical Social Worker

## 2021-09-06 DIAGNOSIS — Z1379 Encounter for other screening for genetic and chromosomal anomalies: Secondary | ICD-10-CM

## 2021-09-06 NOTE — Telephone Encounter (Signed)
Revealed negative genetic testing.  This normal result is reassuring and indicates that it is unlikely Gail Stevenson's cancer is due to a hereditary cause.  It is unlikely that there is an increased risk of another cancer due to a mutation in one of these genes.  However, genetic testing is not perfect, and cannot definitively rule out a hereditary cause.  It will be important for her to keep in contact with genetics to learn if any additional testing may be needed in the future.

## 2021-09-06 NOTE — Progress Notes (Signed)
HPI:  Ms. Binning was previously seen in the [place] clinic due to a personal and family history of cancer and concerns regarding a hereditary predisposition to cancer. Please refer to our prior cancer genetics clinic note for more information regarding our discussion, assessment and recommendations, at the time. Ms. Hornak recent genetic test results were disclosed to her, as were recommendations warranted by these results. These results and recommendations are discussed in more detail below.  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  # hx of breast cancer s/p mastectomy [[facility]]  # THROMBOCYTOSIS [NOv 2021 605 -platelets- ; Hb;10.5 white count-WNL]; MARCH 2022-JAK-2NEG; CALR-NEG.BCR-ABL-NEGATIVE.     Ductal carcinoma in situ (DCIS) of left breast  04/15/2015 Initial Diagnosis   Ductal carcinoma in situ (DCIS) of left breast   05/08/2021 Cancer Staging   Staging form: Breast, AJCC 7th Edition - Clinical stage from 05/08/2021: Stage 0 (Tis (DCIS), N0, M0) - Signed by Lloyd Huger, MD on 05/08/2021     FAMILY HISTORY:  We obtained a detailed, 4-generation family history.  Significant diagnoses are listed below: Family History  Problem Relation Age of Onset   Gastric cancer Father 41   Breast cancer Maternal Aunt    Breast cancer Daughter 32   Stomach cancer Other    Hypertension Neg Hx    Stroke Neg Hx    Ms. Soldo had 3 daughters. One passed of breast cancer at 41. She has 10 maternal half brothers, 3 maternal half sisters, and a paternal half brother and sister. None have had cancer.   Ms. Fay's mother died in her 71s. Patient's maternal great aunt (through her grandmother) had breast cancer, and some great uncles also had cancers too, unknown types. A maternal second cousin has stomach cancer.   Ms. Petros's father passed at 25 of stomach cancer.  Patient has limited information about this side of the family.   Ms. Seevers is unaware of previous family history of genetic testing for hereditary cancer risks. There is no reported Ashkenazi Jewish ancestry. There is no known consanguinity.       GENETIC TEST RESULTS: Genetic testing reported out on 09/04/2021 through the [facility] Common Hereditary Cancers+RNA cancer panel found no pathogenic mutations.   The Common Hereditary Cancers Panel + RNA offered by [facility] includes sequencing and/or deletion duplication testing of the following 47 genes: APC, ATM, AXIN2, BARD1, BMPR1A, BRCA1, BRCA2, BRIP1, CDH1, CDKN2A (p14ARF), CDKN2A (p16INK4a), CKD4, CHEK2, CTNNA1, DICER1, EPCAM (Deletion/duplication testing only), GREM1 (promoter region deletion/duplication testing only), KIT, MEN1, MLH1, MSH2, MSH3, MSH6, MUTYH, NBN, NF1, NHTL1, PALB2, PDGFRA, PMS2, POLD1, POLE, PTEN, RAD50, RAD51C, RAD51D, SDHB, SDHC, SDHD, SMAD4, SMARCA4. STK11, TP53, TSC1, TSC2, and VHL.  The following genes were evaluated for sequence changes only: SDHA and HOXB13 c.251G>A variant only.  The test report has been scanned into EPIC and is located under the Molecular Pathology section of the Results Review tab.  A portion of the result report is included below for reference.     We discussed that because current genetic testing is not perfect, it is possible there may be a gene mutation in one of these genes that current testing cannot detect, but that chance is small.  There could be another gene that has not yet been discovered, or that we have not yet tested, that is responsible for the cancer diagnoses in the family. It is also possible there  is a hereditary cause for the cancer in the family that Ms. Manzer did not inherit and therefore was not identified in her testing.  Therefore, it is important to remain in touch with cancer genetics in the future so that we can continue to offer Ms. Luckow the most up to date genetic  testing.   ADDITIONAL GENETIC TESTING: We discussed with Ms. Reine that her genetic testing was fairly extensive.  If there are genes identified to increase cancer risk that can be analyzed in the future, we would be happy to discuss and coordinate this testing at that time.    CANCER SCREENING RECOMMENDATIONS: Ms. Machi test result is considered negative (normal).  This means that we have not identified a hereditary cause for her  personal and family history of cancer at this time. Most cancers happen by chance and this negative test suggests that her cancer may fall into this category.    While reassuring, this does not definitively rule out a hereditary predisposition to cancer. It is still possible that there could be genetic mutations that are undetectable by current technology. There could be genetic mutations in genes that have not been tested or identified to increase cancer risk.  Therefore, it is recommended she continue to follow the cancer management and screening guidelines provided by her oncology and primary healthcare provider.   An individual's cancer risk and medical management are not determined by genetic test results alone. Overall cancer risk assessment incorporates additional factors, including personal medical history, family history, and any available genetic information that may result in a personalized plan for cancer prevention and surveillance.  RECOMMENDATIONS FOR FAMILY MEMBERS:  Relatives in this family might be at some increased risk of developing cancer, over the general population risk, simply due to the family history of cancer.  We recommended female relatives in this family have a yearly mammogram beginning at age 66, or 31 years younger than the earliest onset of cancer, an annual clinical breast exam, and perform monthly breast self-exams. Female relatives in this family should also have a gynecological exam as recommended by their primary provider.  All  family members should be referred for colonoscopy starting at age 68.   FOLLOW-UP: Lastly, we discussed with Ms. Silvey that cancer genetics is a rapidly advancing field and it is possible that new genetic tests will be appropriate for her and/or her family members in the future. We encouraged her to remain in contact with cancer genetics on an annual basis so we can update her personal and family histories and let her know of advances in cancer genetics that may benefit this family.   Our contact number was provided. Ms. Carano questions were answered to her satisfaction, and she knows she is welcome to call us at anytime with additional questions or concerns.   Faith Rogue, MS, [facility] Genetic Counselor [email or web address] Phone: [phone]

## 2021-09-12 ENCOUNTER — Other Ambulatory Visit: Payer: Self-pay

## 2021-09-12 DIAGNOSIS — D0512 Intraductal carcinoma in situ of left breast: Secondary | ICD-10-CM

## 2021-10-06 ENCOUNTER — Ambulatory Visit
Admission: RE | Admit: 2021-10-06 | Discharge: 2021-10-06 | Disposition: A | Discharge status: Home / Self Care | Payer: Medicare Other | Source: Ambulatory Visit | Attending: Surgery | Admitting: Surgery

## 2021-10-06 DIAGNOSIS — D0512 Intraductal carcinoma in situ of left breast: Secondary | ICD-10-CM | POA: Diagnosis present

## 2021-10-10 ENCOUNTER — Other Ambulatory Visit: Payer: Self-pay | Admitting: Surgery

## 2021-10-20 ENCOUNTER — Emergency Department
Admission: EM | Admit: 2021-10-20 | Discharge: 2021-10-20 | Disposition: A | Discharge status: Home / Self Care | Payer: Medicare Other | Attending: Emergency Medicine | Admitting: Emergency Medicine

## 2021-10-20 ENCOUNTER — Ambulatory Visit (INDEPENDENT_AMBULATORY_CARE_PROVIDER_SITE_OTHER): Payer: Medicare Other | Admitting: Surgery

## 2021-10-20 ENCOUNTER — Emergency Department: Payer: Medicare Other

## 2021-10-20 ENCOUNTER — Encounter: Payer: Self-pay | Admitting: Surgery

## 2021-10-20 ENCOUNTER — Other Ambulatory Visit: Payer: Self-pay

## 2021-10-20 VITALS — BP 175/96 | HR 71 | Temp 97.9°F | Ht 62.0 in | Wt 187.2 lb

## 2021-10-20 DIAGNOSIS — R112 Nausea with vomiting, unspecified: Secondary | ICD-10-CM | POA: Diagnosis not present

## 2021-10-20 DIAGNOSIS — R0689 Other abnormalities of breathing: Secondary | ICD-10-CM | POA: Diagnosis not present

## 2021-10-20 DIAGNOSIS — Z853 Personal history of malignant neoplasm of breast: Secondary | ICD-10-CM | POA: Insufficient documentation

## 2021-10-20 DIAGNOSIS — I10 Essential (primary) hypertension: Secondary | ICD-10-CM | POA: Insufficient documentation

## 2021-10-20 DIAGNOSIS — Z79899 Other long term (current) drug therapy: Secondary | ICD-10-CM | POA: Insufficient documentation

## 2021-10-20 DIAGNOSIS — E119 Type 2 diabetes mellitus without complications: Secondary | ICD-10-CM | POA: Insufficient documentation

## 2021-10-20 DIAGNOSIS — R55 Syncope and collapse: Secondary | ICD-10-CM | POA: Insufficient documentation

## 2021-10-20 DIAGNOSIS — Z20822 Contact with and (suspected) exposure to covid-19: Secondary | ICD-10-CM | POA: Insufficient documentation

## 2021-10-20 DIAGNOSIS — R079 Chest pain, unspecified: Secondary | ICD-10-CM

## 2021-10-20 DIAGNOSIS — R0789 Other chest pain: Secondary | ICD-10-CM | POA: Insufficient documentation

## 2021-10-20 DIAGNOSIS — K5790 Diverticulosis of intestine, part unspecified, without perforation or abscess without bleeding: Secondary | ICD-10-CM | POA: Diagnosis not present

## 2021-10-20 DIAGNOSIS — D0512 Intraductal carcinoma in situ of left breast: Secondary | ICD-10-CM

## 2021-10-20 LAB — BASIC METABOLIC PANEL
Anion gap: 10 (ref 5–15)
BUN: 16 mg/dL (ref 8–23)
CO2: 25 mmol/L (ref 22–32)
Calcium: 8.9 mg/dL (ref 8.9–10.3)
Chloride: 106 mmol/L (ref 98–111)
Creatinine, Ser: 1.11 mg/dL — ABNORMAL HIGH (ref 0.44–1.00)
GFR, Estimated: 55 mL/min — ABNORMAL LOW (ref >60)
Glucose, Bld: 144 mg/dL — ABNORMAL HIGH (ref 70–99)
Potassium: 3.2 mmol/L — ABNORMAL LOW (ref 3.5–5.1)
Sodium: 141 mmol/L (ref 135–145)

## 2021-10-20 LAB — CBC
HCT: 42.7 % (ref 36.0–46.0)
Hemoglobin: 13.5 g/dL (ref 12.0–15.0)
MCH: 29.3 pg (ref 26.0–34.0)
MCHC: 31.6 g/dL (ref 30.0–36.0)
MCV: 92.8 fL (ref 80.0–100.0)
Platelets: 332 10*3/uL (ref 150–400)
RBC: 4.6 MIL/uL (ref 3.87–5.11)
RDW: 12.9 % (ref 11.5–15.5)
WBC: 11.2 10*3/uL — ABNORMAL HIGH (ref 4.0–10.5)
nRBC: 0 % (ref 0.0–0.2)

## 2021-10-20 LAB — RESP PANEL BY RT-PCR (FLU A&B, COVID) ARPGX2
Influenza A by PCR: NEGATIVE
Influenza B by PCR: NEGATIVE
SARS Coronavirus 2 by RT PCR: NEGATIVE

## 2021-10-20 LAB — TROPONIN I (HIGH SENSITIVITY)
Troponin I (High Sensitivity): 9 ng/L (ref <18)
Troponin I (High Sensitivity): 11 ng/L (ref <18)

## 2021-10-20 LAB — HEPATIC FUNCTION PANEL
ALT: 19 U/L (ref 0–44)
AST: 22 U/L (ref 15–41)
Albumin: 4 g/dL (ref 3.5–5.0)
Alkaline Phosphatase: 84 U/L (ref 38–126)
Bilirubin, Direct: 0.1 mg/dL (ref 0.0–0.2)
Indirect Bilirubin: 0.3 mg/dL (ref 0.3–0.9)
Total Bilirubin: 0.4 mg/dL (ref 0.3–1.2)
Total Protein: 7.8 g/dL (ref 6.5–8.1)

## 2021-10-20 LAB — BRAIN NATRIURETIC PEPTIDE: B Natriuretic Peptide: 38.7 pg/mL (ref 0.0–100.0)

## 2021-10-20 LAB — LIPASE, BLOOD: Lipase: 25 U/L (ref 11–51)

## 2021-10-20 LAB — ETHANOL: Alcohol, Ethyl (B): <10 mg/dL (ref <10)

## 2021-10-20 MED ORDER — IOHEXOL 350 MG/ML SOLN
100.0000 mL | Freq: Once | INTRAVENOUS | Status: AC | PRN
  Administered 2021-10-20: 100 mL via INTRAVENOUS

## 2021-10-20 NOTE — ED Notes (Signed)
Unable to get IV on pt. Informed primary nurse

## 2021-10-20 NOTE — ED Provider Notes (Addendum)
[facility] Provider Note    Event Date/Time   First MD Initiated Contact with Patient 10/20/21 1320     (approximate)   History   Chest Pain   HPI  Gail Stevenson is a 66 y.o. female   Past medical history of pretension, breast cancer, diabetes, esophageal varices, who presents today with nausea, vomiting without blood, and intermittent sharp chest pain without radiation.  This all occurred while at rest.  Otherwise has been in her regular state of health without respiratory infectious symptoms, no urinary symptoms, bowel movements have been normal without blood, no trauma.    She is hypertensive over 616 systolic but she did not take her antihypertensives this morning.  History was obtained via the patient and daughter at bedside      Physical Exam   Triage Vital Signs: ED Triage Vitals [10/20/21 1301]  Enc Vitals Group     BP      Pulse      Resp      Temp      Temp src      SpO2      Weight      Height      Head Circumference      Peak Flow      Pain Score 8     Pain Loc      Pain Edu?      Excl. in [place]?     Most recent vital signs: Vitals:   10/20/21 1355 10/20/21 1600  BP: (!) 196/80 (!) 189/87  Pulse: 83 90  Resp: 16 16  Temp: 98.2 F (36.8 C) 98 F (36.7 C)  SpO2: 99% 97%    General: Awake, no distress.  CV:  Good peripheral perfusion.  Radial pulses present and equal bilaterally Resp:  Normal effort.  clear to auscultation bilaterally Abd:  No distention.  Mildly tender to palpation in all quadrants without rigidity or guarding.   ED Results / Procedures / Treatments   Labs (all labs ordered are listed, but only abnormal results are displayed) Labs Reviewed  BASIC METABOLIC PANEL - Abnormal; Notable for the following components:      Result Value   Potassium 3.2 (*)    Glucose, Bld 144 (*)    Creatinine, Ser 1.11 (*)    GFR, Estimated 55 (*)    All other components within normal limits  CBC - Abnormal;  Notable for the following components:   WBC 11.2 (*)    All other components within normal limits  RESP PANEL BY RT-PCR (FLU A&B, COVID) ARPGX2  ETHANOL  HEPATIC FUNCTION PANEL  LIPASE, BLOOD  BRAIN NATRIURETIC PEPTIDE  CBG MONITORING, ED  TROPONIN I (HIGH SENSITIVITY)  TROPONIN I (HIGH SENSITIVITY)     I reviewed labs and they are notable for 9, 11, and hypokalemia 3.2, chronic.  EKG  ED ECG REPORT I, Lucillie Garfinkel, the attending physician, personally viewed and interpreted this ECG.   Date: 10/20/2021  EKG Time: 1305  Rate: 78  Rhythm: normal EKG, normal sinus rhythm  Axis: nl  Intervals:LVH, no ischemic changes    RADIOLOGY I independently reviewed and interpreted chest x-ray and see no focal opacities or pneumothorax   PROCEDURES:  Critical Care performed: No  Procedures   MEDICATIONS ORDERED IN ED: Medications  iohexol (OMNIPAQUE) 350 MG/ML injection 100 mL (100 mLs Intravenous Contrast Given 10/20/21 1504)     IMPRESSION / MDM / ASSESSMENT AND PLAN / ED COURSE  I reviewed  the triage vital signs and the nursing notes.                              Differential diagnosis includes, but is not limited to, dissection, ACS, intra-abdominal infection, considered but less likely PE or respiratory infection.   The patient is on the cardiac monitor to evaluate for evidence of arrhythmia and/or significant heart rate changes.  MDM: Patient with concern for aortic dissection given hypertension and severe central chest pain.  Also consider intra-abdominal infection given nausea and mild tenderness to palpation of the abdomen.  Control hypertension with home meds, responded now in the 841Y systolic.  Work-up as above negative given to negative troponins, a CT angio chest abdomen pelvis that is negative for dissection, showed no signs of pulmonary embolism, no signs of emergent surgical abdominal pathologies (?colitis) but she now is asymptomatic in the emergency department,  walking around the department asking for food.  I think given her resolution of symptoms and negative work-up as above, safe for discharge home with PMD follow-up and strict return precautions.   Patient's presentation is most consistent with acute presentation with potential threat to life or bodily function.       FINAL CLINICAL IMPRESSION(S) / ED DIAGNOSES   Final diagnoses:  Chest pain, unspecified type     Rx / DC Orders   ED Discharge Orders     None        Note:  This document was prepared using Dragon voice recognition software and may include unintentional dictation errors.    Lucillie Garfinkel, MD 10/20/21 6063    Lucillie Garfinkel, MD 10/20/21 [id number]

## 2021-10-20 NOTE — ED Notes (Signed)
RN Sheran Lawless called and informed of pt coming to room at this time.

## 2021-10-20 NOTE — Progress Notes (Signed)
10/20/2021  History of Present Illness: Gail Stevenson is a 66 y.o. 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  She presents today for follow-up.  Patient last saw Dr. Rogue Bussing about 3 months ago.  Patient reports today that she has been having left breast discomfort over the last 2 months.  She reports that she has been very active recently.  The pain in the left breast seems to be constant in nature although improves when she is lying down.  Denies any palpable masses, nipple changes, or other skin changes.  She did have radiation after surgery and is currently taking Arimidex.  Of note, when the patient first arrived, her blood pressure was elevated to 175/96.  After our examination, the patient started reporting feeling dizzy and complaining of chest pain and shortness of breath.  She took 2 of her inhalers with no improvement.  She has a history of diabetes and we gave her Sprite and candy and then started having mild choking episode and had an episode of nausea with some spitting and dry heaving.  EMS was called as there was no improvement and the patient also was starting to appear syncopal.  Past Medical History: Past Medical History:  Diagnosis Date   Asthma    Breast cancer ([place])    Diabetes mellitus without complication ([place])    Type 2   Diastolic dysfunction    Diverticulitis    Esophageal varices without bleeding ([place])    Fothergill's neuralgia    Hemorrhoids    Hepatitis C    History of breast cancer 2003   right side total mastectomy- DCIS   Hypertension    Leg swelling    Leukocytosis    LVH (left ventricular hypertrophy)    Morbid obesity ([place])    Osteopenia    Personal history of chemotherapy    Sepsis ([place])    SIRS (systemic inflammatory response syndrome) ([place])    Thrombocytosis    Trigeminal neuralgia      Past Surgical History: Past Surgical History:  Procedure Laterality Date   BREAST BIOPSY Left 03/14/2021   [place] bx ribbon  marker,differential diagnosisi of intraductal papilloma with ADH or DCIS   BREAST LUMPECTOMY WITH RADIOFREQUENCY TAG IDENTIFICATION Left 4/94/4967   Procedure: BREAST LUMPECTOMY WITH RADIOFREQUENCY TAG IDENTIFICATION;  Surgeon: Olean Ree, MD;  Location: [facility] ORS;  Service: General;  Laterality: Left;   BREAST SURGERY Right 2003   total mastectomy   COLONOSCOPY  2019   LAPAROTOMY N/A 01/25/2019   Procedure: EXPLORATORY LAPAROTOMY, ILEOCECECTOMY;  Surgeon: Georganna Skeans, MD;  Location: [place];  Service: General;  Laterality: N/A;   MASTECTOMY Right 2003   total   UPPER GI ENDOSCOPY      Home Medications: Prior to Admission medications   Medication Sig Start Date End Date Taking? Authorizing Provider  acetaminophen (TYLENOL) 160 MG/5ML liquid Take 500 mg by mouth every 4 (four) hours as needed for fever.   Yes [provider]  acetaminophen (TYLENOL) 500 MG tablet Take 2 tablets (1,000 mg total) by mouth every 6 (six) hours as needed for mild pain. 04/27/21  Yes Ruben Mahler, Jacqulyn Bath, MD  anastrozole (ARIMIDEX) 1 MG tablet Take 1 tablet (1 mg total) by mouth daily. 07/31/21  Yes Cammie Sickle, MD  aspirin EC 81 MG tablet Take 81 mg by mouth daily.   Yes [provider]  atorvastatin (LIPITOR) 40 MG tablet Take 40 mg by mouth at bedtime.    Yes [provider]  carvedilol (COREG) 12.5 MG tablet Take 12.5 mg by mouth 2 (two) times daily. 03/20/21  Yes [provider]  ENTRESTO 24-26 MG Take 1 tablet by mouth 2 (two) times daily. 03/09/21  Yes [provider]  fluticasone-salmeterol (ADVAIR) 250-50 MCG/ACT AEPB Inhale 1 puff into the lungs in the morning and at bedtime.   Yes [provider]  gabapentin (NEURONTIN) 100 MG capsule Take 100 mg by mouth 2 (two) times daily. 03/19/21  Yes [provider]  ibuprofen (ADVIL) 600 MG tablet Take 1 tablet (600 mg total) by mouth every 8 (eight) hours as needed for moderate pain. 04/27/21  Yes  Choya Tornow, MD  LANTUS SOLOSTAR 100 UNIT/ML Solostar Pen Inject 56 Units into the skin at bedtime.   Yes [provider]  levalbuterol (XOPENEX HFA) 45 MCG/ACT inhaler Inhale 2 puffs into the lungs every 4 (four) hours as needed for wheezing.   Yes [provider]  loperamide (IMODIUM) 2 MG capsule Take 2 mg by mouth 4 (four) times daily as needed for diarrhea or loose stools. 03/27/21  Yes [provider]  montelukast (SINGULAIR) 10 MG tablet Take 10 mg by mouth at bedtime. 12/26/18  Yes [provider]  Multiple Vitamin (MULTIVITAMIN WITH MINERALS) TABS tablet Take 4 tablets by mouth daily.   Yes [provider]  omeprazole (PRILOSEC) 20 MG capsule Take 20 mg by mouth daily. 01/16/21  Yes [provider]  PROAIR HFA 108 (90 Base) MCG/ACT inhaler Inhale 2 puffs into the lungs every 6 (six) hours as needed for wheezing or shortness of breath.    Yes [provider]  silver sulfADIAZINE (SILVADENE) 1 % cream Apply 1 Application topically 2 (two) times daily. 07/31/21  Yes Cammie Sickle, MD  potassium chloride (KLOR-CON) 20 MEQ packet Take 20 mEq by mouth 2 (two) times daily for 5 days. Patient not taking: Reported on 07/27/2021 06/26/21 07/01/21  Rada Hay, MD    Allergies: Allergies  Allergen Reactions   Metformin Other (See Comments) and Swelling    Unable to walk   Patanol [Olopatadine]     Unknown reaction    Paroxetine Hcl Rash    Review of Systems: Review of Systems  Constitutional:  Negative for chills and fever.  Respiratory:  Positive for shortness of breath.   Cardiovascular:  Positive for chest pain.  Gastrointestinal:  Positive for nausea and vomiting.  Neurological:  Positive for dizziness.    Physical Exam BP (!) 175/96   Pulse 71   Temp 97.9 F (36.6 C) (Oral)   Ht '5\' 2"'$  (1.575 m)   Wt 187 lb 3.2 oz (84.9 kg)   SpO2 98%   BMI 34.24 kg/m  CONSTITUTIONAL: No acute distress on initial  examination.   HEENT:  Normocephalic, atraumatic, extraocular motion intact. RESPIRATORY:  Normal respiratory effort without pathologic use of accessory muscles. CARDIOVASCULAR: Regular rhythm and rate. BREAST: Right breast status postmastectomy with well-healed scar.  No palpable masses or nodules on the right chest or any skin changes.  No right axillary lymphadenopathy.  On the left, patient is status post lumpectomy with scar in the superior periareolar area which is well-healed.  Skin changes from radiation are much improved and now the skin is softer although still with some discoloration inferiorly.  No palpable masses, no nipple changes.  No left axillary lymphadenopathy. NEUROLOGIC:  Motor and sensation is grossly normal.  Cranial nerves are grossly intact. PSYCH:  Alert and oriented to person, place and time.  Affect is normal.  Labs/Imaging: Mammogram on 10/06/2021: FINDINGS: There is density and architectural distortion within the LEFT breast, consistent with postsurgical changes. These are new in comparison to prior, consistent with interval lumpectomy. Vascular calcifications are noted. No suspicious mass, distortion, or microcalcifications are identified to suggest presence of malignancy.   IMPRESSION: No mammographic evidence of malignancy.   RECOMMENDATION: Recommend LEFT diagnostic mammogram (with ultrasound as deemed necessary) in 1 year.   I have discussed the findings and recommendations with the patient. If applicable, a reminder letter will be sent to the patient regarding the next appointment.   BI-RADS CATEGORY  2: Benign.  Assessment and Plan: This is a 66 y.o. female status post left lumpectomy for DCIS  - Patient's skin has improved since I saw her last and is now softer with fewer radiation changes.  I discussed with her that the discomfort that she may be having could be related to increased activity level and perhaps a less supportive bra allowing for the  weight of the breast to pull more on the scar tissue.  Recommended that she try a more supportive bra to see if this can help.  Now her increased activity is over this may be able to improve on its own. - As mentioned, after examination the patient started complaining of feeling dizzy with chest pain and shortness of breath.  She did not improve after taking 2 of her inhalers.  Per her daughter, she had not taken any of her medications today.  She does have a history of diabetes and she was given a Sprite and hard candy with some mild initial improvement.  However she then started having nausea with episodes of spitting and also started to have what look like syncopal events.  Blood pressure was rechecked and it appeared to be 200/110.  EMS was called and on their arrival, blood glucose was checked which was 100.  EKG was done which did not show any ST elevation.  However the patient continued having some presyncopal or syncopal events and thus she was taken to the ER for further evaluation. - From the breast standpoint, she may follow-up with [place] in 6 months for another breast exam.  I spent 35 minutes dedicated to the care of this patient on the date of this encounter to include pre-visit review of records, face-to-face time with the patient discussing diagnosis and management, and any post-visit coordination of care.   Melvyn Neth, [facility]

## 2021-10-20 NOTE — Discharge Instructions (Signed)
   Thank you for choosing us for your health care today!  Please see your primary doctor this week for a follow up appointment.   If you do not have a primary doctor call the following clinics to establish care:  If you have insurance:  [facility] [phone] [address] [place] [address]   [facility]  [phone] [address] [place] [address]   If you do not have insurance:  [facility]  [phone] [address] [place] [address]  Sometimes, in the early stages of certain disease courses it is difficult to detect in the emergency department evaluation -- so, it is important that you continue to monitor your symptoms and call your doctor right away or return to the emergency department if you develop any new or worsening symptoms.  It was my pleasure to care for you today.   Imogen Maddalena S. Tyrome Donatelli, MD  

## 2021-10-20 NOTE — Patient Instructions (Addendum)
You may try wearing a sports bra for more compression until your new bra is ordered.    Breast Self-Awareness Breast self-awareness means being familiar with how your breasts look and feel. It involves checking your breasts regularly and telling your health care provider about any changes. Practicing breast self-awareness helps to maintain breast health. Sometimes, changes are not harmful (are benign). Other times, a change in your breasts can be a sign of a serious medical problem. Being familiar with the look and feel of your breasts can help you catch a breast problem while it is still small and can be treated. You should do breast self-exams even if you have breast implants. What you need: A mirror. A well-lit room. A pillow or other soft object. How to do a breast self-exam A breast self-exam is one way to learn what is normal for your breasts and whether your breasts are changing. To do a breast self-exam: Look for changes  Remove all the clothing above your waist. Stand in front of a mirror in a room with good lighting. Put your hands down at your sides. Compare your breasts in the mirror. Look for differences between them (asymmetry), such as: Differences in shape. Differences in size. Puckers, dips, and bumps in one breast and not the other. Look at each breast for changes in the skin, such as: Redness. Scaly areas. Skin thickening. Dimpling. Open sores (ulcers). Look for changes in your nipples, such as: Discharge. Bleeding. Dimpling. Redness. A nipple that looks pushed in (retracted), or that has changed position. Feel for changes Carefully feel your breasts for lumps and changes. It is best to do this self-exam while lying down. Follow these steps to feel each breast: Place a pillow under the shoulder of one side of your body. Place the arm of that side of your body behind your head. Feel the breast of that side of your body using the hand of the opposite  arm. To do this: Start in the nipple area and use the pads of your three middle fingers to make -inch (2 cm) overlapping circles. Use light, medium, and then firm pressure as you feel your breast, gently covering the entire breast area and armpit. Continue the overlapping circles, moving downward over the breast until you feel your ribs below your breast. Then, make circles with your fingers going upward until you reach your collarbone. Next, make circles by moving outward across your breast and into your armpit area. Squeeze the nipple. Check for discharge and lumps. Repeat steps 1-7 to check your other breast. Sit or stand in the tub or shower. With soapy water on your skin, feel each breast the same way you did when you were lying down. Write down what you find Writing down what you find can help you remember what to discuss with your health care provider. Write down: What is normal for each breast. Any changes that you find in each breast. These include: The kind of changes you find. Any pain or tenderness. Size and location of any lumps. Where you are in your menstrual cycle, if you are still getting your menstrual period (menstruating). General tips If you are breastfeeding, the best time to examine your breasts is after a feeding or after using a breast pump. If you menstruate, the best time to examine your breasts is 5-7 days after your menstrual period. Breasts are generally lumpier during menstrual periods, and it may be more difficult to notice changes. With time  and practice, you will become more familiar with the differences in your breasts and more comfortable with the exam. Contact a health care provider if: You see a change in the shape or size of your breasts or nipples. You see a change in the skin of your breast or nipples, such as a reddened or scaly area. You have unusual discharge from your nipples. You find a new lump or thick area. You have breast pain. You have  any concerns about your breast health. Summary Breast self-awareness includes looking for physical changes in your breasts and feeling for any changes within your breasts. Breast self-awareness should be done in front of a mirror in a well-lit room. If you menstruate, the best time to examine your breasts is 5-7 days after your menstrual period. Tell your health care provider about any changes you notice in your breasts. Changes include changes in size, changes on the skin, pain or tenderness, or unusual fluid from your nipples. This information is not intended to replace advice given to you by your health care provider. Make sure you discuss any questions you have with your health care provider. Document Revised: 12/20/2020 Document Reviewed: 12/01/2020 [employer] Patient Education  [place].

## 2021-10-20 NOTE — ED Notes (Signed)
Pt confused and passing out on EDT Faith. Pt taken to ED 7 at this time. EKG performed in triage.

## 2021-10-20 NOTE — ED Triage Notes (Signed)
Pt comes via EMs from doc office. Office thought that pt sugar may be dropping so they gave candy and juice. Office unable to check sugar due to not having proper equipment. EMS got to office and pt states sharp CP to EMs. CBG was 100 and then checked again and was 129. EMS reports pt was appearing a little confused.   BP-230/108 HR-86 O2-100% RA  Daughter also reports sometimes when pt talks weird it is usually due to pt drinking.

## 2021-10-20 NOTE — ED Notes (Signed)
Pt walked over to this RN asking to be discharged or something to eat or drink. Pt is A&O x4.

## 2021-10-23 ENCOUNTER — Other Ambulatory Visit: Payer: Self-pay | Admitting: Surgery

## 2021-10-23 ENCOUNTER — Ambulatory Visit
Admission: RE | Admit: 2021-10-23 | Discharge: 2021-10-23 | Disposition: A | Discharge status: Home / Self Care | Payer: Medicare Other | Source: Ambulatory Visit | Attending: Internal Medicine | Admitting: Internal Medicine

## 2021-10-23 ENCOUNTER — Encounter: Payer: Self-pay | Admitting: Surgery

## 2021-10-23 DIAGNOSIS — Z13820 Encounter for screening for osteoporosis: Secondary | ICD-10-CM | POA: Diagnosis not present

## 2021-10-23 DIAGNOSIS — M8589 Other specified disorders of bone density and structure, multiple sites: Secondary | ICD-10-CM | POA: Diagnosis not present

## 2021-10-23 DIAGNOSIS — D0512 Intraductal carcinoma in situ of left breast: Secondary | ICD-10-CM | POA: Diagnosis not present

## 2021-10-23 DIAGNOSIS — Z780 Asymptomatic menopausal state: Secondary | ICD-10-CM | POA: Diagnosis not present

## 2021-10-23 MED ORDER — IBUPROFEN 600 MG PO TABS: 600.0000 mg | ORAL_TABLET | Freq: Three times a day (TID) | ORAL | 1 refills | Status: DC | PRN

## 2021-10-30 ENCOUNTER — Ambulatory Visit: Payer: Medicare Other | Admitting: Internal Medicine

## 2021-10-30 ENCOUNTER — Other Ambulatory Visit: Payer: Medicare Other

## 2021-11-01 ENCOUNTER — Encounter: Payer: Self-pay | Admitting: Nurse Practitioner

## 2021-11-01 ENCOUNTER — Inpatient Hospital Stay: Payer: Medicare Other | Attending: Oncology

## 2021-11-01 ENCOUNTER — Inpatient Hospital Stay: Payer: Medicare Other

## 2021-11-01 ENCOUNTER — Inpatient Hospital Stay (HOSPITAL_BASED_OUTPATIENT_CLINIC_OR_DEPARTMENT_OTHER): Payer: Medicare Other | Admitting: Nurse Practitioner

## 2021-11-01 VITALS — BP 132/95 | HR 87 | Temp 98.6°F | Resp 16 | Ht 62.0 in | Wt 188.4 lb

## 2021-11-01 DIAGNOSIS — Z08 Encounter for follow-up examination after completed treatment for malignant neoplasm: Secondary | ICD-10-CM

## 2021-11-01 DIAGNOSIS — Z9011 Acquired absence of right breast and nipple: Secondary | ICD-10-CM | POA: Insufficient documentation

## 2021-11-01 DIAGNOSIS — Z853 Personal history of malignant neoplasm of breast: Secondary | ICD-10-CM

## 2021-11-01 DIAGNOSIS — D0512 Intraductal carcinoma in situ of left breast: Secondary | ICD-10-CM | POA: Diagnosis present

## 2021-11-01 DIAGNOSIS — Z79811 Long term (current) use of aromatase inhibitors: Secondary | ICD-10-CM | POA: Insufficient documentation

## 2021-11-01 DIAGNOSIS — C801 Malignant (primary) neoplasm, unspecified: Secondary | ICD-10-CM

## 2021-11-01 DIAGNOSIS — Z803 Family history of malignant neoplasm of breast: Secondary | ICD-10-CM | POA: Diagnosis not present

## 2021-11-01 DIAGNOSIS — D75839 Thrombocytosis, unspecified: Secondary | ICD-10-CM | POA: Insufficient documentation

## 2021-11-01 DIAGNOSIS — Z800 Family history of malignant neoplasm of digestive organs: Secondary | ICD-10-CM | POA: Diagnosis not present

## 2021-11-01 DIAGNOSIS — F411 Generalized anxiety disorder: Secondary | ICD-10-CM

## 2021-11-01 DIAGNOSIS — E119 Type 2 diabetes mellitus without complications: Secondary | ICD-10-CM | POA: Insufficient documentation

## 2021-11-01 DIAGNOSIS — Z5181 Encounter for therapeutic drug level monitoring: Secondary | ICD-10-CM | POA: Diagnosis not present

## 2021-11-01 DIAGNOSIS — Z923 Personal history of irradiation: Secondary | ICD-10-CM | POA: Diagnosis not present

## 2021-11-01 DIAGNOSIS — I10 Essential (primary) hypertension: Secondary | ICD-10-CM | POA: Insufficient documentation

## 2021-11-01 DIAGNOSIS — Z87891 Personal history of nicotine dependence: Secondary | ICD-10-CM | POA: Diagnosis not present

## 2021-11-01 LAB — CBC WITH DIFFERENTIAL/PLATELET
Abs Immature Granulocytes: 0.06 10*3/uL (ref 0.00–0.07)
Basophils Absolute: 0 10*3/uL (ref 0.0–0.1)
Basophils Relative: 0 %
Eosinophils Absolute: 0.1 10*3/uL (ref 0.0–0.5)
Eosinophils Relative: 1 %
HCT: 40.2 % (ref 36.0–46.0)
Hemoglobin: 13.6 g/dL (ref 12.0–15.0)
Immature Granulocytes: 1 %
Lymphocytes Relative: 21 %
Lymphs Abs: 2.3 10*3/uL (ref 0.7–4.0)
MCH: 30.2 pg (ref 26.0–34.0)
MCHC: 33.8 g/dL (ref 30.0–36.0)
MCV: 89.3 fL (ref 80.0–100.0)
Monocytes Absolute: 1 10*3/uL (ref 0.1–1.0)
Monocytes Relative: 9 %
Neutro Abs: 7.6 10*3/uL (ref 1.7–7.7)
Neutrophils Relative %: 68 %
Platelets: 349 10*3/uL (ref 150–400)
RBC: 4.5 MIL/uL (ref 3.87–5.11)
RDW: 12.5 % (ref 11.5–15.5)
WBC: 11.1 10*3/uL — ABNORMAL HIGH (ref 4.0–10.5)
nRBC: 0 % (ref 0.0–0.2)

## 2021-11-01 LAB — COMPREHENSIVE METABOLIC PANEL
ALT: 26 U/L (ref 0–44)
AST: 30 U/L (ref 15–41)
Albumin: 4 g/dL (ref 3.5–5.0)
Alkaline Phosphatase: 92 U/L (ref 38–126)
Anion gap: 7 (ref 5–15)
BUN: 24 mg/dL — ABNORMAL HIGH (ref 8–23)
CO2: 28 mmol/L (ref 22–32)
Calcium: 9 mg/dL (ref 8.9–10.3)
Chloride: 103 mmol/L (ref 98–111)
Creatinine, Ser: 1.15 mg/dL — ABNORMAL HIGH (ref 0.44–1.00)
GFR, Estimated: 53 mL/min — ABNORMAL LOW (ref >60)
Glucose, Bld: 120 mg/dL — ABNORMAL HIGH (ref 70–99)
Potassium: 3.9 mmol/L (ref 3.5–5.1)
Sodium: 138 mmol/L (ref 135–145)
Total Bilirubin: 0.4 mg/dL (ref 0.3–1.2)
Total Protein: 7.8 g/dL (ref 6.5–8.1)

## 2021-11-01 NOTE — Progress Notes (Unsigned)
[facility] NOTE  Patient Care Team: Jonelle Sports, MD as PCP - General (Obstetrics and Gynecology) Theodore Demark, RN (Inactive) as Oncology Nurse Navigator Cammie Sickle, MD as Consulting Physician (Oncology)  CHIEF COMPLAINTS/PURPOSE OF CONSULTATION: DCIS/ Thrombocytosis.  Oncology History Overview Note   # DCIS LEFT [MAY 2023- s/p WBRT [Dr.Crystal.]; ER POSITIVE; JUNE 2023-start anastrozole.  # hx of breast cancer s/p mastectomy [[facility]]  # THROMBOCYTOSIS [NOv 2021 605 -platelets- ; Hb;10.5 white count-WNL]; MARCH 2022-JAK-2NEG; CALR-NEG.BCR-ABL-NEGATIVE.     Ductal carcinoma in situ (DCIS) of left breast  04/15/2015 Initial Diagnosis   Ductal carcinoma in situ (DCIS) of left breast   05/08/2021 Cancer Staging   Staging form: Breast, AJCC 7th Edition - Clinical stage from 05/08/2021: Stage 0 (Tis (DCIS), N0, M0) - Signed by Lloyd Huger, MD on 05/08/2021    HISTORY OF PRESENTING ILLNESS:  Gail Stevenson 66 y.o.  female pleasant prior history of breast cancer on the right side-s/p mastectomy; and currently with a new diagnosis of left DCIS is here for follow-up.  Patient finished radiation a month ago.  Complains of mild breast discomfort post radiation.  Denies any nausea vomiting.  Denies any fevers or chills. C/o hand and hip pain, feeling like needles in her feet. Denies any joint pains back pain.    Review of Systems  Constitutional:  Positive for malaise/fatigue. Negative for chills, diaphoresis, fever and weight loss.  HENT:  Negative for nosebleeds and sore throat.   Eyes:  Negative for double vision.  Respiratory:  Negative for cough, hemoptysis, sputum production, shortness of breath and wheezing.   Cardiovascular:  Negative for chest pain, palpitations, orthopnea and leg swelling.  Gastrointestinal:  Negative for abdominal pain, blood in stool, constipation, diarrhea, heartburn, melena, nausea and vomiting.  Genitourinary:   Negative for dysuria, frequency and urgency.  Musculoskeletal:  Positive for back pain and joint pain.  Skin: Negative.  Negative for itching and rash.  Neurological:  Negative for dizziness, tingling, focal weakness, weakness and headaches.  Endo/Heme/Allergies:  Does not bruise/bleed easily.  Psychiatric/Behavioral:  Negative for depression. The patient is not nervous/anxious and does not have insomnia.      MEDICAL HISTORY:  Past Medical History:  Diagnosis Date   Asthma    Breast cancer ([place])    Diabetes mellitus without complication ([place])    Type 2   Diastolic dysfunction    Diverticulitis    Esophageal varices without bleeding (HCC)    Fothergill's neuralgia    Hemorrhoids    Hepatitis C    History of breast cancer 2003   right side total mastectomy- DCIS   Hypertension    Leg swelling    Leukocytosis    LVH (left ventricular hypertrophy)    Morbid obesity ([place])    Osteopenia    Personal history of chemotherapy    Sepsis ([place])    SIRS (systemic inflammatory response syndrome) (HCC)    Thrombocytosis    Trigeminal neuralgia     SURGICAL HISTORY: Past Surgical History:  Procedure Laterality Date   BREAST BIOPSY Left 03/14/2021   [place] bx ribbon marker,differential diagnosisi of intraductal papilloma with ADH or DCIS   BREAST LUMPECTOMY WITH RADIOFREQUENCY TAG IDENTIFICATION Left 6/94/8546   Procedure: BREAST LUMPECTOMY WITH RADIOFREQUENCY TAG IDENTIFICATION;  Surgeon: Olean Ree, MD;  Location: [facility];  Service: General;  Laterality: Left;   BREAST SURGERY Right 2003   total mastectomy   COLONOSCOPY  2019   LAPAROTOMY N/A 01/25/2019  Procedure: EXPLORATORY LAPAROTOMY, ILEOCECECTOMY;  Surgeon: Georganna Skeans, MD;  Location: [place] [place];  Service: General;  Laterality: N/A;   MASTECTOMY Right 2003   total   UPPER GI ENDOSCOPY      SOCIAL HISTORY: Social History   Socioeconomic History   Marital status: Divorced    Spouse name: Not on file   Number of  children: 1   Years of education: Not on file   Highest education level: Not on file  Occupational History   Not on file  Tobacco Use   Smoking status: Former    Packs/day: 0.50    Years: 30.00    Total pack years: 15.00    Types: Cigarettes    Quit date: 05/17/1992    Years since quitting: 29.4   Smokeless tobacco: Never  Vaping Use   Vaping Use: Never used  Substance and Sexual Activity   Alcohol use: Not Currently    Comment: Occasional beer   Drug use: No   Sexual activity: Yes  Other Topics Concern   Not on file  Social History Narrative   Lives with deceased daughter's boyfriend and ex-husband in [place]; Daughter assists in medical care. Hx of smoking; recovering alcohol. Used to work-house keeping/construction.    Social Determinants of Health   Financial Resource Strain: Not on file  Food Insecurity: Not on file  Transportation Needs: Not on file  Physical Activity: Not on file  Stress: Not on file  Social Connections: Not on file  Intimate Partner Violence: Not on file    FAMILY HISTORY: Family History  Problem Relation Age of Onset   Gastric cancer Father 36   Breast cancer Maternal Aunt    Breast cancer Daughter 64   Stomach cancer Other    Hypertension Neg Hx    Stroke Neg Hx     ALLERGIES:  is allergic to metformin, patanol [olopatadine], and paroxetine hcl.  MEDICATIONS:  Current Outpatient Medications  Medication Sig Dispense Refill   acetaminophen (TYLENOL) 160 MG/5ML liquid Take 500 mg by mouth every 4 (four) hours as needed for fever.     acetaminophen (TYLENOL) 500 MG tablet Take 2 tablets (1,000 mg total) by mouth every 6 (six) hours as needed for mild pain.     anastrozole (ARIMIDEX) 1 MG tablet Take 1 tablet (1 mg total) by mouth daily. 90 tablet 2   aspirin EC 81 MG tablet Take 81 mg by mouth daily.     atorvastatin (LIPITOR) 40 MG tablet Take 40 mg by mouth at bedtime.      carvedilol (COREG) 12.5 MG tablet Take 12.5 mg by mouth 2  (two) times daily.     ENTRESTO 24-26 MG Take 1 tablet by mouth 2 (two) times daily.     fluticasone-salmeterol (ADVAIR) 250-50 MCG/ACT AEPB Inhale 1 puff into the lungs in the morning and at bedtime.     gabapentin (NEURONTIN) 100 MG capsule Take 100 mg by mouth 2 (two) times daily.     ibuprofen (ADVIL) 600 MG tablet Take 1 tablet (600 mg total) by mouth every 8 (eight) hours as needed for moderate pain. 60 tablet 1   LANTUS SOLOSTAR 100 UNIT/ML Solostar Pen Inject 56 Units into the skin at bedtime.     levalbuterol (XOPENEX HFA) 45 MCG/ACT inhaler Inhale 2 puffs into the lungs every 4 (four) hours as needed for wheezing.     loperamide (IMODIUM) 2 MG capsule Take 2 mg by mouth 4 (four) times daily as needed for diarrhea or  loose stools.     montelukast (SINGULAIR) 10 MG tablet Take 10 mg by mouth at bedtime.     Multiple Vitamin (MULTIVITAMIN WITH MINERALS) TABS tablet Take 4 tablets by mouth daily.     omeprazole (PRILOSEC) 20 MG capsule Take 20 mg by mouth daily.     PROAIR HFA 108 (90 Base) MCG/ACT inhaler Inhale 2 puffs into the lungs every 6 (six) hours as needed for wheezing or shortness of breath.   5   silver sulfADIAZINE (SILVADENE) 1 % cream Apply 1 Application topically 2 (two) times daily. 50 g 1   potassium chloride (KLOR-CON) 20 MEQ packet Take 20 mEq by mouth 2 (two) times daily for 5 days. (Patient not taking: Reported on 11/01/2021) 10 packet 0   No current facility-administered medications for this visit.     PHYSICAL EXAMINATION:   Vitals:   11/01/21 1427  BP: (!) 132/95  Pulse: 87  Resp: 16  Temp: 98.6 F (37 C)  SpO2: 100%   Filed Weights   11/01/21 1427  Weight: 188 lb 6.4 oz (85.5 kg)    Physical Exam Constitutional:      Comments: Ambulating independently.   HENT:     Head: Normocephalic and atraumatic.     Mouth/Throat:     Pharynx: No oropharyngeal exudate.  Eyes:     Pupils: Pupils are equal, round, and reactive to light.  Cardiovascular:      Rate and Rhythm: Normal rate and regular rhythm.  Pulmonary:     Effort: Pulmonary effort is normal. No respiratory distress.     Breath sounds: Normal breath sounds. No wheezing.  Abdominal:     General: Bowel sounds are normal. There is no distension.     Palpations: Abdomen is soft. There is no mass.     Tenderness: There is no abdominal tenderness. There is no guarding or rebound.  Musculoskeletal:        General: No tenderness. Normal range of motion.     Cervical back: Normal range of motion and neck supple.  Skin:    General: Skin is warm.  Neurological:     Mental Status: She is alert and oriented to person, place, and time.  Psychiatric:        Mood and Affect: Affect normal.      LABORATORY DATA:  I have reviewed the data as listed Lab Results  Component Value Date   WBC 11.1 (H) 11/01/2021   HGB 13.6 11/01/2021   HCT 40.2 11/01/2021   MCV 89.3 11/01/2021   PLT 349 11/01/2021   Recent Labs    02/01/21 1622 04/21/21 1136 06/26/21 1357 10/20/21 1324 11/01/21 1336  NA 138   < > 140 141 138  K 3.3*   < > 3.1* 3.2* 3.9  CL 105   < > 102 106 103  CO2 28   < > '30 25 28  '$ GLUCOSE 123*   < > 118* 144* 120*  BUN 14   < > 11 16 24*  CREATININE 0.85   < > 1.00 1.11* 1.15*  CALCIUM 8.7*   < > 8.5* 8.9 9.0  GFRNONAA >60   < > >60 55* 53*  PROT 7.6  --   --  7.8 7.8  ALBUMIN 3.8  --   --  4.0 4.0  AST 20  --   --  22 30  ALT 21  --   --  19 26  ALKPHOS 81  --   --  84 92  BILITOT 0.7  --   --  0.4 0.4  BILIDIR  --   --   --  0.1  --   IBILI  --   --   --  0.3  --    < > = values in this interval not displayed.      DG Bone Density  Result Date: 10/24/2021 EXAM: DUAL X-RAY ABSORPTIOMETRY (DXA) FOR BONE MINERAL DENSITY IMPRESSION: Your patient Pammie Chirino completed a BMD test on 10/23/2021 using the [place] (software version: 14.10) manufactured by [employer]. The following summarizes the results of our evaluation. Technologist:VLM  PATIENT BIOGRAPHICAL: Name: Mirabelle, Cyphers Patient ID: [id number] Birth Date: 1955/07/02 Height: 61.0 in. Gender: Female Exam Date: 10/23/2021 Weight: 186.0 lbs. Indications: Breast CA, Postmenopausal Fractures: Treatments: Anastrozole, Vitamin D DENSITOMETRY RESULTS: Site         Region     Measured Date Measured Age WHO Classification Young Adult T-score BMD         %Change vs. Previous Significant Change (*) AP Spine L1-L4 10/23/2020 66.8 Normal 0.5 1.253 g/cm2 - - DualFemur Neck Right 10/23/2021 66.9 Osteopenia -1.7 0.796 g/cm2 - - Left Forearm Radius 33% 10/23/2021 66.9 Osteopenia -1.9 0.712 g/cm2 - - ASSESSMENT: The BMD measured at Forearm Radius 33% is 0.712 g/cm2 with a T-score of -1.9. This patient is considered osteopenic according to [facility]) criteria. The scan quality is good. World Pharmacologist [facility]) criteria for post-menopausal, Caucasian Women: Normal:                   T-score at or above -1 SD Osteopenia/low bone mass: T-score between -1 and -2.5 SD Osteoporosis:             T-score at or below -2.5 SD RECOMMENDATIONS: 1. All patients should optimize calcium and vitamin D intake. 2. Consider FDA-approved medical therapies in postmenopausal women and men aged 57 years and older, based on the following: a. A hip or vertebral(clinical or morphometric) fracture b. T-score < -2.5 at the femoral neck or spine after appropriate evaluation to exclude secondary causes c. Low bone mass (T-score between -1.0 and -2.5 at the femoral neck or spine) and a 10-year probability of a hip fracture > 3% or a 10-year probability of a major osteoporosis-related fracture > 20% based on the [place]-adapted WHO algorithm 3. Clinician judgment and/or patient preferences may indicate treatment for people with 10-year fracture probabilities above or below these levels FOLLOW-UP: People with diagnosed cases of osteoporosis or at high risk for fracture should have regular bone mineral density tests. For  patients eligible for Medicare, routine testing is allowed once every 2 years. The testing frequency can be increased to one year for patients who have rapidly progressing disease, those who are receiving or discontinuing medical therapy to restore bone mass, or have additional risk factors. I have reviewed this report, and agree with the above findings. Mark A. Thornton Papas, M.D. [facility] Dear Cammie Sickle, Your patient Keishla Oyer completed a FRAX assessment on 10/23/2021 using the [place] (analysis version: 14.10) manufactured by [employer]. The following summarizes the results of our evaluation. PATIENT BIOGRAPHICAL: Name: Tuleen, Mandelbaum Patient ID: [id number] Birth Date: [birth year] Height:    61.0 in. Gender:     [age]    [age]       Weight:    186.0 lbs. Ethnicity:  Black  Exam Date: 10/23/2021 FRAX* RESULTS:  (version: 3.5) 10-year Probability of Fracture1 Major Osteoporotic Fracture2 Hip Fracture 4.2% 0.6% Population: [place] (Black) Risk Factors: None Based on Femur (Right) Neck BMD 1 -The 10-year probability of fracture may be lower than reported if the patient has received treatment. 2 -Major Osteoporotic Fracture: Clinical Spine, Forearm, Hip or Shoulder *FRAX is a Materials engineer of the [employer] for Metabolic Bone Disease, a [place] (WHO) [facility]. ASSESSMENT: The probability of a major osteoporotic fracture is 4.2% within the next ten years. The probability of a hip fracture is 0.6% within the next ten years. I have reviewed this report and agree with the above findings. Mark A. Thornton Papas, M.D. [facility] Electronically Signed   By: Lavonia Dana M.D.   On: 10/24/2021 12:17   CT Head Wo Contrast  Result Date: 10/20/2021 CLINICAL DATA:  Syncope/presyncope, chest pain EXAM: CT HEAD WITHOUT CONTRAST TECHNIQUE: Contiguous axial images were obtained from the base  of the skull through the vertex without intravenous contrast. RADIATION DOSE REDUCTION: This exam was performed according to the departmental dose-optimization program which includes automated exposure control, adjustment of the mA and/or kV according to patient size and/or use of iterative reconstruction technique. COMPARISON:  06/16/2021 FINDINGS: Brain: No acute intracranial findings are seen in noncontrast CT brain. There are no signs of bleeding within the cranium. There is no focal edema or mass effect. Ventricles are not dilated. Vascular: Scattered arterial calcifications are seen. Skull: Unremarkable. Sinuses/Orbits: Unremarkable. Other: No significant interval changes are noted. IMPRESSION: No acute intracranial findings are seen in noncontrast CT brain. Electronically Signed   By: Elmer Picker M.D.   On: 10/20/2021 15:40   CT Angio Chest/Abd/Pel for Dissection W and/or W/WO  Result Date: 10/20/2021 CLINICAL DATA:  Chest pain EXAM: CT ANGIOGRAPHY CHEST, ABDOMEN AND PELVIS TECHNIQUE: Non-contrast CT of the chest was initially obtained. Multidetector CT imaging through the chest, abdomen and pelvis was performed using the standard protocol during bolus administration of intravenous contrast. Multiplanar reconstructed images and MIPs were obtained and reviewed to evaluate the vascular anatomy. RADIATION DOSE REDUCTION: This exam was performed according to the departmental dose-optimization program which includes automated exposure control, adjustment of the mA and/or kV according to patient size and/or use of iterative reconstruction technique. CONTRAST:  184m OMNIPAQUE IOHEXOL 350 MG/ML SOLN COMPARISON:  Previous studies including chest radiograph done today and CT chest done on 06/16/2021 FINDINGS: CTA CHEST FINDINGS Cardiovascular: There is no evidence of mural hematoma in the noncontrast images of thoracic aorta. There is homogeneous enhancement in thoracic aorta. There are scattered  calcifications and atherosclerotic plaques There are no filling defects in central pulmonary artery branches. Mediastinum/Nodes: No significant lymphadenopathy is seen. There is 6 mm low-density nodule in the left lobe of thyroid. No follow-up imaging is recommended. Lungs/Pleura: There is no focal pulmonary consolidation. There is no pleural effusion or pneumothorax. Musculoskeletal: No acute findings are seen bony structures in thorax. There is previous right mastectomy. Review of the MIP images confirms the above findings. CTA ABDOMEN AND PELVIS FINDINGS VASCULAR Aorta: There is no significant stenosis or dissection. There is no focal aneurysmal dilation. There are scattered atherosclerotic plaques and calcifications. Celiac: No significant stenosis. SMA: No significant stenosis. Renals: No significant stenosis. IMA: Patent. Inflow: Unremarkable. Veins: Unremarkable. Review of the MIP images confirms the above findings. NON-VASCULAR Hepatobiliary: There is fatty infiltration in liver. There is no dilation of bile ducts. Gallbladder is distended. There is no wall  thickening. There is no fluid around the gallbladder. Pancreas: No focal abnormalities are seen. Spleen: Unremarkable. Adrenals/Urinary Tract: Adrenals are unremarkable. There is no hydronephrosis. There are no renal or ureteral stones. Urinary bladder is not distended. Stomach/Bowel: Stomach is unremarkable. Small bowel loops are not dilated. Appendix is not seen. Surgical staples are seen in right colon. There is mild diffuse wall thickening in colon. There is no pericolic stranding. Multiple diverticula are seen colon without signs of focal acute diverticulitis. Lymphatic: Unremarkable. Reproductive: Uterus is not seen. Other: There is no ascites or pneumoperitoneum. Musculoskeletal: No acute findings are seen. Review of the MIP images confirms the above findings. IMPRESSION: There is no evidence of thoracic aortic dissection. There is no evidence of  central pulmonary artery embolism. There is no evidence of dissection or aneurysm in the abdominal aorta. Major branches of thoracic and abdominal aorta are patent. There is no focal pulmonary consolidation. There is no evidence of intestinal obstruction or pneumoperitoneum. There is no hydronephrosis. Diverticulosis of colon without signs of focal diverticulitis. There is mild diffuse wall thickening in colon which may be due to incomplete distention or suggest mild chronic nonspecific colitis. Fatty liver.  Other findings as described in the body of the report. Electronically Signed   By: Elmer Picker M.D.   On: 10/20/2021 15:37   DG Chest 2 View  Result Date: 10/20/2021 CLINICAL DATA:  Chest pain.  Suspected hypoglycemia. EXAM: CHEST - 2 VIEW COMPARISON:  Radiographs 06/26/2021 and 06/16/2021.  CT 06/16/2021. FINDINGS: The heart size and mediastinal contours are stable. Linear atelectasis or scarring at the left lung base appears similar to prior studies. The lungs are otherwise clear. There is no pleural effusion or pneumothorax. Postsurgical changes from right mastectomy are noted. There are diffuse degenerative changes throughout the spine. IMPRESSION: Stable postoperative chest. No evidence of acute cardiopulmonary process. Electronically Signed   By: Richardean Sale M.D.   On: 10/20/2021 14:21   MM DIAG BREAST TOMO UNI LEFT  Result Date: 10/06/2021 CLINICAL DATA:  Status post LEFT lumpectomy in march 2023 with radiation for a papillary lesion with atypia which was upgraded to low-grade DCIS with negative margins. Status post RIGHT mastectomy. EXAM: DIGITAL DIAGNOSTIC UNILATERAL LEFT MAMMOGRAM WITH TOMOSYNTHESIS TECHNIQUE: Left digital diagnostic mammography and breast tomosynthesis was performed. COMPARISON:  Previous exam(s). ACR Breast Density Category b: There are scattered areas of fibroglandular density. FINDINGS: There is density and architectural distortion within the LEFT breast,  consistent with postsurgical changes. These are new in comparison to prior, consistent with interval lumpectomy. Vascular calcifications are noted. No suspicious mass, distortion, or microcalcifications are identified to suggest presence of malignancy. IMPRESSION: No mammographic evidence of malignancy. RECOMMENDATION: Recommend LEFT diagnostic mammogram (with ultrasound as deemed necessary) in 1 year. I have discussed the findings and recommendations with the patient. If applicable, a reminder letter will be sent to the patient regarding the next appointment. BI-RADS CATEGORY  2: Benign. Electronically Signed   By: Valentino Saxon M.D.   On: 10/06/2021 15:25   Assessment & Plan:  Ductal carcinoma in situ (DCIS) of left breast # LEFT BREAST DCIS- ER POSITIVE s/p Lumpectomty [s/p WBRT]; start Anastrazole '1mg'$ /day.    # Discussed the mechanism of action of aromatase inhibitors-with blocking of estrogen to prevent breast cancer.  Also discussed the potential side effects including but not limited to arthralgias hot flashes and increased risk of osteoporosis. Will order BMD in 3 months or so. Ordered anastrazole.  Recommend for 5 years.   #  Hx of right breast cancer/DICS [[facility]] s/p mastectomy- Clinically no evidence of recurrence.    # Intermittent Thrombocytosis platelets 600 [NOV 2021]; repeat platelet count 407 [normal 400]-suspect reactive rather than primary bone marrow process.  JAK2; CAL mutation negative. Monitor for now.    # hx of smoking-consider lung cancer screening program if eligible.   #Genetics: Personal history of breast cancer; father with stomach cancer; daughter with breast cancer-will need genetic referral.  Genetic referral made.   #Social issues: Anxiety-recommend evaluation with Education officer, museum.   # DISPOSITION: # Education officer, museum referral re: social/financial issues # genetics referral re: breast cancer  # follow up in 3 months- MD; labs- cbc/cmp; BMD prior-- Dr.B  No  problem-specific Assessment & Plan notes found for this encounter.       Verlon Au, NP 11/01/2021 2:51 PM

## 2021-11-01 NOTE — Patient Instructions (Signed)
Please start taking calcium 1200 mg and vitamin D 1000 iu daily. Exercise as you are able to help strengthen your bones.   For vaginal dryness with intercourse, consider using a lubricating jelly such as KY jelly. Please let me know if you have ongoing dryness and we can discuss other treatments.   It was a pleasure meeting you today and thank you for allowing me to participate in your care.  -Beckey Rutter, NP

## 2021-11-06 ENCOUNTER — Encounter: Payer: Self-pay | Admitting: Licensed Clinical Social Worker

## 2021-11-06 ENCOUNTER — Inpatient Hospital Stay: Payer: Medicare Other | Admitting: Licensed Clinical Social Worker

## 2021-11-06 DIAGNOSIS — D0512 Intraductal carcinoma in situ of left breast: Secondary | ICD-10-CM

## 2021-11-06 NOTE — Progress Notes (Signed)
[place] Work  Initial Assessment   Gail Stevenson is a 66 y.o. year old female contacted by phone. Clinical Social Work was referred by medical provider for assessment of psychosocial needs.   SDOH (Social Determinants of Health) assessments performed: Yes SDOH Interventions    Flowsheet Row Clinical Support from 11/06/2021 in [place] at [place] Interventions   Food Insecurity Interventions Intervention Not Indicated  Housing Interventions Intervention Not Indicated  Transportation Interventions CCAR Lucianne Lei ([place]. Only), Patient Resources (Friends/Family)  [patient is active with [place] services]  Utilities Interventions Intervention Not Indicated  Alcohol Usage Interventions Intervention Not Indicated (Score <7)  Depression Interventions/Treatment  Counseling  Financial Strain Interventions Intervention Not Indicated, [id number] Referral  Physical Activity Interventions Intervention Not Indicated  Stress Interventions Intervention Not Indicated, Provide Counseling  Social Connections Interventions Intervention Not Indicated       SDOH Screenings   Food Insecurity: No Food Insecurity (11/06/2021)  Housing: Medium Risk (11/06/2021)  Transportation Needs: Unmet Transportation Needs (11/06/2021)  Utilities: Not At Risk (11/06/2021)  Alcohol Screen: Low Risk  (11/06/2021)  Depression (PHQ2-9): Low Risk  (11/06/2021)  Financial Resource Strain: High Risk (11/06/2021)  Physical Activity: Insufficiently Active (11/06/2021)  Social Connections: Socially Integrated (11/06/2021)  Stress: Stress Concern Present (11/06/2021)  Tobacco Use: Medium Risk (11/01/2021)     Distress Screen completed: No     No data to display            Family/Social Information:  Housing Arrangement: patient lives with spouse,  main contact and caregiver daughter Earline [place] [phone] Family members/support persons in your life? Family, Friends,  Social worker, and Geophysical data processor concerns: yes, patient active with [facility] assistance, and daughter will assist when she is available  Employment: Disabled  .  Income source: [place] concerns: Yes, current concerns Type of concern: Utilities, Rent/ mortgage, and Phone Food access concerns: no Religious or spiritual practice: Yes-  Services Currently in place:  [employer] Medicare, Medicaid, [place] transportation van  Coping/ Adjustment to diagnosis: Patient understands treatment plan and what happens next? yes Concerns about diagnosis and/or treatment: Pain or discomfort during procedures, Overwhelmed by information, Afraid of cancer, How will I care for myself, and Quality of life Patient reported stressors: Housing, Publishing rights manager, Transportation, Depression, Anxiety/ nervousness, and Adjusting to my illness Hopes and/or priorities: N/A Patient enjoys time with family/ friends and church activities Current coping skills/ strengths: Average or above average intelligence , Capable of independent living , Motivation for treatment/growth , Religious Affiliation , Special hobby/interest , and Supportive family/friends     SUMMARY: Current SDOH Barriers:  Financial constraints related to fixed income, Transportation, Level of care concerns, and Mental Health Concerns   Clinical Social Work Clinical Goal(s):  Patient will fill out application for breast cancer financial assistance programs.  Interventions: Discussed common feeling and emotions when being diagnosed with cancer, and the importance of support during treatment Informed patient of the support team roles and support services at [facility] Provided CSW contact information and encouraged patient to call with any questions or concerns Referred patient to Designer, jewellery and Provided patient with information about CSW role in patient care, available resource, [facility] calendar and activities.   CSW will email information to [email or web address]   Follow Up Plan: Patient will contact CSW with any support or resource needs Patient verbalizes understanding of plan: Yes    Sladen Plancarte, LCSW

## 2021-11-09 ENCOUNTER — Encounter: Payer: Self-pay | Admitting: Nurse Practitioner

## 2021-11-09 ENCOUNTER — Encounter: Payer: Self-pay | Admitting: *Deleted

## 2021-11-09 NOTE — Progress Notes (Signed)
Nurse navigator page of [facility] application filled out and returned to the patient via email.   [place]art message sent with above info.

## 2021-12-11 ENCOUNTER — Other Ambulatory Visit: Payer: Self-pay

## 2021-12-11 ENCOUNTER — Emergency Department
Admission: EM | Admit: 2021-12-11 | Discharge: 2021-12-11 | Disposition: A | Discharge status: Home / Self Care | Payer: Medicare Other | Attending: Emergency Medicine | Admitting: Emergency Medicine

## 2021-12-11 DIAGNOSIS — E119 Type 2 diabetes mellitus without complications: Secondary | ICD-10-CM | POA: Diagnosis not present

## 2021-12-11 DIAGNOSIS — I10 Essential (primary) hypertension: Secondary | ICD-10-CM | POA: Diagnosis not present

## 2021-12-11 DIAGNOSIS — D72829 Elevated white blood cell count, unspecified: Secondary | ICD-10-CM | POA: Insufficient documentation

## 2021-12-11 DIAGNOSIS — R55 Syncope and collapse: Secondary | ICD-10-CM

## 2021-12-11 DIAGNOSIS — J40 Bronchitis, not specified as acute or chronic: Secondary | ICD-10-CM | POA: Diagnosis not present

## 2021-12-11 DIAGNOSIS — R059 Cough, unspecified: Secondary | ICD-10-CM | POA: Diagnosis present

## 2021-12-11 LAB — CBC
HCT: 38.3 % (ref 36.0–46.0)
Hemoglobin: 12.1 g/dL (ref 12.0–15.0)
MCH: 29.8 pg (ref 26.0–34.0)
MCHC: 31.6 g/dL (ref 30.0–36.0)
MCV: 94.3 fL (ref 80.0–100.0)
Platelets: 301 10*3/uL (ref 150–400)
RBC: 4.06 MIL/uL (ref 3.87–5.11)
RDW: 12.3 % (ref 11.5–15.5)
WBC: 13.4 10*3/uL — ABNORMAL HIGH (ref 4.0–10.5)
nRBC: 0 % (ref 0.0–0.2)

## 2021-12-11 LAB — BASIC METABOLIC PANEL
Anion gap: 11 (ref 5–15)
BUN: 22 mg/dL (ref 8–23)
CO2: 28 mmol/L (ref 22–32)
Calcium: 9.1 mg/dL (ref 8.9–10.3)
Chloride: 106 mmol/L (ref 98–111)
Creatinine, Ser: 1.16 mg/dL — ABNORMAL HIGH (ref 0.44–1.00)
GFR, Estimated: 52 mL/min — ABNORMAL LOW (ref >60)
Glucose, Bld: 212 mg/dL — ABNORMAL HIGH (ref 70–99)
Potassium: 4.3 mmol/L (ref 3.5–5.1)
Sodium: 145 mmol/L (ref 135–145)

## 2021-12-11 LAB — URINALYSIS, ROUTINE W REFLEX MICROSCOPIC
Bacteria, UA: NONE SEEN
Bilirubin Urine: NEGATIVE
Glucose, UA: 50 mg/dL — AB
Hgb urine dipstick: NEGATIVE
Ketones, ur: NEGATIVE mg/dL
Nitrite: NEGATIVE
Protein, ur: 100 mg/dL — AB
Specific Gravity, Urine: 1.024 (ref 1.005–1.030)
pH: 5 (ref 5.0–8.0)

## 2021-12-11 MED ORDER — BENZONATATE 100 MG PO CAPS
100.0000 mg | ORAL_CAPSULE | Freq: Once | ORAL | Status: DC
  Filled 2021-12-11: qty 1

## 2021-12-11 MED ORDER — CARVEDILOL 6.25 MG PO TABS
12.5000 mg | ORAL_TABLET | ORAL | Status: AC
  Administered 2021-12-11: 12.5 mg via ORAL
  Filled 2021-12-11: qty 2

## 2021-12-11 MED ORDER — AZITHROMYCIN 250 MG PO TABS: ORAL_TABLET | ORAL | 0 refills | Status: AC

## 2021-12-11 NOTE — ED Provider Notes (Signed)
[facility] Provider Note    Event Date/Time   First MD Initiated Contact with Patient 12/11/21 1412     (approximate)  History   Chief Complaint: Loss of Consciousness  HPI  Gail Stevenson is a 66 y.o. female with a past medical history of asthma, diabetes, hypertension, presents to the emergency department for cough congestion as well as near syncopal episode.  According to the patient for the last 2 days she has been coughing with occasional sputum production.  Denies any fever.  She also states she has been experiencing intermittent near syncopal episodes.  She states these are fairly typical for her.  She had 1 last night, which again is not uncommon but the patient states today she was coughing so she came to the emergency department for evaluation.  Physical Exam   Triage Vital Signs: ED Triage Vitals  Enc Vitals Group     BP 12/11/21 1025 (!) 181/78     Pulse Rate 12/11/21 1025 87     Resp 12/11/21 1025 18     Temp 12/11/21 1025 97.9 F (36.6 C)     Temp Source 12/11/21 1025 Oral     SpO2 12/11/21 1025 94 %     Weight 12/11/21 1021 190 lb (86.2 kg)     Height 12/11/21 1021 '5\' 1"'$  (1.549 m)     Head Circumference --      Peak Flow --      Pain Score --      Pain Loc --      Pain Edu? --      Excl. in [place]? --     Most recent vital signs: Vitals:   12/11/21 1333 12/11/21 1437  BP: (!) 207/82 (!) 171/80  Pulse: (!) 108 98  Resp: 16   Temp:    SpO2: 100%     General: Awake, no distress.  CV:  Good peripheral perfusion.  Regular rate and rhythm  Resp:  Normal effort.  Equal breath sounds bilaterally.  Abd:  No distention.  Soft, nontender.  No rebound or guarding. Other:  Occasional cough during exam.  Clear lung sounds without wheeze rales or rhonchi.   ED Results / Procedures / Treatments   EKG  EKG viewed and interpreted by myself shows a normal sinus rhythm 88 bpm with a narrow QRS, normal axis, normal intervals, nonspecific but  no concerning ST changes.   MEDICATIONS ORDERED IN ED: Medications  benzonatate (TESSALON) capsule 100 mg (100 mg Oral Patient Refused/Not Given 12/11/21 1436)  carvedilol (COREG) tablet 12.5 mg (12.5 mg Oral Given 12/11/21 1437)     IMPRESSION / MDM / ASSESSMENT AND PLAN / ED COURSE  I reviewed the triage vital signs and the nursing notes.  Patient's presentation is most consistent with acute presentation with potential threat to life or bodily function.  Patient presents emergency department for cough and congestion over the past 2 days as well as intermittent near syncopal episodes.  Overall the patient appears well, no distress.  She does have an occasional cough but clear lung sounds without wheeze or rhonchi.  Patient's work-up is reassuring with an overall reassuring CBC with just a slight leukocytosis, reassuring chemistry and a normal urinalysis.  EKG is reassuring as well.  Patient states a history of asthma states she frequently will develop bronchitis due to her asthma to which she believes is going on currently.  Given the patient's history of asthma I do believe it is reasonable to treat  with a 5-day course of Zithromax have the patient follow-up with her doctor.  Patient agreeable to plan of care.  FINAL CLINICAL IMPRESSION(S) / ED DIAGNOSES   Bronchitis Near syncope  Rx / DC Orders   Zithromax  Note:  This document was prepared using Dragon voice recognition software and may include unintentional dictation errors.   Harvest Dark, MD 12/11/21 1446

## 2021-12-11 NOTE — ED Notes (Signed)
Reviewed discharge papers with patient with understadning. Unable to sign for papers d/t failure of writing pad. Escorted patient to lobby. Alert and oriented. VSS.

## 2021-12-11 NOTE — ED Triage Notes (Signed)
Pt c/o dizziness, syncope x 2 last night and this AM while walking, intermiitent chest tightness x 1 month. Pt denies falling as someone caught her prior to hitting the floor. Pt states "I just start seeing stars and blackout, this has happened to me before plenty of times, I am diabetic".

## 2021-12-27 ENCOUNTER — Inpatient Hospital Stay: Payer: Medicare Other | Attending: Oncology

## 2021-12-27 ENCOUNTER — Encounter: Payer: Self-pay | Admitting: Radiation Oncology

## 2021-12-27 ENCOUNTER — Ambulatory Visit
Admission: RE | Admit: 2021-12-27 | Discharge: 2021-12-27 | Disposition: A | Discharge status: Home / Self Care | Payer: Medicare Other | Source: Ambulatory Visit | Attending: Radiation Oncology | Admitting: Radiation Oncology

## 2021-12-27 DIAGNOSIS — Z170 Estrogen receptor positive status [ER+]: Secondary | ICD-10-CM | POA: Insufficient documentation

## 2021-12-27 DIAGNOSIS — Z79811 Long term (current) use of aromatase inhibitors: Secondary | ICD-10-CM | POA: Diagnosis not present

## 2021-12-27 DIAGNOSIS — Z923 Personal history of irradiation: Secondary | ICD-10-CM | POA: Diagnosis not present

## 2021-12-27 DIAGNOSIS — D0512 Intraductal carcinoma in situ of left breast: Secondary | ICD-10-CM | POA: Insufficient documentation

## 2021-12-27 NOTE — Progress Notes (Signed)
Radiation Oncology Follow up Note  Name: Gail Stevenson   Date:   12/27/2021 MRN:  [id number] DOB: 12/21/1955    This 66 y.o. 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  Seen today in routine follow-up she is doing well.  She specifically denies breast tenderness cough or bone pain..  She mammograms back in August which I have reviewed were BI-RADS 2 benign.  She is currently on Arimidex tolerating it well without side effect.  COMPLICATIONS OF TREATMENT: none  FOLLOW UP COMPLIANCE: keeps appointments   PHYSICAL EXAM:  There were no vitals taken for this visit. Lungs are clear to A&P cardiac examination essentially unremarkable with regular rate and rhythm. No dominant mass or nodularity is noted in either breast in 2 positions examined. Incision is well-healed. No axillary or supraclavicular adenopathy is appreciated. Cosmetic result is excellent.  Well-developed well-nourished patient in NAD. HEENT reveals PERLA, EOMI, discs not visualized.  Oral cavity is clear. No oral mucosal lesions are identified. Neck is clear without evidence of cervical or supraclavicular adenopathy. Lungs are clear to A&P. Cardiac examination is essentially unremarkable with regular rate and rhythm without murmur rub or thrill. Abdomen is benign with no organomegaly or masses noted. Motor sensory and DTR levels are equal and symmetric in the upper and lower extremities. Cranial nerves II through XII are grossly intact. Proprioception is intact. No peripheral adenopathy or edema is identified. No motor or sensory levels are noted. Crude visual fields are within normal range.  RADIOLOGY RESULTS: Mammograms  reviewed compatible with above-stated findings  PLAN: Present time patient is doing well 6 months out with no evidence of disease.  I am pleased with her overall progress.  I have asked to see her back in 6 months for follow-up.  She continues on Arimidex without side effect.  Patient knows to call with any concerns.  I would like to take this opportunity to thank you for allowing me to participate in the care of your patient..Noreene Filbert MD

## 2021-12-28 ENCOUNTER — Telehealth: Payer: Self-pay | Admitting: *Deleted

## 2021-12-28 NOTE — Telephone Encounter (Signed)
Patient called stating that Dr Baruch Gouty told her yesterday that a woman would be calling her this morning about seeing someone and having an xray for her wheezing and she has not heard from any one. Please advise

## 2022-01-02 ENCOUNTER — Encounter: Payer: Self-pay | Admitting: Pulmonary Disease

## 2022-01-02 ENCOUNTER — Ambulatory Visit (INDEPENDENT_AMBULATORY_CARE_PROVIDER_SITE_OTHER): Payer: Medicare Other | Admitting: Pulmonary Disease

## 2022-01-02 VITALS — BP 160/92 | HR 80 | Temp 98.0°F | Ht 61.0 in | Wt 193.6 lb

## 2022-01-02 DIAGNOSIS — J209 Acute bronchitis, unspecified: Secondary | ICD-10-CM

## 2022-01-02 DIAGNOSIS — J45998 Other asthma: Secondary | ICD-10-CM

## 2022-01-02 MED ORDER — AZITHROMYCIN 250 MG PO TABS: ORAL_TABLET | ORAL | 0 refills | Status: DC

## 2022-01-02 MED ORDER — AZITHROMYCIN 200 MG/5ML PO SUSR: 250.0000 mg | Freq: Every day | ORAL | 0 refills | Status: AC

## 2022-01-02 NOTE — Progress Notes (Deleted)
   Subjective:    Patient ID: Gail Stevenson, female    DOB: Dec 17, 1955, 66 y.o.   MRN: [id number]  HPI    Review of Systems     Objective:   Physical Exam        Assessment & Plan:
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
## 2022-01-02 NOTE — Patient Instructions (Signed)
For now I would take the purple inhaler (Advair) or the Wixela (gray inhaler) 1 puff twice a day.  These 2 inhalers are the same.  The Grant Ruts is just the generic form of your purple inhaler.  You do not to take them both just 1 or the other.  You can use your small red inhaler as needed if you are short of breath up to 4 times a day.  But this is only as needed.  This red inhaler is called ProAir or albuterol.  You do not need to take any other inhalers.  We sent in a prescription of an antibiotic for your bronchitis.  We are scheduling breathing tests.  We will see him in follow-up in 3 months time call sooner should any new problems arise.

## 2022-01-24 ENCOUNTER — Inpatient Hospital Stay: Payer: Medicare Other | Attending: Oncology | Admitting: Licensed Clinical Social Worker

## 2022-01-24 DIAGNOSIS — D0512 Intraductal carcinoma in situ of left breast: Secondary | ICD-10-CM

## 2022-01-24 NOTE — Progress Notes (Signed)
[facility] CSW Progress Note  Holiday representative contacted caregiver by phone to discuss email received from [employer].  Patient's main caregiver/daughter Ms. Lucia Gaskins stated she also received the email as well, with the update staing the patient did not meet eligibility for financial assistance from the [employer].     Adelene Amas, LCSW

## 2022-02-01 ENCOUNTER — Inpatient Hospital Stay: Payer: Medicare Other | Admitting: Oncology

## 2022-02-01 ENCOUNTER — Inpatient Hospital Stay: Payer: Medicare Other

## 2022-02-11 ENCOUNTER — Other Ambulatory Visit: Payer: Self-pay | Admitting: Pulmonary Disease

## 2022-03-05 ENCOUNTER — Inpatient Hospital Stay: Payer: Medicaid Other

## 2022-03-05 ENCOUNTER — Inpatient Hospital Stay: Payer: Medicaid Other | Admitting: Medical Oncology

## 2022-03-05 ENCOUNTER — Telehealth: Payer: Self-pay | Admitting: Internal Medicine

## 2022-03-05 NOTE — Telephone Encounter (Signed)
Gail Stevenson went to pick patient up for appointment. She did not want to come to appointment- I called patient to see why-  Patient states she is dizzy and has diarrhea- appointment has been rescheduled and patient notified.

## 2022-03-09 ENCOUNTER — Other Ambulatory Visit: Payer: Self-pay | Admitting: Internal Medicine

## 2022-03-14 ENCOUNTER — Inpatient Hospital Stay: Payer: Medicaid Other

## 2022-03-14 ENCOUNTER — Inpatient Hospital Stay: Payer: Medicaid Other | Admitting: Medical Oncology

## 2022-03-17 ENCOUNTER — Other Ambulatory Visit: Payer: Self-pay | Admitting: Surgery

## 2022-03-20 ENCOUNTER — Encounter: Payer: Self-pay | Admitting: Surgery

## 2022-03-20 ENCOUNTER — Other Ambulatory Visit: Payer: Self-pay

## 2022-03-21 ENCOUNTER — Inpatient Hospital Stay (HOSPITAL_BASED_OUTPATIENT_CLINIC_OR_DEPARTMENT_OTHER): Payer: 59 | Admitting: Medical Oncology

## 2022-03-21 ENCOUNTER — Inpatient Hospital Stay: Payer: 59 | Attending: Oncology

## 2022-03-21 ENCOUNTER — Inpatient Hospital Stay: Payer: 59

## 2022-03-21 ENCOUNTER — Encounter: Payer: Self-pay | Admitting: Medical Oncology

## 2022-03-21 VITALS — BP 173/91 | HR 80 | Temp 97.5°F | Wt 198.8 lb

## 2022-03-21 DIAGNOSIS — D75839 Thrombocytosis, unspecified: Secondary | ICD-10-CM

## 2022-03-21 DIAGNOSIS — I10 Essential (primary) hypertension: Secondary | ICD-10-CM | POA: Diagnosis not present

## 2022-03-21 DIAGNOSIS — M8580 Other specified disorders of bone density and structure, unspecified site: Secondary | ICD-10-CM | POA: Insufficient documentation

## 2022-03-21 DIAGNOSIS — D0512 Intraductal carcinoma in situ of left breast: Secondary | ICD-10-CM | POA: Insufficient documentation

## 2022-03-21 DIAGNOSIS — R7989 Other specified abnormal findings of blood chemistry: Secondary | ICD-10-CM

## 2022-03-21 DIAGNOSIS — Z170 Estrogen receptor positive status [ER+]: Secondary | ICD-10-CM | POA: Insufficient documentation

## 2022-03-21 DIAGNOSIS — E559 Vitamin D deficiency, unspecified: Secondary | ICD-10-CM | POA: Insufficient documentation

## 2022-03-21 DIAGNOSIS — Z5181 Encounter for therapeutic drug level monitoring: Secondary | ICD-10-CM

## 2022-03-21 DIAGNOSIS — Z79811 Long term (current) use of aromatase inhibitors: Secondary | ICD-10-CM

## 2022-03-21 DIAGNOSIS — Z79899 Other long term (current) drug therapy: Secondary | ICD-10-CM | POA: Diagnosis not present

## 2022-03-21 DIAGNOSIS — Z08 Encounter for follow-up examination after completed treatment for malignant neoplasm: Secondary | ICD-10-CM | POA: Diagnosis not present

## 2022-03-21 DIAGNOSIS — Z7951 Long term (current) use of inhaled steroids: Secondary | ICD-10-CM | POA: Insufficient documentation

## 2022-03-21 DIAGNOSIS — Z794 Long term (current) use of insulin: Secondary | ICD-10-CM | POA: Insufficient documentation

## 2022-03-21 DIAGNOSIS — Z853 Personal history of malignant neoplasm of breast: Secondary | ICD-10-CM

## 2022-03-21 DIAGNOSIS — Z803 Family history of malignant neoplasm of breast: Secondary | ICD-10-CM | POA: Diagnosis not present

## 2022-03-21 DIAGNOSIS — R232 Flushing: Secondary | ICD-10-CM | POA: Diagnosis not present

## 2022-03-21 DIAGNOSIS — E119 Type 2 diabetes mellitus without complications: Secondary | ICD-10-CM | POA: Diagnosis not present

## 2022-03-21 DIAGNOSIS — Z9221 Personal history of antineoplastic chemotherapy: Secondary | ICD-10-CM | POA: Insufficient documentation

## 2022-03-21 DIAGNOSIS — Z800 Family history of malignant neoplasm of digestive organs: Secondary | ICD-10-CM | POA: Insufficient documentation

## 2022-03-21 DIAGNOSIS — Z7982 Long term (current) use of aspirin: Secondary | ICD-10-CM | POA: Diagnosis not present

## 2022-03-21 LAB — COMPREHENSIVE METABOLIC PANEL
ALT: 35 U/L (ref 0–44)
AST: 33 U/L (ref 15–41)
Albumin: 4 g/dL (ref 3.5–5.0)
Alkaline Phosphatase: 106 U/L (ref 38–126)
Anion gap: 9 (ref 5–15)
BUN: 25 mg/dL — ABNORMAL HIGH (ref 8–23)
CO2: 27 mmol/L (ref 22–32)
Calcium: 9.3 mg/dL (ref 8.9–10.3)
Chloride: 104 mmol/L (ref 98–111)
Creatinine, Ser: 1.32 mg/dL — ABNORMAL HIGH (ref 0.44–1.00)
GFR, Estimated: 44 mL/min — ABNORMAL LOW (ref >60)
Glucose, Bld: 119 mg/dL — ABNORMAL HIGH (ref 70–99)
Potassium: 3.9 mmol/L (ref 3.5–5.1)
Sodium: 140 mmol/L (ref 135–145)
Total Bilirubin: 0.2 mg/dL — ABNORMAL LOW (ref 0.3–1.2)
Total Protein: 7.6 g/dL (ref 6.5–8.1)

## 2022-03-21 LAB — CBC WITH DIFFERENTIAL/PLATELET
Abs Immature Granulocytes: 0.04 10*3/uL (ref 0.00–0.07)
Basophils Absolute: 0 10*3/uL (ref 0.0–0.1)
Basophils Relative: 0 %
Eosinophils Absolute: 0.1 10*3/uL (ref 0.0–0.5)
Eosinophils Relative: 1 %
HCT: 38.3 % (ref 36.0–46.0)
Hemoglobin: 12.3 g/dL (ref 12.0–15.0)
Immature Granulocytes: 0 %
Lymphocytes Relative: 24 %
Lymphs Abs: 2.3 10*3/uL (ref 0.7–4.0)
MCH: 30 pg (ref 26.0–34.0)
MCHC: 32.1 g/dL (ref 30.0–36.0)
MCV: 93.4 fL (ref 80.0–100.0)
Monocytes Absolute: 1 10*3/uL (ref 0.1–1.0)
Monocytes Relative: 11 %
Neutro Abs: 6.3 10*3/uL (ref 1.7–7.7)
Neutrophils Relative %: 64 %
Platelets: 325 10*3/uL (ref 150–400)
RBC: 4.1 MIL/uL (ref 3.87–5.11)
RDW: 12.4 % (ref 11.5–15.5)
WBC: 9.8 10*3/uL (ref 4.0–10.5)
nRBC: 0 % (ref 0.0–0.2)

## 2022-03-21 MED ORDER — ERGOCALCIFEROL 1.25 MG (50000 UT) PO CAPS: 50000.0000 [IU] | ORAL_CAPSULE | ORAL | 3 refills | Status: DC
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
## 2022-04-16 ENCOUNTER — Ambulatory Visit (INDEPENDENT_AMBULATORY_CARE_PROVIDER_SITE_OTHER): Payer: 59 | Admitting: Licensed Clinical Social Worker

## 2022-04-16 DIAGNOSIS — Z634 Disappearance and death of family member: Secondary | ICD-10-CM

## 2022-04-16 DIAGNOSIS — F4310 Post-traumatic stress disorder, unspecified: Secondary | ICD-10-CM | POA: Diagnosis not present

## 2022-04-16 DIAGNOSIS — F331 Major depressive disorder, recurrent, moderate: Secondary | ICD-10-CM | POA: Diagnosis not present

## 2022-04-16 DIAGNOSIS — F632 Kleptomania: Secondary | ICD-10-CM | POA: Diagnosis not present
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
## 2022-05-01 ENCOUNTER — Inpatient Hospital Stay: Payer: 59 | Attending: Oncology

## 2022-05-01 ENCOUNTER — Inpatient Hospital Stay: Payer: 59

## 2022-05-01 ENCOUNTER — Inpatient Hospital Stay (HOSPITAL_BASED_OUTPATIENT_CLINIC_OR_DEPARTMENT_OTHER): Payer: 59 | Admitting: Internal Medicine

## 2022-05-01 ENCOUNTER — Encounter: Payer: Self-pay | Admitting: Internal Medicine

## 2022-05-01 VITALS — BP 165/80 | HR 75 | Temp 97.7°F | Resp 18 | Wt 190.4 lb

## 2022-05-01 DIAGNOSIS — D75839 Thrombocytosis, unspecified: Secondary | ICD-10-CM | POA: Diagnosis not present

## 2022-05-01 DIAGNOSIS — E119 Type 2 diabetes mellitus without complications: Secondary | ICD-10-CM | POA: Insufficient documentation

## 2022-05-01 DIAGNOSIS — Z803 Family history of malignant neoplasm of breast: Secondary | ICD-10-CM | POA: Diagnosis not present

## 2022-05-01 DIAGNOSIS — Z800 Family history of malignant neoplasm of digestive organs: Secondary | ICD-10-CM | POA: Insufficient documentation

## 2022-05-01 DIAGNOSIS — I10 Essential (primary) hypertension: Secondary | ICD-10-CM | POA: Diagnosis not present

## 2022-05-01 DIAGNOSIS — Z9011 Acquired absence of right breast and nipple: Secondary | ICD-10-CM | POA: Insufficient documentation

## 2022-05-01 DIAGNOSIS — D0512 Intraductal carcinoma in situ of left breast: Secondary | ICD-10-CM | POA: Insufficient documentation

## 2022-05-01 DIAGNOSIS — Z87891 Personal history of nicotine dependence: Secondary | ICD-10-CM | POA: Insufficient documentation

## 2022-05-01 DIAGNOSIS — Z79811 Long term (current) use of aromatase inhibitors: Secondary | ICD-10-CM | POA: Diagnosis not present

## 2022-05-01 MED ORDER — CLOTRIMAZOLE 1 % EX CREA: 1.0000 | TOPICAL_CREAM | Freq: Two times a day (BID) | CUTANEOUS | 0 refills | Status: DC

## 2022-05-01 NOTE — Progress Notes (Signed)
[facility] NOTE  Patient Care Team: Jonelle Sports, MD as PCP - General (Obstetrics and Gynecology) Cammie Sickle, MD as Consulting Physician (Oncology) Noreene Filbert, MD as Consulting Physician (Radiation Oncology) Tyler Pita, MD as Consulting Physician (Pulmonary Disease)  CHIEF COMPLAINTS/PURPOSE OF CONSULTATION: DCIS/ Thrombocytosis.  Oncology History Overview Note   # DCIS LEFT [MAY 2023- s/p WBRT [Dr.Crystal.]; ER POSITIVE; JUNE 2023-start anastrozole.  # hx of breast cancer s/p mastectomy [[facility]]  # THROMBOCYTOSIS [NOv 2021 605 -platelets- ; Hb;10.5 white count-WNL]; MARCH 2022-JAK-2NEG; CALR-NEG.BCR-ABL-NEGATIVE.     Ductal carcinoma in situ (DCIS) of left breast  04/15/2015 Initial Diagnosis   Ductal carcinoma in situ (DCIS) of left breast   05/08/2021 Cancer Staging   Staging form: Breast, AJCC 7th Edition - Clinical stage from 05/08/2021: Stage 0 (Tis (DCIS), N0, M0) - Signed by Lloyd Huger, MD on 05/08/2021    HISTORY OF PRESENTING ILLNESS: Patient ambulating-independently with assistance.  Alone.  Gail Stevenson 67 y.o.  female pleasant prior history of breast cancer on the right side-s/p mastectomy; and currently with left DCIS s/p radiation  is here for follow-up.  Patient left breast soreness for last month.  Noticed black spots on inner thigh for past week.  Denies any nausea vomiting.  Denies any fevers or chills. C/o hand and hip pain, feeling like needles in her feet.   Review of Systems  Constitutional:  Positive for malaise/fatigue. Negative for chills, diaphoresis, fever and weight loss.  HENT:  Negative for nosebleeds and sore throat.   Eyes:  Negative for double vision.  Respiratory:  Negative for cough, hemoptysis, sputum production, shortness of breath and wheezing.   Cardiovascular:  Negative for chest pain, palpitations, orthopnea and leg swelling.  Gastrointestinal:  Negative for abdominal pain,  blood in stool, constipation, diarrhea, heartburn, melena, nausea and vomiting.  Genitourinary:  Negative for dysuria, frequency and urgency.  Musculoskeletal:  Positive for back pain and joint pain.  Skin: Negative.  Negative for itching and rash.  Neurological:  Negative for dizziness, tingling, focal weakness, weakness and headaches.  Endo/Heme/Allergies:  Does not bruise/bleed easily.  Psychiatric/Behavioral:  Negative for depression. The patient is not nervous/anxious and does not have insomnia.      MEDICAL HISTORY:  Past Medical History:  Diagnosis Date   Asthma    Breast cancer ([place])    Diabetes mellitus without complication ([place])    Type 2   Diastolic dysfunction    Diverticulitis    Esophageal varices without bleeding (HCC)    Fothergill's neuralgia    Hemorrhoids    Hepatitis C    History of breast cancer 2003   right side total mastectomy- DCIS   Hypertension    Leg swelling    Leukocytosis    LVH (left ventricular hypertrophy)    Morbid obesity ([place])    Osteopenia    Personal history of chemotherapy    Sepsis ([place])    SIRS (systemic inflammatory response syndrome) (HCC)    Thrombocytosis    Trigeminal neuralgia     SURGICAL HISTORY: Past Surgical History:  Procedure Laterality Date   BREAST BIOPSY Left 03/14/2021   [place] bx ribbon marker,differential diagnosisi of intraductal papilloma with ADH or DCIS   BREAST LUMPECTOMY WITH RADIOFREQUENCY TAG IDENTIFICATION Left [id number]   Procedure: BREAST LUMPECTOMY WITH RADIOFREQUENCY TAG IDENTIFICATION;  Surgeon: Olean Ree, MD;  Location: [facility];  Service: General;  Laterality: Left;   BREAST SURGERY Right 2003   total mastectomy  COLONOSCOPY  2019   LAPAROTOMY N/A 01/25/2019   Procedure: EXPLORATORY LAPAROTOMY, ILEOCECECTOMY;  Surgeon: Georganna Skeans, MD;  Location: [facility];  Service: General;  Laterality: N/A;   MASTECTOMY Right 2003   total   UPPER GI ENDOSCOPY      SOCIAL HISTORY: Social History    Socioeconomic History   Marital status: Divorced    Spouse name: Not on file   Number of children: 1   Years of education: Not on file   Highest education level: Not on file  Occupational History   Not on file  Tobacco Use   Smoking status: Former    Packs/day: 0.50    Years: 10.00    Additional pack years: 0.00    Total pack years: 5.00    Types: Cigarettes    Quit date: 05/17/1992    Years since quitting: 29.9   Smokeless tobacco: Never  Vaping Use   Vaping Use: Never used  Substance and Sexual Activity   Alcohol use: Not Currently    Comment: Occasional beer   Drug use: No   Sexual activity: Yes  Other Topics Concern   Not on file  Social History Narrative   Lives with deceased daughter's boyfriend and ex-husband in [place]; Daughter assists in medical care. Hx of smoking; recovering alcohol. Used to work-house keeping/construction.    Social Determinants of Health   Financial Resource Strain: High Risk (11/06/2021)   Overall Financial Resource Strain (CARDIA)    Difficulty of Paying Living Expenses: Hard  Food Insecurity: No Food Insecurity (11/06/2021)   Hunger Vital Sign    Worried About Running Out of Food in the Last Year: Never true    Ran Out of Food in the Last Year: Never true  Transportation Needs: Unmet Transportation Needs (05/01/2022)   PRAPARE - Hydrologist (Medical): Yes    Lack of Transportation (Non-Medical): Yes  Physical Activity: Insufficiently Active (11/06/2021)   Exercise Vital Sign    Days of Exercise per Week: 3 days    Minutes of Exercise per Session: 30 min  Stress: Stress Concern Present (11/06/2021)   [place]    Feeling of Stress : To some extent  Social Connections: Socially Integrated (11/06/2021)   Social Connection and Isolation Panel [NHANES]    Frequency of Communication with Friends and Family: More than three times a week     Frequency of Social Gatherings with Friends and Family: More than three times a week    Attends Religious Services: More than 4 times per year    Active Member of [employer] or Organizations: Yes    Attends Archivist Meetings: 1 to 4 times per year    Marital Status: Married  Human resources officer Violence: Not At Risk (11/06/2021)   Humiliation, Afraid, Rape, and Kick questionnaire    Fear of Current or Ex-Partner: No    Emotionally Abused: No    Physically Abused: No    Sexually Abused: No    FAMILY HISTORY: Family History  Problem Relation Age of Onset   Gastric cancer Father 39   Breast cancer Maternal Aunt    Breast cancer Daughter 77   Stomach cancer Other    Hypertension Neg Hx    Stroke Neg Hx     ALLERGIES:  is allergic to metformin, patanol [olopatadine], and paroxetine hcl.  MEDICATIONS:  Current Outpatient Medications  Medication Sig Dispense Refill   acetaminophen (TYLENOL) 160 MG/5ML liquid  Take 500 mg by mouth every 4 (four) hours as needed for fever.     acetaminophen (TYLENOL) 500 MG tablet Take 2 tablets (1,000 mg total) by mouth every 6 (six) hours as needed for mild pain.     anastrozole (ARIMIDEX) 1 MG tablet TAKE 1 TABLET BY MOUTH EVERY DAY 90 tablet 3   aspirin EC 81 MG tablet Take 81 mg by mouth daily.     atorvastatin (LIPITOR) 40 MG tablet Take 40 mg by mouth at bedtime.      carvedilol (COREG) 12.5 MG tablet Take 12.5 mg by mouth 2 (two) times daily.     clotrimazole (LOTRIMIN AF) 1 % cream Apply 1 Application topically 2 (two) times daily. 30 g 0   ENTRESTO 24-26 MG Take 1 tablet by mouth 2 (two) times daily.     fluticasone-salmeterol (ADVAIR) 250-50 MCG/ACT AEPB Inhale 1 puff into the lungs in the morning and at bedtime.     gabapentin (NEURONTIN) 100 MG capsule Take 100 mg by mouth 2 (two) times daily.     ibuprofen (ADVIL) 600 MG tablet Take 1 tablet (600 mg total) by mouth every 8 (eight) hours as needed for moderate pain. 60 tablet 1   LANTUS  SOLOSTAR 100 UNIT/ML Solostar Pen Inject 56 Units into the skin at bedtime.     levalbuterol (XOPENEX HFA) 45 MCG/ACT inhaler Inhale 2 puffs into the lungs every 4 (four) hours as needed for wheezing.     loperamide (IMODIUM) 2 MG capsule Take 2 mg by mouth 4 (four) times daily as needed for diarrhea or loose stools.     montelukast (SINGULAIR) 10 MG tablet Take 10 mg by mouth at bedtime.     Multiple Vitamin (MULTIVITAMIN WITH MINERALS) TABS tablet Take 4 tablets by mouth daily.     PROAIR HFA 108 (90 Base) MCG/ACT inhaler Inhale 2 puffs into the lungs every 6 (six) hours as needed for wheezing or shortness of breath.   5   ergocalciferol (VITAMIN D2) 1.25 MG (50000 UT) capsule Take 1 capsule (50,000 Units total) by mouth once a week. (Patient not taking: Reported on 05/01/2022) 4 capsule 3   omeprazole (PRILOSEC) 20 MG capsule Take 20 mg by mouth daily. (Patient not taking: Reported on 05/01/2022)     potassium chloride (KLOR-CON) 20 MEQ packet Take 20 mEq by mouth 2 (two) times daily for 5 days. (Patient not taking: Reported on 11/01/2021) 10 packet 0   silver sulfADIAZINE (SILVADENE) 1 % cream Apply 1 Application topically 2 (two) times daily. (Patient not taking: Reported on 05/01/2022) 50 g 1   No current facility-administered medications for this visit.     PHYSICAL EXAMINATION:   Vitals:   05/01/22 1500  BP: (!) 165/80  Pulse: 75  Resp: 18  Temp: 97.7 F (36.5 C)   Filed Weights   05/01/22 1500  Weight: 190 lb 6.4 oz (86.4 kg)   Left BREAST exam [in the presence of nurse]- no unusual skin changes or dominant masses felt. Surgical scars noted.    Right mastectomy scar-no lumps or bumps.  Bilateral thighs-ringworm infection.  Physical Exam Constitutional:      Comments: Ambulating independently.   HENT:     Head: Normocephalic and atraumatic.     Mouth/Throat:     Pharynx: No oropharyngeal exudate.  Eyes:     Pupils: Pupils are equal, round, and reactive to light.   Cardiovascular:     Rate and Rhythm: Normal rate and regular rhythm.  Pulmonary:     Effort: Pulmonary effort is normal. No respiratory distress.     Breath sounds: Normal breath sounds. No wheezing.  Abdominal:     General: Bowel sounds are normal. There is no distension.     Palpations: Abdomen is soft. There is no mass.     Tenderness: There is no abdominal tenderness. There is no guarding or rebound.  Musculoskeletal:        General: No tenderness. Normal range of motion.     Cervical back: Normal range of motion and neck supple.  Skin:    General: Skin is warm.  Neurological:     Mental Status: She is alert and oriented to person, place, and time.  Psychiatric:        Mood and Affect: Affect normal.      LABORATORY DATA:  I have reviewed the data as listed Lab Results  Component Value Date   WBC 9.8 03/21/2022   HGB 12.3 03/21/2022   HCT 38.3 03/21/2022   MCV 93.4 03/21/2022   PLT 325 03/21/2022   Recent Labs    10/20/21 1324 11/01/21 1336 12/11/21 1035 03/21/22 1032  NA 141 138 145 140  K 3.2* 3.9 4.3 3.9  CL 106 103 106 104  CO2 25 28 28 27   GLUCOSE 144* 120* 212* 119*  BUN 16 24* 22 25*  CREATININE 1.11* 1.15* 1.16* 1.32*  CALCIUM 8.9 9.0 9.1 9.3  GFRNONAA 55* 53* 52* 44*  PROT 7.8 7.8  --  7.6  ALBUMIN 4.0 4.0  --  4.0  AST 22 30  --  33  ALT 19 26  --  35  ALKPHOS 84 92  --  106  BILITOT 0.4 0.4  --  0.2*  BILIDIR 0.1  --   --   --   IBILI 0.3  --   --   --      No results found.    Ductal carcinoma in situ (DCIS) of left breast # LEFT BREAST DCIS- ER POSITIVE s/p Lumpectomty [s/p WBRT]; continue Anastrazole 1mg /day. Tolerating well except for joint pain. UNI LEFT Mammogram- AUg 2023 [dr.Piscoya- wnl. ] stable; no evidence of any recurrence.   # Skin rash/ ring worm- on Bil things- recommend lotrimin- script sent to pharmacy.   # Hx of right breast cancer/DICS [[facility]] s/p mastectomy- Clinically no evidence of recurrence.   # Intermittent  Thrombocytosis platelets 600 [NOV 2021]; repeat platelet count 407 [normal 400]-suspect reactive rather than primary bone marrow process.  JAK2; CAL mutation negative. Monitor for now.   # 2023- BMD- Osteopenia- T score: - 1.7- continue ca+ vit D.  Discuss.   #Genetics: Personal history of breast cancer; father with stomach cancer; daughter with breast cancer-will need genetic referral.  Genetic referral made.  # DISPOSITION: # follow up in 6 months- MD: labs- cbc/cmp; vit D 25-OH-  Dr.B    Cammie Sickle, MD 05/01/2022 4:11 PM

## 2022-05-01 NOTE — Addendum Note (Signed)
Addended by: Sofie Rower A on: 05/01/2022 04:22 PM   Modules accepted: Orders

## 2022-05-01 NOTE — Progress Notes (Signed)
Patient left breast soreness for last month.  Noticed black spots on inner thigh for past week.  Complains of arthritis pain 10/10 today.

## 2022-05-01 NOTE — Assessment & Plan Note (Signed)
#   LEFT BREAST DCIS- ER POSITIVE s/p Lumpectomty [s/p WBRT]; continue Anastrazole 1mg /day. Tolerating well except for joint pain. UNI LEFT Mammogram- AUg 2023 [dr.Piscoya- wnl. ] stable; no evidence of any recurrence.   # Skin rash/ ring worm- on Bil things- recommend lotrimin- script sent to pharmacy.   # Hx of right breast cancer/DICS [[facility]] s/p mastectomy- Clinically no evidence of recurrence.   # Intermittent Thrombocytosis platelets 600 [NOV 2021]; repeat platelet count 407 [normal 400]-suspect reactive rather than primary bone marrow process.  JAK2; CAL mutation negative. Monitor for now.   # 2023- BMD- Osteopenia- T score: - 1.7- continue ca+ vit D.  Discuss.   #Genetics: Personal history of breast cancer; father with stomach cancer; daughter with breast cancer-will need genetic referral.  Genetic referral made.  # DISPOSITION: # follow up in 6 months- MD: labs- cbc/cmp; vit D 25-OH-  Dr.B

## 2022-05-09 ENCOUNTER — Ambulatory Visit (INDEPENDENT_AMBULATORY_CARE_PROVIDER_SITE_OTHER): Payer: 59 | Admitting: Licensed Clinical Social Worker

## 2022-05-09 DIAGNOSIS — F4310 Post-traumatic stress disorder, unspecified: Secondary | ICD-10-CM | POA: Diagnosis not present

## 2022-05-09 DIAGNOSIS — F331 Major depressive disorder, recurrent, moderate: Secondary | ICD-10-CM

## 2022-05-09 DIAGNOSIS — F632 Kleptomania: Secondary | ICD-10-CM | POA: Diagnosis not present

## 2022-05-14 DIAGNOSIS — F4310 Post-traumatic stress disorder, unspecified: Secondary | ICD-10-CM | POA: Insufficient documentation
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
## 2022-06-04 ENCOUNTER — Ambulatory Visit (INDEPENDENT_AMBULATORY_CARE_PROVIDER_SITE_OTHER): Payer: 59 | Admitting: Licensed Clinical Social Worker

## 2022-06-04 ENCOUNTER — Telehealth: Payer: Self-pay | Admitting: Licensed Clinical Social Worker

## 2022-06-04 DIAGNOSIS — Z91199 Patient's noncompliance with other medical treatment and regimen due to unspecified reason: Secondary | ICD-10-CM

## 2022-06-04 NOTE — Progress Notes (Signed)
LCSW called pt at 3:06PM and was unable to leave a vm inquiring about pt attendance at today's appt. LCSW called pt's daughter at 3:07PM to inquire about pt attendance at today's appt. Pt's daughter reported that she reminded pt of appt and thinks pt may be sick and unable to attend. Requested that pt's daughter encourage pt to call front desk to schedule next f/u appt.

## 2022-06-04 NOTE — Telephone Encounter (Signed)
LCSW called pt at 3:06PM and was unable to leave a vm inquiring about pt attendance at today's appt. LCSW called pt's daughter at 3:07PM to inquire about pt attendance at today's appt. Pt's daughter reported that she reminded pt of appt and thinks pt may be sick and unable to attend. Requested that pt's daughter encourage pt to call front desk to schedule next f/u appt.  

## 2022-06-05 NOTE — Addendum Note (Signed)
Addended by: Allayne Butcher on: 06/05/2022 10:01 AM   Modules accepted: Level of Service

## 2022-06-13 ENCOUNTER — Encounter: Payer: Self-pay | Admitting: Emergency Medicine

## 2022-06-13 ENCOUNTER — Emergency Department: Payer: 59

## 2022-06-13 ENCOUNTER — Inpatient Hospital Stay
Admission: EM | Admit: 2022-06-13 | Discharge: 2022-06-15 | DRG: 178 | Disposition: A | Discharge status: Home / Self Care | Payer: 59 | Attending: Internal Medicine | Admitting: Internal Medicine

## 2022-06-13 ENCOUNTER — Other Ambulatory Visit: Payer: Self-pay

## 2022-06-13 DIAGNOSIS — I2510 Atherosclerotic heart disease of native coronary artery without angina pectoris: Secondary | ICD-10-CM | POA: Diagnosis present

## 2022-06-13 DIAGNOSIS — K529 Noninfective gastroenteritis and colitis, unspecified: Secondary | ICD-10-CM | POA: Diagnosis present

## 2022-06-13 DIAGNOSIS — Z79899 Other long term (current) drug therapy: Secondary | ICD-10-CM | POA: Diagnosis not present

## 2022-06-13 DIAGNOSIS — B182 Chronic viral hepatitis C: Secondary | ICD-10-CM | POA: Diagnosis present

## 2022-06-13 DIAGNOSIS — Z6836 Body mass index (BMI) 36.0-36.9, adult: Secondary | ICD-10-CM

## 2022-06-13 DIAGNOSIS — Z9011 Acquired absence of right breast and nipple: Secondary | ICD-10-CM | POA: Diagnosis not present

## 2022-06-13 DIAGNOSIS — F32A Depression, unspecified: Secondary | ICD-10-CM | POA: Diagnosis present

## 2022-06-13 DIAGNOSIS — Z794 Long term (current) use of insulin: Secondary | ICD-10-CM | POA: Diagnosis not present

## 2022-06-13 DIAGNOSIS — Z853 Personal history of malignant neoplasm of breast: Secondary | ICD-10-CM

## 2022-06-13 DIAGNOSIS — Z9221 Personal history of antineoplastic chemotherapy: Secondary | ICD-10-CM | POA: Diagnosis not present

## 2022-06-13 DIAGNOSIS — E1169 Type 2 diabetes mellitus with other specified complication: Secondary | ICD-10-CM | POA: Diagnosis present

## 2022-06-13 DIAGNOSIS — E785 Hyperlipidemia, unspecified: Secondary | ICD-10-CM | POA: Diagnosis present

## 2022-06-13 DIAGNOSIS — J4540 Moderate persistent asthma, uncomplicated: Secondary | ICD-10-CM | POA: Diagnosis present

## 2022-06-13 DIAGNOSIS — Z1152 Encounter for screening for COVID-19: Secondary | ICD-10-CM | POA: Diagnosis not present

## 2022-06-13 DIAGNOSIS — Z87891 Personal history of nicotine dependence: Secondary | ICD-10-CM | POA: Diagnosis not present

## 2022-06-13 DIAGNOSIS — N390 Urinary tract infection, site not specified: Secondary | ICD-10-CM | POA: Diagnosis present

## 2022-06-13 DIAGNOSIS — Z8619 Personal history of other infectious and parasitic diseases: Secondary | ICD-10-CM

## 2022-06-13 DIAGNOSIS — A419 Sepsis, unspecified organism: Secondary | ICD-10-CM

## 2022-06-13 DIAGNOSIS — Z9049 Acquired absence of other specified parts of digestive tract: Secondary | ICD-10-CM

## 2022-06-13 DIAGNOSIS — J690 Pneumonitis due to inhalation of food and vomit: Principal | ICD-10-CM | POA: Diagnosis present

## 2022-06-13 DIAGNOSIS — M8580 Other specified disorders of bone density and structure, unspecified site: Secondary | ICD-10-CM | POA: Diagnosis present

## 2022-06-13 DIAGNOSIS — Z7982 Long term (current) use of aspirin: Secondary | ICD-10-CM | POA: Diagnosis not present

## 2022-06-13 DIAGNOSIS — Z888 Allergy status to other drugs, medicaments and biological substances status: Secondary | ICD-10-CM

## 2022-06-13 DIAGNOSIS — Z79811 Long term (current) use of aromatase inhibitors: Secondary | ICD-10-CM

## 2022-06-13 DIAGNOSIS — R1110 Vomiting, unspecified: Secondary | ICD-10-CM | POA: Diagnosis present

## 2022-06-13 DIAGNOSIS — Z803 Family history of malignant neoplasm of breast: Secondary | ICD-10-CM

## 2022-06-13 DIAGNOSIS — E1159 Type 2 diabetes mellitus with other circulatory complications: Secondary | ICD-10-CM | POA: Diagnosis present

## 2022-06-13 DIAGNOSIS — I152 Hypertension secondary to endocrine disorders: Secondary | ICD-10-CM | POA: Diagnosis present

## 2022-06-13 DIAGNOSIS — E119 Type 2 diabetes mellitus without complications: Secondary | ICD-10-CM

## 2022-06-13 DIAGNOSIS — J4541 Moderate persistent asthma with (acute) exacerbation: Secondary | ICD-10-CM | POA: Diagnosis present

## 2022-06-13 DIAGNOSIS — J45901 Unspecified asthma with (acute) exacerbation: Secondary | ICD-10-CM | POA: Diagnosis not present

## 2022-06-13 DIAGNOSIS — R072 Precordial pain: Secondary | ICD-10-CM | POA: Diagnosis present

## 2022-06-13 DIAGNOSIS — E876 Hypokalemia: Secondary | ICD-10-CM | POA: Diagnosis present

## 2022-06-13 DIAGNOSIS — Z800 Family history of malignant neoplasm of digestive organs: Secondary | ICD-10-CM

## 2022-06-13 DIAGNOSIS — J189 Pneumonia, unspecified organism: Principal | ICD-10-CM

## 2022-06-13 LAB — BASIC METABOLIC PANEL WITH GFR
Anion gap: 8 (ref 5–15)
BUN: 22 mg/dL (ref 8–23)
CO2: 25 mmol/L (ref 22–32)
Calcium: 8.9 mg/dL (ref 8.9–10.3)
Chloride: 105 mmol/L (ref 98–111)
Creatinine, Ser: 1.42 mg/dL — ABNORMAL HIGH (ref 0.44–1.00)
GFR, Estimated: 41 mL/min — ABNORMAL LOW (ref >60)
Glucose, Bld: 199 mg/dL — ABNORMAL HIGH (ref 70–99)
Potassium: 3.4 mmol/L — ABNORMAL LOW (ref 3.5–5.1)
Sodium: 138 mmol/L (ref 135–145)

## 2022-06-13 LAB — URINALYSIS, COMPLETE (UACMP) WITH MICROSCOPIC
Bacteria, UA: NONE SEEN
Bilirubin Urine: NEGATIVE
Glucose, UA: >=500 mg/dL — AB
Hgb urine dipstick: NEGATIVE
Ketones, ur: NEGATIVE mg/dL
Leukocytes,Ua: NEGATIVE
Nitrite: NEGATIVE
Protein, ur: NEGATIVE mg/dL
Specific Gravity, Urine: 1.023 (ref 1.005–1.030)
pH: 5 (ref 5.0–8.0)

## 2022-06-13 LAB — CBC
HCT: 35.4 % — ABNORMAL LOW (ref 36.0–46.0)
Hemoglobin: 11.6 g/dL — ABNORMAL LOW (ref 12.0–15.0)
MCH: 29.4 pg (ref 26.0–34.0)
MCHC: 32.8 g/dL (ref 30.0–36.0)
MCV: 89.8 fL (ref 80.0–100.0)
Platelets: 323 10*3/uL (ref 150–400)
RBC: 3.94 MIL/uL (ref 3.87–5.11)
RDW: 12.6 % (ref 11.5–15.5)
WBC: 14.1 10*3/uL — ABNORMAL HIGH (ref 4.0–10.5)
nRBC: 0 % (ref 0.0–0.2)

## 2022-06-13 LAB — PROTIME-INR
INR: 1.3 — ABNORMAL HIGH (ref 0.8–1.2)
Prothrombin Time: 15.9 seconds — ABNORMAL HIGH (ref 11.4–15.2)

## 2022-06-13 LAB — CBG MONITORING, ED
Glucose-Capillary: 191 mg/dL — ABNORMAL HIGH (ref 70–99)
Glucose-Capillary: 169 mg/dL — ABNORMAL HIGH (ref 70–99)

## 2022-06-13 LAB — PROCALCITONIN: Procalcitonin: <0.1 ng/mL

## 2022-06-13 LAB — LACTIC ACID, PLASMA
Lactic Acid, Venous: 1.7 mmol/L (ref 0.5–1.9)
Lactic Acid, Venous: 1.1 mmol/L (ref 0.5–1.9)

## 2022-06-13 LAB — COMPREHENSIVE METABOLIC PANEL
ALT: 17 U/L (ref 0–44)
AST: 21 U/L (ref 15–41)
Albumin: 3.4 g/dL — ABNORMAL LOW (ref 3.5–5.0)
Alkaline Phosphatase: 78 U/L (ref 38–126)
Anion gap: 8 (ref 5–15)
BUN: 22 mg/dL (ref 8–23)
CO2: 24 mmol/L (ref 22–32)
Calcium: 8.9 mg/dL (ref 8.9–10.3)
Chloride: 106 mmol/L (ref 98–111)
Creatinine, Ser: 1.35 mg/dL — ABNORMAL HIGH (ref 0.44–1.00)
GFR, Estimated: 43 mL/min — ABNORMAL LOW (ref >60)
Glucose, Bld: 191 mg/dL — ABNORMAL HIGH (ref 70–99)
Potassium: 3.3 mmol/L — ABNORMAL LOW (ref 3.5–5.1)
Sodium: 138 mmol/L (ref 135–145)
Total Bilirubin: 0.6 mg/dL (ref 0.3–1.2)
Total Protein: 7.3 g/dL (ref 6.5–8.1)

## 2022-06-13 LAB — RESP PANEL BY RT-PCR (RSV, FLU A&B, COVID)  RVPGX2
Influenza A by PCR: NEGATIVE
Influenza B by PCR: NEGATIVE
Resp Syncytial Virus by PCR: NEGATIVE
SARS Coronavirus 2 by RT PCR: NEGATIVE

## 2022-06-13 LAB — MAGNESIUM: Magnesium: 0.8 mg/dL — CL (ref 1.7–2.4)

## 2022-06-13 LAB — TROPONIN I (HIGH SENSITIVITY)
Troponin I (High Sensitivity): 10 ng/L (ref <18)
Troponin I (High Sensitivity): 5 ng/L (ref <18)

## 2022-06-13 LAB — APTT: aPTT: 35 seconds (ref 24–36)

## 2022-06-13 MED ORDER — SODIUM CHLORIDE 0.9 % IV SOLN: INTRAVENOUS | Status: DC

## 2022-06-13 MED ORDER — MOMETASONE FURO-FORMOTEROL FUM 200-5 MCG/ACT IN AERO
2.0000 | INHALATION_SPRAY | Freq: Two times a day (BID) | RESPIRATORY_TRACT | Status: DC
  Administered 2022-06-13 – 2022-06-15 (×4): 2 via RESPIRATORY_TRACT
  Filled 2022-06-13: qty 8.8

## 2022-06-13 MED ORDER — INSULIN ASPART 100 UNIT/ML IJ SOLN
0.0000 [IU] | Freq: Every day | INTRAMUSCULAR | Status: DC
  Administered 2022-06-14: 3 [IU] via SUBCUTANEOUS
  Filled 2022-06-13: qty 1

## 2022-06-13 MED ORDER — GABAPENTIN 100 MG PO CAPS
100.0000 mg | ORAL_CAPSULE | Freq: Two times a day (BID) | ORAL | Status: DC
  Administered 2022-06-13 – 2022-06-15 (×4): 100 mg via ORAL
  Filled 2022-06-13 (×4): qty 1

## 2022-06-13 MED ORDER — ONDANSETRON HCL 4 MG/2ML IJ SOLN: 4.0000 mg | Freq: Four times a day (QID) | INTRAMUSCULAR | Status: DC | PRN

## 2022-06-13 MED ORDER — IPRATROPIUM-ALBUTEROL 0.5-2.5 (3) MG/3ML IN SOLN: 3.0000 mL | Freq: Four times a day (QID) | RESPIRATORY_TRACT | Status: DC | PRN

## 2022-06-13 MED ORDER — SODIUM CHLORIDE 0.9 % IV SOLN: 2.0000 g | INTRAVENOUS | Status: DC

## 2022-06-13 MED ORDER — ASPIRIN 81 MG PO TBEC
81.0000 mg | DELAYED_RELEASE_TABLET | Freq: Every day | ORAL | Status: DC
  Administered 2022-06-14 – 2022-06-15 (×2): 81 mg via ORAL
  Filled 2022-06-13 (×2): qty 1

## 2022-06-13 MED ORDER — MONTELUKAST SODIUM 10 MG PO TABS
10.0000 mg | ORAL_TABLET | Freq: Every day | ORAL | Status: DC
  Administered 2022-06-13 – 2022-06-14 (×2): 10 mg via ORAL
  Filled 2022-06-13 (×2): qty 1

## 2022-06-13 MED ORDER — ANASTROZOLE 1 MG PO TABS
1.0000 mg | ORAL_TABLET | Freq: Every day | ORAL | Status: DC
  Administered 2022-06-13 – 2022-06-15 (×3): 1 mg via ORAL
  Filled 2022-06-13 (×3): qty 1

## 2022-06-13 MED ORDER — NIFEDIPINE ER OSMOTIC RELEASE 30 MG PO TB24
30.0000 mg | ORAL_TABLET | Freq: Every day | ORAL | Status: DC
  Administered 2022-06-14 – 2022-06-15 (×2): 30 mg via ORAL
  Filled 2022-06-13 (×2): qty 1

## 2022-06-13 MED ORDER — ACETAMINOPHEN 650 MG RE SUPP: 650.0000 mg | Freq: Four times a day (QID) | RECTAL | Status: DC | PRN

## 2022-06-13 MED ORDER — SODIUM CHLORIDE 0.9 % IV SOLN
2.0000 g | INTRAVENOUS | Status: DC
  Administered 2022-06-13 – 2022-06-14 (×2): 2 g via INTRAVENOUS
  Filled 2022-06-13 (×2): qty 20

## 2022-06-13 MED ORDER — ACETAMINOPHEN 325 MG PO TABS: 650.0000 mg | ORAL_TABLET | Freq: Four times a day (QID) | ORAL | Status: DC | PRN

## 2022-06-13 MED ORDER — POTASSIUM CHLORIDE CRYS ER 20 MEQ PO TBCR
30.0000 meq | EXTENDED_RELEASE_TABLET | Freq: Once | ORAL | Status: AC
  Administered 2022-06-13: 30 meq via ORAL
  Filled 2022-06-13: qty 2

## 2022-06-13 MED ORDER — ATORVASTATIN CALCIUM 20 MG PO TABS
40.0000 mg | ORAL_TABLET | Freq: Every day | ORAL | Status: DC
  Administered 2022-06-13 – 2022-06-14 (×2): 40 mg via ORAL
  Filled 2022-06-13 (×2): qty 2

## 2022-06-13 MED ORDER — SODIUM CHLORIDE 0.9 % IV SOLN: 500.0000 mg | INTRAVENOUS | Status: DC

## 2022-06-13 MED ORDER — SACUBITRIL-VALSARTAN 24-26 MG PO TABS
1.0000 | ORAL_TABLET | Freq: Two times a day (BID) | ORAL | Status: DC
  Filled 2022-06-13: qty 1

## 2022-06-13 MED ORDER — FLUCONAZOLE NICU/PED ORAL SYRINGE 10 MG/ML
100.0000 mg | ORAL | Status: DC
  Administered 2022-06-13: 100 mg via ORAL
  Filled 2022-06-13 (×2): qty 10

## 2022-06-13 MED ORDER — SODIUM CHLORIDE 0.9 % IV BOLUS (SEPSIS)
500.0000 mL | Freq: Once | INTRAVENOUS | Status: AC
  Administered 2022-06-13: 500 mL via INTRAVENOUS

## 2022-06-13 MED ORDER — MORPHINE SULFATE (PF) 2 MG/ML IV SOLN: 2.0000 mg | INTRAVENOUS | Status: DC | PRN

## 2022-06-13 MED ORDER — ONDANSETRON HCL 4 MG PO TABS
4.0000 mg | ORAL_TABLET | Freq: Four times a day (QID) | ORAL | Status: DC | PRN
  Filled 2022-06-13: qty 1

## 2022-06-13 MED ORDER — LOPERAMIDE HCL 2 MG PO CAPS: 2.0000 mg | ORAL_CAPSULE | Freq: Four times a day (QID) | ORAL | Status: DC | PRN

## 2022-06-13 MED ORDER — SACUBITRIL-VALSARTAN 24-26 MG PO TABS
1.0000 | ORAL_TABLET | Freq: Two times a day (BID) | ORAL | Status: DC
  Administered 2022-06-14 – 2022-06-15 (×3): 1 via ORAL
  Filled 2022-06-13 (×3): qty 1

## 2022-06-13 MED ORDER — FLUCONAZOLE 40 MG/ML PO SUSR
100.0000 mg | Freq: Every day | ORAL | Status: DC
  Filled 2022-06-13: qty 2.5

## 2022-06-13 MED ORDER — BENZONATATE 100 MG PO CAPS: 100.0000 mg | ORAL_CAPSULE | Freq: Two times a day (BID) | ORAL | Status: DC | PRN

## 2022-06-13 MED ORDER — METRONIDAZOLE 0.75 % VA GEL
1.0000 | Freq: Every day | VAGINAL | Status: DC
  Administered 2022-06-13 – 2022-06-14 (×2): 1 via VAGINAL
  Filled 2022-06-13: qty 70

## 2022-06-13 MED ORDER — INSULIN ASPART 100 UNIT/ML IJ SOLN
0.0000 [IU] | Freq: Three times a day (TID) | INTRAMUSCULAR | Status: DC
  Administered 2022-06-14 (×2): 3 [IU] via SUBCUTANEOUS
  Administered 2022-06-14: 1 [IU] via SUBCUTANEOUS
  Administered 2022-06-15: 5 [IU] via SUBCUTANEOUS
  Administered 2022-06-15: 3 [IU] via SUBCUTANEOUS
  Filled 2022-06-13 (×4): qty 1

## 2022-06-13 MED ORDER — HYDRALAZINE HCL 20 MG/ML IJ SOLN: 5.0000 mg | Freq: Three times a day (TID) | INTRAMUSCULAR | Status: DC | PRN

## 2022-06-13 MED ORDER — SODIUM CHLORIDE 0.9 % IV BOLUS (SEPSIS)
1000.0000 mL | Freq: Once | INTRAVENOUS | Status: AC
  Administered 2022-06-13: 1000 mL via INTRAVENOUS

## 2022-06-13 MED ORDER — MAGNESIUM SULFATE 2 GM/50ML IV SOLN
2.0000 g | Freq: Once | INTRAVENOUS | Status: AC
  Administered 2022-06-13: 2 g via INTRAVENOUS
  Filled 2022-06-13: qty 50

## 2022-06-13 MED ORDER — SODIUM CHLORIDE 0.9 % IV SOLN
500.0000 mg | INTRAVENOUS | Status: DC
  Administered 2022-06-13 – 2022-06-14 (×2): 500 mg via INTRAVENOUS
  Filled 2022-06-13 (×2): qty 5

## 2022-06-13 MED ORDER — CARVEDILOL 12.5 MG PO TABS
12.5000 mg | ORAL_TABLET | Freq: Two times a day (BID) | ORAL | Status: DC
  Administered 2022-06-14 – 2022-06-15 (×3): 12.5 mg via ORAL
  Filled 2022-06-13: qty 2
  Filled 2022-06-13 (×2): qty 1

## 2022-06-13 MED ORDER — KETOROLAC TROMETHAMINE 15 MG/ML IJ SOLN: 15.0000 mg | Freq: Four times a day (QID) | INTRAMUSCULAR | Status: DC | PRN

## 2022-06-13 MED ORDER — ENOXAPARIN SODIUM 40 MG/0.4ML IJ SOSY
40.0000 mg | PREFILLED_SYRINGE | INTRAMUSCULAR | Status: DC
  Administered 2022-06-13 – 2022-06-14 (×2): 40 mg via SUBCUTANEOUS
  Filled 2022-06-13 (×2): qty 0.4

## 2022-06-13 NOTE — ED Notes (Signed)
Patient keeps disconnecting from monitoring devices to go to bathroom and does not reconnect each time. Been informed to call out for someone to help her multiple times.

## 2022-06-13 NOTE — Hospital Course (Addendum)
Gail Stevenson is a 67 year old female with hypertension, hyperlipidemia, CAD, insulin-dependent diabetes mellitus, morbid obesity, history of bilateral breast cancer status postmastectomy on the right and lumpectomy on the left, who presents emergency department for chief concerns of chest pain.  Vitals in the ED showed temperature of 98.6, respiration rate of 20, heart rate 68, blood pressure 119/48, SpO2 100% on room air.  Serum sodium is 138, potassium 3.3, chloride 106, bicarb 24, BUN of 22, serum creatinine 1.35, EGFR 43, nonfasting blood glucose 191, WBC 14.1, hemoglobin 11.6, platelets of 323.  Lactic acid is 1.7.  High sensitive troponin is 10.  UA was negative for leukocytes and nitrates.  ED treatment: Azithromycin 500 mg IV one-time dose, ceftriaxone 2 g IV one-time dose, 5 days were ordered, sodium chloride 1.5 L bolus and sodium chloride 150 mL/h infusion were ordered.

## 2022-06-13 NOTE — ED Notes (Signed)
Patient c/o continued chest pain. Patients vitals rechecked by this RN. Asked patient to stay in wheelchair. Patient given box of tissues as requested.

## 2022-06-13 NOTE — Assessment & Plan Note (Signed)
-   Atorvastatin 40 mg nightly resumed 

## 2022-06-13 NOTE — Assessment & Plan Note (Addendum)
Nifedipine 30 mg daily, Entresto 24-26 p.o. twice daily, carvedilol 12.5 mg were resumed 06/14/2022

## 2022-06-13 NOTE — H&P (Addendum)
History and Physical   Gail Stevenson ZOX:[id number] DOB: 21-Aug-1955 DOA: 06/13/2022  PCP: Al Corpus, MD Patient coming from: Home  I have personally briefly reviewed patient's old medical records in [facility] EMR.  Chief Concern: Chest pain and shortness of breath  HPI: Ms. Gail Stevenson is a 67 year old female with hypertension, hyperlipidemia, CAD, insulin-dependent diabetes mellitus, morbid obesity, history of bilateral breast cancer status postmastectomy on the right and lumpectomy on the left, who presents emergency department for chief concerns of chest pain.  Vitals in the ED showed temperature of 98.6, respiration rate of 20, heart rate 68, blood pressure 119/48, SpO2 100% on room air.  Serum sodium is 138, potassium 3.3, chloride 106, bicarb 24, BUN of 22, serum creatinine 1.35, EGFR 43, nonfasting blood glucose 191, WBC 14.1, hemoglobin 11.6, platelets of 323.  Lactic acid is 1.7.  High sensitive troponin is 10.  UA was negative for leukocytes and nitrates.  ED treatment: Azithromycin 500 mg IV one-time dose, ceftriaxone 2 g IV one-time dose, 5 days were ordered, sodium chloride 1.5 L bolus and sodium chloride 150 mL/h infusion were ordered. ------------------------- At bedside, patient was able to tell me her name, age, location and current year.  She reports that last night she started having shortness of breath, coughing fits, and chest pain.  She describes the pain as sharp worse when she coughs.  She reports she has been under a lot of stress as there is been some distant family, and friends who decided to move into her home and she is working on legally kicking them out.  She denies known fever at home.  She did endorses chills and subjective feeling of warmth.  She endorses chronic diarrhea that is baseline for her.  She states that dysuria started about 1 week ago and she is currently on antibiotics for UTI.  She denies nausea, vomiting, syncope, loss of  consciousness.  She endorses 1 episode of vomiting yesterday.  Social history: She lives at home.  She denies tobacco, EtOH, recreational drug use.  ROS: Constitutional: no weight change, no fever ENT/Mouth: no sore throat, no rhinorrhea Eyes: no eye pain, no vision changes Cardiovascular: + chest pain, + dyspnea,  no edema, no palpitations Respiratory: no cough, no sputum, no wheezing Gastrointestinal: no nausea, no vomiting, no diarrhea, no constipation Genitourinary: no urinary incontinence, no dysuria, no hematuria Musculoskeletal: no arthralgias, no myalgias Skin: no skin lesions, no pruritus, Neuro: + weakness, no loss of consciousness, no syncope Psych: no anxiety, no depression, + decrease appetite Heme/Lymph: no bruising, no bleeding  ED Course: Discussed with emergency medicine provider, patient requiring hospitalization for chief concerns of pneumonia.  Assessment/Plan  Principal Problem:   Aspiration pneumonia (HCC) Active Problems:   Type 2 diabetes mellitus without complication (HCC)   Hypertension associated with diabetes (HCC)   Morbid obesity (HCC)   Asthma, moderate persistent   Clinical depression   Chronic hepatitis C virus infection (HCC)   S/P small bowel resection   Hyperlipidemia associated with type 2 diabetes mellitus (HCC)   Hypokalemia   Hypomagnesemia   Assessment and Plan:  * Aspiration pneumonia (HCC) Continue ceftriaxone, azithromycin Maintain MAP greater than 65, will follow second lactic acid Admit to telemetry cardiac, inpatient  Hypomagnesemia 2 g magnesium IV one-time dose ordered Recheck magnesium level in the a.m.  Hypokalemia Potassium replaced 30 mill equivalent p.o. one-time dose Check serum magnesium level and procalcitonin on admission  Hyperlipidemia associated with type 2 diabetes mellitus (HCC) Atorvastatin 40  mg nightly resumed  Asthma, moderate persistent Resumed long-acting inhaler DuoNebs as needed for  shortness of breath and wheezing ordered  Morbid obesity (HCC) This meets criteria for morbid obesity based on the presence of 1 or more chronic comorbidities. Patient has BMI of 34.24 and hypertension, IDDM2. This complicates overall care and prognosis.   Hypertension associated with diabetes (HCC) Nifedipine 30 mg daily, Entresto 24-26 p.o. twice daily, carvedilol 12.5 mg were resumed 06/14/2022  Type 2 diabetes mellitus without complication (HCC) Insulin-dependent diabetes mellitus Patient takes Lantus 56 units. Insulin SSI with at bedtime coverage ordered  Chart reviewed.   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   Hypertension    Leg swelling    Leukocytosis    LVH (left ventricular hypertrophy)    Morbid obesity (HCC)    Osteopenia    Personal history of chemotherapy    Sepsis (HCC)    SIRS (systemic inflammatory response syndrome) (HCC)    Thrombocytosis    Trigeminal neuralgia    Past Surgical History:  Procedure Laterality Date   BREAST BIOPSY Left 03/14/2021   [place] bx ribbon marker,differential diagnosisi of intraductal papilloma with ADH or DCIS   BREAST LUMPECTOMY WITH RADIOFREQUENCY TAG IDENTIFICATION Left 04/27/2021   Procedure: BREAST LUMPECTOMY WITH RADIOFREQUENCY TAG IDENTIFICATION;  Surgeon: Henrene Dodge, MD;  Location: [facility] ORS;  Service: General;  Laterality:  Left;   BREAST SURGERY Right 2003   total mastectomy   COLONOSCOPY  2019   LAPAROTOMY N/A 01/25/2019   Procedure: EXPLORATORY LAPAROTOMY, ILEOCECECTOMY;  Surgeon: Violeta Gelinas, MD;  Location: [facility] OR;  Service: General;  Laterality: N/A;   MASTECTOMY Right 2003   total   UPPER GI ENDOSCOPY     Social History:  reports that she quit smoking about 30 years ago. Her smoking use included cigarettes. She has a 5.00 pack-year smoking history. She has never used smokeless tobacco. She reports that she does not currently use alcohol. She reports that she does not use drugs.  Allergies  Allergen Reactions   Metformin Other (See Comments) and Swelling    Unable to walk   Patanol [Olopatadine]     Unknown reaction    Paroxetine Hcl Rash   Family History  Problem Relation Age of Onset   Gastric cancer Father 62   Breast cancer Maternal Aunt    Breast cancer Daughter 4   Stomach cancer Other    Hypertension Neg Hx    Stroke Neg Hx    Family history: Family history reviewed and not pertinent  Prior to Admission medications   Medication Sig Start Date End Date Taking? Authorizing Provider  acetaminophen (TYLENOL) 160 MG/5ML liquid Take 500 mg by mouth every 4 (four) hours as needed for fever.    [provider]  acetaminophen (TYLENOL) 500 MG tablet Take 2 tablets (1,000 mg total) by mouth every 6 (six) hours  as needed for mild pain. 04/27/21   Henrene Dodge, MD  anastrozole (ARIMIDEX) 1 MG tablet TAKE 1 TABLET BY MOUTH EVERY DAY 03/09/22   Earna Coder, MD  aspirin EC 81 MG tablet Take 81 mg by mouth daily.    [provider]  atorvastatin (LIPITOR) 40 MG tablet Take 40 mg by mouth at bedtime.     [provider]  carvedilol (COREG) 12.5 MG tablet Take 12.5 mg by mouth 2 (two) times daily. 03/20/21   [provider]  clotrimazole (LOTRIMIN AF) 1 % cream Apply 1 Application topically 2 (two) times daily. 05/01/22   Earna Coder, MD  ENTRESTO  24-26 MG Take 1 tablet by mouth 2 (two) times daily. 03/09/21   [provider]  ergocalciferol (VITAMIN D2) 1.25 MG (50000 UT) capsule Take 1 capsule (50,000 Units total) by mouth once a week. Patient not taking: Reported on 05/01/2022 03/21/22   Rushie Chestnut, PA-C  fluticasone-salmeterol (ADVAIR) 250-50 MCG/ACT AEPB Inhale 1 puff into the lungs in the morning and at bedtime.    [provider]  gabapentin (NEURONTIN) 100 MG capsule Take 100 mg by mouth 2 (two) times daily. 03/19/21   [provider]  ibuprofen (ADVIL) 600 MG tablet Take 1 tablet (600 mg total) by mouth every 8 (eight) hours as needed for moderate pain. 10/23/21   Piscoya, Elita Quick, MD  LANTUS SOLOSTAR 100 UNIT/ML Solostar Pen Inject 56 Units into the skin at bedtime.    [provider]  levalbuterol Pauline Aus HFA) 45 MCG/ACT inhaler Inhale 2 puffs into the lungs every 4 (four) hours as needed for wheezing.    [provider]  loperamide (IMODIUM) 2 MG capsule Take 2 mg by mouth 4 (four) times daily as needed for diarrhea or loose stools. 03/27/21   [provider]  montelukast (SINGULAIR) 10 MG tablet Take 10 mg by mouth at bedtime. 12/26/18   [provider]  Multiple Vitamin (MULTIVITAMIN WITH MINERALS) TABS tablet Take 4 tablets by mouth daily.    [provider]  omeprazole (PRILOSEC) 20 MG capsule Take 20 mg by mouth daily. Patient not taking: Reported on 05/01/2022 01/16/21   [provider]  potassium chloride (KLOR-CON) 20 MEQ packet Take 20 mEq by mouth 2 (two) times daily for 5 days. Patient not taking: Reported on 11/01/2021 06/26/21 11/01/21  Georga Hacking, MD  PROAIR HFA 108 [phone] Base) MCG/ACT inhaler Inhale 2 puffs into the lungs every 6 (six) hours as needed for wheezing or shortness of breath.     [provider]  silver sulfADIAZINE (SILVADENE) 1 % cream Apply 1 Application topically 2 (two) times daily. Patient not taking: Reported  on 05/01/2022 07/31/21   Earna Coder, MD   Physical Exam: Vitals:   06/13/22 1439 06/13/22 1518 06/13/22 1815 06/13/22 1830  BP:  (!) 105/50  (!) 152/70  Pulse:  71 80 80  Resp:  20 17 14   Temp:    98.5 F (36.9 C)  TempSrc:    Oral  SpO2:  100% 100% 97%  Weight: 87 kg     Height: 5\' 1"  (1.549 m)      Constitutional: appears age-appropriate, NAD, calm Eyes: PERRL, lids and conjunctivae normal ENMT: Mucous membranes are moist. Posterior pharynx clear of any exudate or lesions. Age-appropriate dentition. Hearing appropriate Neck: normal, supple, no masses, no thyromegaly Respiratory: clear to auscultation bilaterally, no wheezing, no crackles.  Mild increase respiratory effort.  Increase accessory muscle use.  Cardiovascular: Regular rate and rhythm, no murmurs / rubs / gallops. No extremity edema. 2+ pedal pulses. No carotid bruits.  Abdomen: no tenderness, no masses palpated, no hepatosplenomegaly. Bowel sounds positive.  Musculoskeletal: no clubbing / cyanosis. No joint deformity upper and lower extremities. Good ROM, no contractures, no atrophy. Normal muscle tone.  Right sided mastectomy. Skin: no rashes, lesions, ulcers. No induration Neurologic: Sensation intact. Strength 5/5 in all 4.  Psychiatric: Normal judgment and insight. Alert and oriented x 3. Normal mood.   EKG: independently reviewed, showing sinus rhythm with rate of 73, QTc 427  Chest x-ray on Admission: I personally reviewed and I agree with radiologist reading as below.  DG Chest 2 View  Result Date: 06/13/2022 CLINICAL DATA:  Left-sided chest pain and bilateral ankle swelling EXAM: CHEST - 2 VIEW COMPARISON:  Chest radiograph dated 10/20/2021 FINDINGS: Normal lung volumes. Bibasilar patchy opacities. No pleural effusion or pneumothorax. The heart size and mediastinal contours are within normal limits. No acute osseous abnormality. Surgical clips. IMPRESSION: Bibasilar patchy opacities, which may reflect  atelectasis, aspiration, or pneumonia. Electronically Signed   By: Agustin Cree M.D.   On: 06/13/2022 15:07    Labs on Admission: I have personally reviewed following labs CBC: Recent Labs  Lab 06/13/22 1440  WBC 14.1*  HGB 11.6*  HCT 35.4*  MCV 89.8  PLT 323   Basic Metabolic Panel: Recent Labs  Lab 06/13/22 1440 06/13/22 1633 06/13/22 1759  NA 138 138  --   K 3.4* 3.3*  --   CL 105 106  --   CO2 25 24  --   GLUCOSE 199* 191*  --   BUN 22 22  --   CREATININE 1.42* 1.35*  --   CALCIUM 8.9 8.9  --   MG  --   --  0.8*   GFR: Estimated Creatinine Clearance: 40.5 mL/min (A) (by C-G formula based on SCr of 1.35 mg/dL (H)).  Liver Function Tests: Recent Labs  Lab 06/13/22 1633  AST 21  ALT 17  ALKPHOS 78  BILITOT 0.6  PROT 7.3  ALBUMIN 3.4*   Urine analysis:    Component Value Date/Time   COLORURINE YELLOW (A) 06/13/2022 1730   APPEARANCEUR CLEAR (A) 06/13/2022 1730   LABSPEC 1.023 06/13/2022 1730   PHURINE 5.0 06/13/2022 1730   GLUCOSEU >=500 (A) 06/13/2022 1730   HGBUR NEGATIVE 06/13/2022 1730   BILIRUBINUR NEGATIVE 06/13/2022 1730   KETONESUR NEGATIVE 06/13/2022 1730   PROTEINUR NEGATIVE 06/13/2022 1730   NITRITE NEGATIVE 06/13/2022 1730   LEUKOCYTESUR NEGATIVE 06/13/2022 1730   This document was prepared using Dragon Voice Recognition software and may include unintentional dictation errors.  Dr. Sedalia Muta [facility]  If 7PM-7AM, please contact overnight-coverage provider If 7AM-7PM, please contact day attending provider [email or web address]  06/13/2022, 7:51 PM

## 2022-06-13 NOTE — Assessment & Plan Note (Signed)
This meets criteria for morbid obesity based on the presence of 1 or more chronic comorbidities. Patient has BMI of 34.24 and hypertension, IDDM2. This complicates overall care and prognosis.

## 2022-06-13 NOTE — Assessment & Plan Note (Addendum)
Potassium replaced 30 mill equivalent p.o. one-time dose Check serum magnesium level and procalcitonin on admission

## 2022-06-13 NOTE — ED Provider Notes (Signed)
[facility] Provider Note   Event Date/Time   First MD Initiated Contact with Patient 06/13/22 [phone]     (approximate) History  Chest Pain  HPI Gail Stevenson is a 67 y.o. female with a past medical history of type 2 diabetes, hypertension, morbid obesity, GERD, and recently diagnosed with a urinary tract infection.  Patient states that she had some coughing after taking her first dose of antibiotics this morning.  Patient also states that she felt like she had burning in the back of her throat upon awakening this morning when this chest pain began.  Patient describes 9/10, aching, lower left chest pain that radiates through to her back. ROS: Patient currently denies any vision changes, tinnitus, difficulty speaking, facial droop, sore throat, shortness of breath, abdominal pain, nausea/vomiting/diarrhea, dysuria, or weakness/numbness/paresthesias in any extremity   Physical Exam  Triage Vital Signs: ED Triage Vitals  Enc Vitals Group     BP 06/13/22 1438 (!) 119/48     Pulse Rate 06/13/22 1438 68     Resp 06/13/22 1438 20     Temp 06/13/22 1438 98.6 F (37 C)     Temp Source 06/13/22 1438 Oral     SpO2 06/13/22 1438 100 %     Weight 06/13/22 1439 191 lb 12.8 oz (87 kg)     Height 06/13/22 1439 5\' 1"  (1.549 m)     Head Circumference --      Peak Flow --      Pain Score 06/13/22 1438 9     Pain Loc --      Pain Edu? --      Excl. in GC? --    Most recent vital signs: Vitals:   06/13/22 2230 06/13/22 2255  BP:  (!) 175/66  Pulse: 83 94  Resp: 15 (!) 23  Temp:  98.4 F (36.9 C)  SpO2: 97% 99%   General: Awake, oriented x4. CV:  Good peripheral perfusion.  Resp:  Increased effort.  Rales over bilateral lung fields Abd:  No distention.  Other:  Elderly obese African-American female laying in bed in mild respiratory distress ED Results / Procedures / Treatments  Labs (all labs ordered are listed, but only abnormal results are displayed) Labs  Reviewed  BASIC METABOLIC PANEL - Abnormal; Notable for the following components:      Result Value   Potassium 3.4 (*)    Glucose, Bld 199 (*)    Creatinine, Ser 1.42 (*)    GFR, Estimated 41 (*)    All other components within normal limits  CBC - Abnormal; Notable for the following components:   WBC 14.1 (*)    Hemoglobin 11.6 (*)    HCT 35.4 (*)    All other components within normal limits  COMPREHENSIVE METABOLIC PANEL - Abnormal; Notable for the following components:   Potassium 3.3 (*)    Glucose, Bld 191 (*)    Creatinine, Ser 1.35 (*)    Albumin 3.4 (*)    GFR, Estimated 43 (*)    All other components within normal limits  URINALYSIS, COMPLETE (UACMP) WITH MICROSCOPIC - Abnormal; Notable for the following components:   Color, Urine YELLOW (*)    APPearance CLEAR (*)    Glucose, UA >=500 (*)    All other components within normal limits  PROTIME-INR - Abnormal; Notable for the following components:   Prothrombin Time 15.9 (*)    INR 1.3 (*)    All other components within normal limits  MAGNESIUM - Abnormal; Notable for the following components:   Magnesium 0.8 (*)    All other components within normal limits  CBG MONITORING, ED - Abnormal; Notable for the following components:   Glucose-Capillary 191 (*)    All other components within normal limits  CBG MONITORING, ED - Abnormal; Notable for the following components:   Glucose-Capillary 169 (*)    All other components within normal limits  RESP PANEL BY RT-PCR (RSV, FLU A&B, COVID)  [id number]  CULTURE, BLOOD (ROUTINE X 2)  CULTURE, BLOOD (ROUTINE X 2)  LACTIC ACID, PLASMA  LACTIC ACID, PLASMA  PROCALCITONIN  APTT  BASIC METABOLIC PANEL  CBC  HEMOGLOBIN A1C  MAGNESIUM  TROPONIN I (HIGH SENSITIVITY)  TROPONIN I (HIGH SENSITIVITY)   EKG ED ECG REPORT I, Merwyn Katos, the attending physician, personally viewed and interpreted this ECG. Date: 06/13/2022 EKG Time: 1434 Rate: 73 Rhythm: normal sinus rhythm QRS  Axis: normal Intervals: normal ST/T Wave abnormalities: normal Narrative Interpretation: no evidence of acute ischemia RADIOLOGY ED MD interpretation: 2 view chest x-ray shows bibasilar patchy opacities which may reflect atelectasis versus aspiration or pneumonia -Agree with radiology assessment Official radiology report(s): DG Chest 2 View  Result Date: 06/13/2022 CLINICAL DATA:  Left-sided chest pain and bilateral ankle swelling EXAM: CHEST - 2 VIEW COMPARISON:  Chest radiograph dated 10/20/2021 FINDINGS: Normal lung volumes. Bibasilar patchy opacities. No pleural effusion or pneumothorax. The heart size and mediastinal contours are within normal limits. No acute osseous abnormality. Surgical clips. IMPRESSION: Bibasilar patchy opacities, which may reflect atelectasis, aspiration, or pneumonia. Electronically Signed   By: Agustin Cree M.D.   On: 06/13/2022 15:07   PROCEDURES: Critical Care performed: Yes, see critical care procedure note(s) .1-3 Lead EKG Interpretation  Performed by: Merwyn Katos, MD Authorized by: Merwyn Katos, MD     Interpretation: normal     ECG rate:  71   ECG rate assessment: normal     Rhythm: sinus rhythm     Ectopy: none     Conduction: normal   CRITICAL CARE Performed by: Merwyn Katos  Total critical care time: 35 minutes  Critical care time was exclusive of separately billable procedures and treating other patients.  Critical care was necessary to treat or prevent imminent or life-threatening deterioration.  Critical care was time spent personally by me on the following activities: development of treatment plan with patient and/or surrogate as well as nursing, discussions with consultants, evaluation of patient's response to treatment, examination of patient, obtaining history from patient or surrogate, ordering and performing treatments and interventions, ordering and review of laboratory studies, ordering and review of radiographic studies, pulse  oximetry and re-evaluation of patient's condition.  MEDICATIONS ORDERED IN ED: Medications  cefTRIAXone (ROCEPHIN) 2 g in sodium chloride 0.9 % 100 mL IVPB (0 g Intravenous Stopped 06/13/22 1824)  azithromycin (ZITHROMAX) 500 mg in sodium chloride 0.9 % 250 mL IVPB (0 mg Intravenous Stopped 06/13/22 2010)  0.9 %  sodium chloride infusion ( Intravenous New Bag/Given 06/13/22 2059)  atorvastatin (LIPITOR) tablet 40 mg (40 mg Oral Given 06/13/22 2247)  acetaminophen (TYLENOL) tablet 650 mg (has no administration in time range)    Or  acetaminophen (TYLENOL) suppository 650 mg (has no administration in time range)  ondansetron (ZOFRAN) tablet 4 mg (has no administration in time range)    Or  ondansetron (ZOFRAN) injection 4 mg (has no administration in time range)  enoxaparin (LOVENOX) injection 40 mg (40 mg Subcutaneous Given 06/13/22  2247)  hydrALAZINE (APRESOLINE) injection 5 mg (has no administration in time range)  insulin aspart (novoLOG) injection 0-9 Units (has no administration in time range)  insulin aspart (novoLOG) injection 0-5 Units ( Subcutaneous Not Given 06/13/22 2246)  ipratropium-albuterol (DUONEB) 0.5-2.5 (3) MG/3ML nebulizer solution 3 mL (has no administration in time range)  ketorolac (TORADOL) 15 MG/ML injection 15 mg (has no administration in time range)  aspirin EC tablet 81 mg (has no administration in time range)  anastrozole (ARIMIDEX) tablet 1 mg (1 mg Oral Given 06/13/22 2116)  carvedilol (COREG) tablet 12.5 mg (has no administration in time range)  NIFEdipine (PROCARDIA-XL/NIFEDICAL-XL) 24 hr tablet 30 mg (has no administration in time range)  loperamide (IMODIUM) capsule 2 mg (has no administration in time range)  metroNIDAZOLE (METROGEL) 0.75 % vaginal gel 1 Applicatorful (1 Applicatorful Vaginal Given 06/13/22 2259)  gabapentin (NEURONTIN) capsule 100 mg (100 mg Oral Given 06/13/22 2247)  mometasone-formoterol (DULERA) 200-5 MCG/ACT inhaler 2 puff (2 puffs Inhalation Given  06/13/22 2050)  montelukast (SINGULAIR) tablet 10 mg (10 mg Oral Given 06/13/22 2247)  morphine (PF) 2 MG/ML injection 2 mg (has no administration in time range)  sacubitril-valsartan (ENTRESTO) 24-26 mg per tablet (has no administration in time range)  benzonatate (TESSALON) capsule 100 mg (has no administration in time range)  fluconazole (DIFLUCAN) NICU/PED ORAL syringe 10 mg/mL (100 mg Oral Given 06/13/22 2247)  sodium chloride 0.9 % bolus 1,000 mL (0 mLs Intravenous Stopped 06/13/22 2010)    And  sodium chloride 0.9 % bolus 500 mL (0 mLs Intravenous Stopped 06/13/22 2010)  potassium chloride SA (KLOR-CON M) CR tablet 30 mEq (30 mEq Oral Given 06/13/22 2115)  magnesium sulfate IVPB 2 g 50 mL (0 g Intravenous Stopped 06/13/22 2157)   IMPRESSION / MDM / ASSESSMENT AND PLAN / ED COURSE  I reviewed the triage vital signs and the nursing notes.                             The patient is on the cardiac monitor to evaluate for evidence of arrhythmia and/or significant heart rate changes. Patient's presentation is most consistent with acute presentation with potential threat to life or bodily function. Presents with shortness of breath, cough, and malaise concerning for pneumonia.  DDx: PE, COPD exacerbation, Pneumothorax, TB, Atypical ACS, Esophageal Rupture, Toxic Exposure, Foreign Body Airway Obstruction.  Workup: CXR CBC, CMP, lactate, troponin  Given History, Exam, and Workup presentation most consistent with pneumonia.  Findings: Chest x-ray showing bilateral lower lobe opacities concerning for possible pneumonia versus atelectasis  Tx: Ceftriaxone 1g IV Azithromycin 500mg  IV  Disposition: Admit   FINAL CLINICAL IMPRESSION(S) / ED DIAGNOSES   Final diagnoses:  Pneumonia of both lungs due to infectious organism, unspecified part of lung  Sepsis without acute organ dysfunction, due to unspecified organism [facility])   Rx / DC Orders   ED Discharge Orders     None      Note:  This  document was prepared using Dragon voice recognition software and may include unintentional dictation errors.   Merwyn Katos, MD 06/13/22 [phone]

## 2022-06-13 NOTE — Assessment & Plan Note (Signed)
2 g magnesium IV one-time dose ordered Recheck magnesium level in the a.m.

## 2022-06-13 NOTE — Sepsis Progress Note (Signed)
Notified bedside nurse of need to administer antibiotics and fluid bolus.  

## 2022-06-13 NOTE — ED Notes (Signed)
Patient hooked up to heart monitor. Call bell at bedside

## 2022-06-13 NOTE — ED Notes (Signed)
Patient stood out of wheelchair and reported she was about to "fall out" multiple times. Patient asked to stay in wheelchair and not attempt to walk.   Patient then asked to use restroom. Patient assisted to restroom by this RN.  Patient given new blanket and box of tissues

## 2022-06-13 NOTE — ED Triage Notes (Signed)
Pt via EMS from home. Pt c/o L sided chest pain, sharp in nature that started 3:00am. States en route it radiated to her back. Pt also c/o bilateral ankle swelling. EMS reports hypertrophy on on EKG. EMS gave 324 ASA. 20G L AC, EMS gave . Most recent BP was 112/52. Pt is A&OX4 and NAD

## 2022-06-13 NOTE — ED Notes (Signed)
Patient placed in wheelchair near front desk

## 2022-06-13 NOTE — Assessment & Plan Note (Signed)
Insulin-dependent diabetes mellitus Patient takes Lantus 56 units. Insulin SSI with at bedtime coverage ordered

## 2022-06-13 NOTE — Sepsis Progress Note (Signed)
Code Sepsis protocol being monitored by eLink. 

## 2022-06-13 NOTE — Assessment & Plan Note (Signed)
Resumed long-acting inhaler DuoNebs as needed for shortness of breath and wheezing ordered

## 2022-06-13 NOTE — Consult Note (Signed)
CODE SEPSIS - PHARMACY COMMUNICATION  **Broad Spectrum Antibiotics should be administered within 1 hour of Sepsis diagnosis**  Time Code Sepsis Called/Page Received: 1633  Antibiotics Ordered: ceftriaxone, azithromycin  Time of 1st antibiotic administration: 1744, reason for delay unclear  Additional action taken by pharmacy: N/A  Celene Squibb, PharmD PGY1 Pharmacy Resident 06/13/2022 4:37 PM

## 2022-06-13 NOTE — ED Notes (Signed)
Patient assisted to restroom and then back in stretcher

## 2022-06-13 NOTE — Assessment & Plan Note (Signed)
Continue ceftriaxone, azithromycin Maintain MAP greater than 65, will follow second lactic acid Admit to telemetry cardiac, inpatient

## 2022-06-14 ENCOUNTER — Inpatient Hospital Stay: Payer: 59

## 2022-06-14 DIAGNOSIS — J690 Pneumonitis due to inhalation of food and vomit: Secondary | ICD-10-CM | POA: Diagnosis not present

## 2022-06-14 LAB — RESPIRATORY PANEL BY PCR

## 2022-06-14 LAB — CBG MONITORING, ED
Glucose-Capillary: 202 mg/dL — ABNORMAL HIGH (ref 70–99)
Glucose-Capillary: 225 mg/dL — ABNORMAL HIGH (ref 70–99)

## 2022-06-14 LAB — GLUCOSE, CAPILLARY
Glucose-Capillary: 181 mg/dL — ABNORMAL HIGH (ref 70–99)
Glucose-Capillary: 135 mg/dL — ABNORMAL HIGH (ref 70–99)
Glucose-Capillary: 296 mg/dL — ABNORMAL HIGH (ref 70–99)

## 2022-06-14 LAB — BASIC METABOLIC PANEL
Anion gap: 9 (ref 5–15)
BUN: 18 mg/dL (ref 8–23)
CO2: 23 mmol/L (ref 22–32)
Calcium: 8.8 mg/dL — ABNORMAL LOW (ref 8.9–10.3)
Chloride: 107 mmol/L (ref 98–111)
Creatinine, Ser: 1.15 mg/dL — ABNORMAL HIGH (ref 0.44–1.00)
GFR, Estimated: 52 mL/min — ABNORMAL LOW (ref >60)
Glucose, Bld: 217 mg/dL — ABNORMAL HIGH (ref 70–99)
Potassium: 3.4 mmol/L — ABNORMAL LOW (ref 3.5–5.1)
Sodium: 139 mmol/L (ref 135–145)

## 2022-06-14 LAB — CULTURE, BLOOD (ROUTINE X 2)

## 2022-06-14 LAB — CBC
HCT: 35.5 % — ABNORMAL LOW (ref 36.0–46.0)
Hemoglobin: 11.4 g/dL — ABNORMAL LOW (ref 12.0–15.0)
MCH: 29.5 pg (ref 26.0–34.0)
MCHC: 32.1 g/dL (ref 30.0–36.0)
MCV: 92 fL (ref 80.0–100.0)
Platelets: 349 10*3/uL (ref 150–400)
RBC: 3.86 MIL/uL — ABNORMAL LOW (ref 3.87–5.11)
RDW: 13 % (ref 11.5–15.5)
WBC: 17 10*3/uL — ABNORMAL HIGH (ref 4.0–10.5)
nRBC: 0 % (ref 0.0–0.2)

## 2022-06-14 LAB — MAGNESIUM: Magnesium: 1.9 mg/dL (ref 1.7–2.4)

## 2022-06-14 LAB — HEMOGLOBIN A1C
Hgb A1c MFr Bld: 8.8 % — ABNORMAL HIGH (ref 4.8–5.6)
Mean Plasma Glucose: 205.86 mg/dL

## 2022-06-14 MED ORDER — IOHEXOL 350 MG/ML SOLN
75.0000 mL | Freq: Once | INTRAVENOUS | Status: AC | PRN
  Administered 2022-06-14: 75 mL via INTRAVENOUS

## 2022-06-14 MED ORDER — FLUCONAZOLE 40 MG/ML PO SUSR
100.0000 mg | ORAL | Status: DC
  Administered 2022-06-14: 100 mg via ORAL
  Filled 2022-06-14 (×2): qty 2.5

## 2022-06-14 MED ORDER — OXYCODONE HCL 5 MG PO TABS
5.0000 mg | ORAL_TABLET | ORAL | Status: DC | PRN
  Administered 2022-06-14: 10 mg via ORAL
  Filled 2022-06-14: qty 2

## 2022-06-14 MED ORDER — PREDNISONE 20 MG PO TABS
20.0000 mg | ORAL_TABLET | Freq: Every day | ORAL | Status: DC
  Administered 2022-06-14 – 2022-06-15 (×2): 20 mg via ORAL
  Filled 2022-06-14 (×2): qty 1

## 2022-06-14 MED ORDER — KETOROLAC TROMETHAMINE 15 MG/ML IJ SOLN
15.0000 mg | Freq: Four times a day (QID) | INTRAMUSCULAR | Status: DC
  Administered 2022-06-14 – 2022-06-15 (×5): 15 mg via INTRAVENOUS
  Filled 2022-06-14 (×5): qty 1

## 2022-06-14 MED ORDER — IPRATROPIUM-ALBUTEROL 0.5-2.5 (3) MG/3ML IN SOLN
3.0000 mL | Freq: Four times a day (QID) | RESPIRATORY_TRACT | Status: DC
  Administered 2022-06-14 – 2022-06-15 (×5): 3 mL via RESPIRATORY_TRACT
  Filled 2022-06-14 (×5): qty 3

## 2022-06-14 NOTE — Progress Notes (Signed)
PROGRESS NOTE    Gail Stevenson  WJX:[id number] DOB: 03-02-1955 DOA: 06/13/2022 PCP: Al Corpus, MD    Brief Narrative:  67 year old female with hypertension, hyperlipidemia, CAD, insulin-dependent diabetes mellitus, morbid obesity, history of bilateral breast cancer status postmastectomy on the right and lumpectomy on the left, who presents emergency department for chief concerns of chest pain.   Exact etiology is unclear.  Because she continues to endorse chest pain.  Was empirically treated for pneumonia.  Chest CT with contrast ruled out PE and does not have definitive evidence of pneumonia.   Assessment & Plan:   Principal Problem:   Aspiration pneumonia (HCC) Active Problems:   Type 2 diabetes mellitus without complication (HCC)   Hypertension associated with diabetes (HCC)   Morbid obesity (HCC)   Asthma, moderate persistent   Clinical depression   Chronic hepatitis C virus infection (HCC)   S/P small bowel resection   Hyperlipidemia associated with type 2 diabetes mellitus (HCC)   Hypokalemia   Hypomagnesemia  Chest pain atypical Shortness of breath Acute exacerbation of asthma Exact etiology is unclear.  PE has been ruled out.  No obvious source of infection.  Possibly related underlying asthma.  Patient does have some mild end expiratory wheeze.  She is on room air however. Plan: Continue antibiotics for today Can likely discontinue tomorrow if patient remains afebrile Will treat as decompensated asthma with short course of low-dose steroid and bronchodilators  Severe hypomagnesemia Hypokalemia Both noted on admission.  Aggressive replacement.  Recheck labs in AM.  Hyperlipidemia associated with type 2 diabetes mellitus (HCC) Atorvastatin 40 mg nightly resumed   Morbid obesity (HCC) This meets criteria for morbid obesity based on the presence of 1 or more chronic comorbidities. Patient has BMI of 34.24 and hypertension, IDDM2. This complicates overall  care and prognosis.    Hypertension associated with diabetes (HCC) Nifedipine 30 mg daily, Entresto 24-26 p.o. twice daily, carvedilol 12.5 mg were resumed 06/14/2022   Type 2 diabetes mellitus without complication (HCC) Insulin-dependent diabetes mellitus Patient takes Lantus 56 units. Insulin SSI with at bedtime coverage ordered   DVT prophylaxis: SQ Lovenox Code Status: Full Family Communication: None Disposition Plan: Status is: Inpatient Remains inpatient appropriate because: Shortness of breath chest pain of unclear etiology.  Further workup in progress.   Level of care: Telemetry Cardiac  Consultants:  None  Procedures:  None  Antimicrobials: Ceftriaxone Azithromycin   Subjective: Seen and examined.  Continues to endorse intermittent midsternal chest pain.  Objective: Vitals:   06/14/22 0815 06/14/22 0930 06/14/22 1400 06/14/22 1425  BP:  (!) 171/75 132/61 (!) 166/70  Pulse: 86 95 80 82  Resp: 15 19 19 18   Temp:  98.2 F (36.8 C)  98.2 F (36.8 C)  TempSrc:  Oral    SpO2: 100% 100% 100% 100%  Weight:      Height:        Intake/Output Summary (Last 24 hours) at 06/14/2022 1502 Last data filed at 06/14/2022 1400 Gross per 24 hour  Intake 101.53 ml  Output --  Net 101.53 ml   Filed Weights   06/13/22 1439  Weight: 87 kg    Examination:  General exam: Appears calm and comfortable  Respiratory system: Mild end expiratory wheeze.  Normal work of breathing.  Room air Cardiovascular system: S1-S2, RRR, no murmurs, no pedal edema Gastrointestinal system: Abdomen is nondistended, soft and nontender. No organomegaly or masses felt. Normal bowel sounds heard. Central nervous system: Alert and oriented. No focal neurological  deficits. Extremities: Symmetric 5 x 5 power. Skin: No rashes, lesions or ulcers Psychiatry: Judgement and insight appear normal. Mood & affect appropriate.     Data Reviewed: I have personally reviewed following labs and imaging  studies  CBC: Recent Labs  Lab 06/13/22 1440 06/14/22 0448  WBC 14.1* 17.0*  HGB 11.6* 11.4*  HCT 35.4* 35.5*  MCV 89.8 92.0  PLT 323 349   Basic Metabolic Panel: Recent Labs  Lab 06/13/22 1440 06/13/22 1633 06/13/22 1759 06/14/22 0448  NA 138 138  --  139  K 3.4* 3.3*  --  3.4*  CL 105 106  --  107  CO2 25 24  --  23  GLUCOSE 199* 191*  --  217*  BUN 22 22  --  18  CREATININE 1.42* 1.35*  --  1.15*  CALCIUM 8.9 8.9  --  8.8*  MG  --   --  0.8* 1.9   GFR: Estimated Creatinine Clearance: 47.6 mL/min (A) (by C-G formula based on SCr of 1.15 mg/dL (H)). Liver Function Tests: Recent Labs  Lab 06/13/22 1633  AST 21  ALT 17  ALKPHOS 78  BILITOT 0.6  PROT 7.3  ALBUMIN 3.4*   No results for input(s): "LIPASE", "AMYLASE" in the last 168 hours. No results for input(s): "AMMONIA" in the last 168 hours. Coagulation Profile: Recent Labs  Lab 06/13/22 1438  INR 1.3*   Cardiac Enzymes: No results for input(s): "CKTOTAL", "CKMB", "CKMBINDEX", "TROPONINI" in the last 168 hours. BNP (last 3 results) No results for input(s): "PROBNP" in the last 8760 hours. HbA1C: Recent Labs    06/14/22 0448  HGBA1C 8.8*   CBG: Recent Labs  Lab 06/13/22 2039 06/13/22 2245 06/14/22 0853 06/14/22 1114 06/14/22 1429  GLUCAP 191* 169* 202* 225* 181*   Lipid Profile: No results for input(s): "CHOL", "HDL", "LDLCALC", "TRIG", "CHOLHDL", "LDLDIRECT" in the last 72 hours. Thyroid Function Tests: No results for input(s): "TSH", "T4TOTAL", "FREET4", "T3FREE", "THYROIDAB" in the last 72 hours. Anemia Panel: No results for input(s): "VITAMINB12", "FOLATE", "FERRITIN", "TIBC", "IRON", "RETICCTPCT" in the last 72 hours. Sepsis Labs: Recent Labs  Lab 06/13/22 1633 06/13/22 1759 06/13/22 1838  PROCALCITON  --  <0.10  --   LATICACIDVEN 1.7  --  1.1    Recent Results (from the past 240 hour(s))  Blood Culture (routine x 2)     Status: None (Preliminary result)   Collection Time:  06/13/22  4:49 PM   Specimen: BLOOD  Result Value Ref Range Status   Specimen Description BLOOD BLOOD LEFT HAND  Final   Special Requests   Final    BOTTLES DRAWN AEROBIC AND ANAEROBIC Blood Culture results may not be optimal due to an excessive volume of blood received in culture bottles   Culture   Final    NO GROWTH < 24 HOURS Performed at [facility], [address], [place], [place] [address]    Report Status PENDING  Incomplete  Blood Culture (routine x 2)     Status: None (Preliminary result)   Collection Time: 06/13/22  4:52 PM   Specimen: BLOOD  Result Value Ref Range Status   Specimen Description BLOOD BLOOD LEFT ARM  Final   Special Requests   Final    BOTTLES DRAWN AEROBIC AND ANAEROBIC Blood Culture adequate volume   Culture   Final    NO GROWTH < 24 HOURS Performed at [facility], [address], [place], [place] [address]    Report Status PENDING  Incomplete  Resp panel by RT-PCR (RSV, Flu A&B, Covid) Anterior Nasal Swab     Status: None   Collection Time: 06/13/22  5:59 PM   Specimen: Anterior Nasal Swab  Result Value Ref Range Status   SARS Coronavirus 2 by RT PCR NEGATIVE NEGATIVE Final    Comment: (NOTE) SARS-CoV-2 target nucleic acids are NOT DETECTED.  The SARS-CoV-2 RNA is generally detectable in upper respiratory specimens during the acute phase of infection. The lowest concentration of SARS-CoV-2 viral copies this assay can detect is 138 copies/mL. A negative result does not preclude SARS-Cov-2 infection and should not be used as the sole basis for treatment or other patient management decisions. A negative result may occur with  improper specimen collection/handling, submission of specimen other than nasopharyngeal swab, presence of viral mutation(s) within the areas targeted by this assay, and inadequate number of viral copies(<138 copies/mL). A negative result must be combined with clinical observations, patient history, and  epidemiological information. The expected result is Negative.  Fact Sheet for Patients:  BloggerCourse.com  Fact Sheet for Healthcare Providers:  SeriousBroker.it  This test is no t yet approved or cleared by the [place] FDA and  has been authorized for detection and/or diagnosis of SARS-CoV-2 by FDA under an Emergency Use Authorization (EUA). This EUA will remain  in effect (meaning this test can be used) for the duration of the COVID-19 declaration under Section 564(b)(1) of the Act, 21 U.S.C.section 360bbb-3(b)(1), unless the authorization is terminated  or revoked sooner.       Influenza A by PCR NEGATIVE NEGATIVE Final   Influenza B by PCR NEGATIVE NEGATIVE Final    Comment: (NOTE) The Xpert Xpress SARS-CoV-2/FLU/RSV plus assay is intended as an aid in the diagnosis of influenza from Nasopharyngeal swab specimens and should not be used as a sole basis for treatment. Nasal washings and aspirates are unacceptable for Xpert Xpress SARS-CoV-2/FLU/RSV testing.  Fact Sheet for Patients: BloggerCourse.com  Fact Sheet for Healthcare Providers: SeriousBroker.it  This test is not yet approved or cleared by the [place] FDA and has been authorized for detection and/or diagnosis of SARS-CoV-2 by FDA under an Emergency Use Authorization (EUA). This EUA will remain in effect (meaning this test can be used) for the duration of the COVID-19 declaration under Section 564(b)(1) of the Act, 21 U.S.C. section 360bbb-3(b)(1), unless the authorization is terminated or revoked.     Resp Syncytial Virus by PCR NEGATIVE NEGATIVE Final    Comment: (NOTE) Fact Sheet for Patients: BloggerCourse.com  Fact Sheet for Healthcare Providers: SeriousBroker.it  This test is not yet approved or cleared by the [place] FDA and has been  authorized for detection and/or diagnosis of SARS-CoV-2 by FDA under an Emergency Use Authorization (EUA). This EUA will remain in effect (meaning this test can be used) for the duration of the COVID-19 declaration under Section 564(b)(1) of the Act, 21 U.S.C. section 360bbb-3(b)(1), unless the authorization is terminated or revoked.  Performed at [facility], [address] [place], [place] [address]          Radiology Studies: CT Angio Chest Pulmonary Embolism (PE) W or WO Contrast  Result Date: 06/14/2022 CLINICAL DATA:  Pulmonary embolism (PE) suspected, high prob EXAM: CT ANGIOGRAPHY CHEST WITH CONTRAST TECHNIQUE: Multidetector CT imaging of the chest was performed using the standard protocol during bolus administration of intravenous contrast. Multiplanar CT image reconstructions and MIPs were obtained to evaluate the vascular anatomy. RADIATION DOSE REDUCTION: This exam was performed according  to the departmental dose-optimization program which includes automated exposure control, adjustment of the mA and/or kV according to patient size and/or use of iterative reconstruction technique. CONTRAST:  75mL OMNIPAQUE IOHEXOL 350 MG/ML SOLN COMPARISON:  September 2023 FINDINGS: Cardiovascular: Satisfactory opacification of the pulmonary arteries to the segmental level. No evidence of pulmonary embolism. The thoracic aorta is normal in caliber. Normal heart size. No pericardial effusion. Mediastinum/Nodes: No enlarged mediastinal, hilar, or axillary lymph nodes. The thyroid gland appears normal. Lungs/Pleura: No pleural effusion. No pneumothorax. There is bronchial wall thickening with scattered areas of mosaic attenuation more pronounced in the lower lungs. No lobar consolidation. No suspicious pulmonary nodules. Musculoskeletal: No aggressive osseous lesions. Upper abdomen: The visualized upper abdomen is unremarkable. Review of the MIP images confirms the above findings. IMPRESSION:  Vascular: 1. No findings of pulmonary embolism. Nonvascular: 1. There is bronchial wall thickening with scattered areas of mosaic attenuation. Findings are nonspecific but can be seen with viral illness or reactive airways disease. Electronically Signed   By: Olive Bass M.D.   On: 06/14/2022 08:53   DG Chest 2 View  Result Date: 06/13/2022 CLINICAL DATA:  Left-sided chest pain and bilateral ankle swelling EXAM: CHEST - 2 VIEW COMPARISON:  Chest radiograph dated 10/20/2021 FINDINGS: Normal lung volumes. Bibasilar patchy opacities. No pleural effusion or pneumothorax. The heart size and mediastinal contours are within normal limits. No acute osseous abnormality. Surgical clips. IMPRESSION: Bibasilar patchy opacities, which may reflect atelectasis, aspiration, or pneumonia. Electronically Signed   By: Agustin Cree M.D.   On: 06/13/2022 15:07        Scheduled Meds:  anastrozole  1 mg Oral Daily   aspirin EC  81 mg Oral Daily   atorvastatin  40 mg Oral QHS   carvedilol  12.5 mg Oral BID   enoxaparin (LOVENOX) injection  40 mg Subcutaneous Q24H   fluconazole  100 mg Oral Q24H   gabapentin  100 mg Oral BID   insulin aspart  0-5 Units Subcutaneous QHS   insulin aspart  0-9 Units Subcutaneous TID WC   ipratropium-albuterol  3 mL Nebulization Q6H   ketorolac  15 mg Intravenous Q6H   metroNIDAZOLE  1 Applicatorful Vaginal QHS   mometasone-formoterol  2 puff Inhalation BID   montelukast  10 mg Oral QHS   NIFEdipine  30 mg Oral Daily   sacubitril-valsartan  1 tablet Oral BID   Continuous Infusions:  azithromycin Stopped (06/13/22 1753)   cefTRIAXone (ROCEPHIN)  IV Stopped (06/13/22 1822)     LOS: 1 day      Tresa Moore, MD [facility]   If 7PM-7AM, please contact night-coverage  06/14/2022, 3:02 PM

## 2022-06-15 DIAGNOSIS — J45901 Unspecified asthma with (acute) exacerbation: Secondary | ICD-10-CM | POA: Diagnosis not present

## 2022-06-15 LAB — CULTURE, BLOOD (ROUTINE X 2): Culture: NO GROWTH

## 2022-06-15 LAB — GLUCOSE, CAPILLARY
Glucose-Capillary: 232 mg/dL — ABNORMAL HIGH (ref 70–99)
Glucose-Capillary: 277 mg/dL — ABNORMAL HIGH (ref 70–99)

## 2022-06-15 MED ORDER — PHENYLEPHRINE-MINERAL OIL-PET 0.25-14-74.9 % RE OINT: 1.0000 | TOPICAL_OINTMENT | Freq: Two times a day (BID) | RECTAL | Status: DC | PRN

## 2022-06-15 MED ORDER — PREDNISONE 20 MG PO TABS: 20.0000 mg | ORAL_TABLET | Freq: Every day | ORAL | 0 refills | Status: AC

## 2022-06-15 MED ORDER — INSULIN GLARGINE-YFGN 100 UNIT/ML ~~LOC~~ SOLN
15.0000 [IU] | Freq: Every day | SUBCUTANEOUS | Status: DC
  Administered 2022-06-15: 15 [IU] via SUBCUTANEOUS
  Filled 2022-06-15: qty 0.15

## 2022-06-15 MED ORDER — BENZONATATE 100 MG PO CAPS: 200.0000 mg | ORAL_CAPSULE | Freq: Three times a day (TID) | ORAL | 0 refills | Status: DC | PRN

## 2022-06-15 NOTE — Discharge Summary (Signed)
Physician Discharge Summary  Gail Stevenson NWG:[id number] DOB: [birth year] DOA: 06/13/2022  PCP: Al Corpus, MD  Admit date: 06/13/2022 Discharge date: 06/15/2022  Admitted From: Home Disposition:  Home  Recommendations for Outpatient Follow-up:  Follow up with PCP in 1-2 weeks   Home Health:No Equipment/Devices:none   Discharge Condition:Stable  CODE STATUS:FULL Diet recommendation: Reg  Brief/Interim Summary: 67 year old female with hypertension, hyperlipidemia, CAD, insulin-dependent diabetes mellitus, morbid obesity, history of bilateral breast cancer status postmastectomy on the right and lumpectomy on the left, who presents emergency department for chief concerns of chest pain.    Exact etiology is unclear.  Because she continues to endorse chest pain.  Was empirically treated for pneumonia.  Chest CT with contrast ruled out PE and does not have definitive evidence of pneumonia.  On day of discharge chest pain is resolved.  Patient is still coughing.  CT reviewed.  No evidence of pulmonary embolism.  No consolidation to suggest pneumonia.  Respiratory viral panel negative.  Presumptive asthma exacerbation.  At time of discharge will recommend short course of p.o. prednisone, resumption of home nebulizer regimen, Tessalon Perles as needed.  Stable for DC home at this time.  Follow-up outpatient PCP.    Discharge Diagnoses:  Principal Problem:   Aspiration pneumonia (HCC) Active Problems:   Type 2 diabetes mellitus without complication (HCC)   Hypertension associated with diabetes (HCC)   Morbid obesity (HCC)   Asthma, moderate persistent   Clinical depression   Chronic hepatitis C virus infection (HCC)   S/P small bowel resection   Hyperlipidemia associated with type 2 diabetes mellitus (HCC)   Hypokalemia   Hypomagnesemia  Chest pain atypical Shortness of breath Acute exacerbation of asthma Exact etiology is unclear.  PE has been ruled out.  No obvious source  of infection.  Possibly related underlying asthma.  Patient does have some mild end expiratory wheeze.  She is on room air however.  Respiratory viral panel negative.  COVID/flu/RSV negative. Plan: Discharged home.  Discontinue antibiotics.  Short course p.o. prednisone prescribed.  Resume home bronchodilator regimen.  As needed [employer].  Follow-up outpatient PCP.  Discharge Instructions  Discharge Instructions     Diet - low sodium heart healthy   Complete by: As directed    Increase activity slowly   Complete by: As directed       Allergies as of 06/15/2022       Reactions   Metformin Other (See Comments), Swelling   Unable to walk   Patanol [olopatadine]    Unknown reaction    Paroxetine Hcl Rash        Medication List     STOP taking these medications    ergocalciferol 1.25 MG (50000 UT) capsule Commonly known as: VITAMIN D2   ibuprofen 600 MG tablet Commonly known as: ADVIL   silver sulfADIAZINE 1 % cream Commonly known as: Silvadene       TAKE these medications    acetaminophen 160 MG/5ML liquid Commonly known as: TYLENOL Take 500 mg by mouth every 4 (four) hours as needed for fever.   acetaminophen 500 MG tablet Commonly known as: TYLENOL Take 2 tablets (1,000 mg total) by mouth every 6 (six) hours as needed for mild pain.   anastrozole 1 MG tablet Commonly known as: ARIMIDEX TAKE 1 TABLET BY MOUTH EVERY DAY   aspirin EC 81 MG tablet Take 81 mg by mouth daily.   atorvastatin 40 MG tablet Commonly known as: LIPITOR Take 40 mg by mouth at  bedtime.   benzonatate 100 MG capsule Commonly known as: TESSALON Take 2 capsules (200 mg total) by mouth 3 (three) times daily as needed for cough.   carvedilol 12.5 MG tablet Commonly known as: COREG Take 12.5 mg by mouth 2 (two) times daily.   clotrimazole 1 % cream Commonly known as: Lotrimin AF Apply 1 Application topically 2 (two) times daily.   empagliflozin 10 MG Tabs tablet Commonly  known as: JARDIANCE Take 10 mg by mouth daily.   Entresto 24-26 MG Generic drug: sacubitril-valsartan Take 1 tablet by mouth 2 (two) times daily.   fluconazole 40 MG/ML suspension Commonly known as: DIFLUCAN Take 100 mg by mouth daily.   fluticasone-salmeterol 250-50 MCG/ACT Aepb Commonly known as: ADVAIR Inhale 1 puff into the lungs in the morning and at bedtime.   gabapentin 100 MG capsule Commonly known as: NEURONTIN Take 100 mg by mouth 2 (two) times daily.   Lantus SoloStar 100 UNIT/ML Solostar Pen Generic drug: insulin glargine Inject 56 Units into the skin at bedtime.   levalbuterol 45 MCG/ACT inhaler Commonly known as: XOPENEX HFA Inhale 2 puffs into the lungs every 4 (four) hours as needed for wheezing.   loperamide 2 MG capsule Commonly known as: IMODIUM Take 2 mg by mouth 4 (four) times daily as needed for diarrhea or loose stools.   magnesium oxide 400 MG tablet Commonly known as: MAG-OX Take 800 mg by mouth 2 (two) times daily.   metroNIDAZOLE 0.75 % vaginal gel Commonly known as: METROGEL Place 1 Applicatorful vaginally at bedtime.   metroNIDAZOLE 500 MG tablet Commonly known as: FLAGYL Take 500 mg by mouth 2 (two) times daily.   montelukast 10 MG tablet Commonly known as: SINGULAIR Take 10 mg by mouth at bedtime.   multivitamin with minerals Tabs tablet Take 4 tablets by mouth daily.   NIFEdipine 30 MG 24 hr tablet Commonly known as: PROCARDIA-XL/NIFEDICAL-XL Take 30 mg by mouth daily.   omeprazole 20 MG capsule Commonly known as: PRILOSEC Take 20 mg by mouth daily.   potassium chloride 20 MEQ packet Commonly known as: KLOR-CON Take 20 mEq by mouth 2 (two) times daily for 5 days.   predniSONE 20 MG tablet Commonly known as: DELTASONE Take 1 tablet (20 mg total) by mouth daily with breakfast for 4 days. Start taking on: Jun 16, 2022   ProAir HFA 108 (90 Base) MCG/ACT inhaler Generic drug: albuterol Inhale 2 puffs into the lungs every 6  (six) hours as needed for wheezing or shortness of breath.   Symbicort 160-4.5 MCG/ACT inhaler Generic drug: budesonide-formoterol Inhale 2 puffs into the lungs daily.        Allergies  Allergen Reactions   Metformin Other (See Comments) and Swelling    Unable to walk   Patanol [Olopatadine]     Unknown reaction    Paroxetine Hcl Rash    Consultations: None   Procedures/Studies: CT Angio Chest Pulmonary Embolism (PE) W or WO Contrast  Result Date: 06/14/2022 CLINICAL DATA:  Pulmonary embolism (PE) suspected, high prob EXAM: CT ANGIOGRAPHY CHEST WITH CONTRAST TECHNIQUE: Multidetector CT imaging of the chest was performed using the standard protocol during bolus administration of intravenous contrast. Multiplanar CT image reconstructions and MIPs were obtained to evaluate the vascular anatomy. RADIATION DOSE REDUCTION: This exam was performed according to the departmental dose-optimization program which includes automated exposure control, adjustment of the mA and/or kV according to patient size and/or use of iterative reconstruction technique. CONTRAST:  75mL OMNIPAQUE IOHEXOL 350 MG/ML SOLN  COMPARISON:  September 2023 FINDINGS: Cardiovascular: Satisfactory opacification of the pulmonary arteries to the segmental level. No evidence of pulmonary embolism. The thoracic aorta is normal in caliber. Normal heart size. No pericardial effusion. Mediastinum/Nodes: No enlarged mediastinal, hilar, or axillary lymph nodes. The thyroid gland appears normal. Lungs/Pleura: No pleural effusion. No pneumothorax. There is bronchial wall thickening with scattered areas of mosaic attenuation more pronounced in the lower lungs. No lobar consolidation. No suspicious pulmonary nodules. Musculoskeletal: No aggressive osseous lesions. Upper abdomen: The visualized upper abdomen is unremarkable. Review of the MIP images confirms the above findings. IMPRESSION: Vascular: 1. No findings of pulmonary embolism.  Nonvascular: 1. There is bronchial wall thickening with scattered areas of mosaic attenuation. Findings are nonspecific but can be seen with viral illness or reactive airways disease. Electronically Signed   By: Olive Bass M.D.   On: 06/14/2022 08:53   DG Chest 2 View  Result Date: 06/13/2022 CLINICAL DATA:  Left-sided chest pain and bilateral ankle swelling EXAM: CHEST - 2 VIEW COMPARISON:  Chest radiograph dated 10/20/2021 FINDINGS: Normal lung volumes. Bibasilar patchy opacities. No pleural effusion or pneumothorax. The heart size and mediastinal contours are within normal limits. No acute osseous abnormality. Surgical clips. IMPRESSION: Bibasilar patchy opacities, which may reflect atelectasis, aspiration, or pneumonia. Electronically Signed   By: Agustin Cree M.D.   On: 06/13/2022 15:07      Subjective: Seen and examined on the day of discharge.  Symptoms improved.  Chest pain resolved.  Cough improved.  Stable for DC home.  Discharge Exam: Vitals:   06/15/22 0751 06/15/22 1118  BP: (!) 143/95 (!) 122/57  Pulse: 78 88  Resp: 16 15  Temp: 97.8 F (36.6 C) 97.6 F (36.4 C)  SpO2: 100% 100%   Vitals:   06/15/22 0442 06/15/22 0739 06/15/22 0751 06/15/22 1118  BP: (!) 119/50  (!) 143/95 (!) 122/57  Pulse: 73  78 88  Resp: 18  16 15   Temp: 97.7 F (36.5 C)  97.8 F (36.6 C) 97.6 F (36.4 C)  TempSrc:      SpO2: 95% 98% 100% 100%  Weight:      Height:        General: Pt is alert, awake, not in acute distress Cardiovascular: RRR, S1/S2 +, no rubs, no gallops Respiratory: CTA bilaterally, no wheezing, no rhonchi Abdominal: Soft, NT, ND, bowel sounds + Extremities: no edema, no cyanosis    The results of significant diagnostics from this hospitalization (including imaging, microbiology, ancillary and laboratory) are listed below for reference.     Microbiology: Recent Results (from the past 240 hour(s))  Blood Culture (routine x 2)     Status: None (Preliminary result)    Collection Time: 06/13/22  4:49 PM   Specimen: BLOOD  Result Value Ref Range Status   Specimen Description BLOOD BLOOD LEFT HAND  Final   Special Requests   Final    BOTTLES DRAWN AEROBIC AND ANAEROBIC Blood Culture results may not be optimal due to an excessive volume of blood received in culture bottles   Culture   Final    NO GROWTH 2 DAYS Performed at [facility], [address] [place], [place] [address]    Report Status PENDING  Incomplete  Blood Culture (routine x 2)     Status: None (Preliminary result)   Collection Time: 06/13/22  4:52 PM   Specimen: BLOOD  Result Value Ref Range Status   Specimen Description BLOOD BLOOD LEFT ARM  Final  Special Requests   Final    BOTTLES DRAWN AEROBIC AND ANAEROBIC Blood Culture adequate volume   Culture   Final    NO GROWTH 2 DAYS Performed at [facility], [address] [place], [place] [address]    Report Status PENDING  Incomplete  Resp panel by RT-PCR (RSV, Flu A&B, Covid) Anterior Nasal Swab     Status: None   Collection Time: 06/13/22  5:59 PM   Specimen: Anterior Nasal Swab  Result Value Ref Range Status   SARS Coronavirus 2 by RT PCR NEGATIVE NEGATIVE Final    Comment: (NOTE) SARS-CoV-2 target nucleic acids are NOT DETECTED.  The SARS-CoV-2 RNA is generally detectable in upper respiratory specimens during the acute phase of infection. The lowest concentration of SARS-CoV-2 viral copies this assay can detect is 138 copies/mL. A negative result does not preclude SARS-Cov-2 infection and should not be used as the sole basis for treatment or other patient management decisions. A negative result may occur with  improper specimen collection/handling, submission of specimen other than nasopharyngeal swab, presence of viral mutation(s) within the areas targeted by this assay, and inadequate number of viral copies(<138 copies/mL). A negative result must be combined with clinical observations, patient  history, and epidemiological information. The expected result is Negative.  Fact Sheet for Patients:  BloggerCourse.com  Fact Sheet for Healthcare Providers:  SeriousBroker.it  This test is no t yet approved or cleared by the [place] FDA and  has been authorized for detection and/or diagnosis of SARS-CoV-2 by FDA under an Emergency Use Authorization (EUA). This EUA will remain  in effect (meaning this test can be used) for the duration of the COVID-19 declaration under Section 564(b)(1) of the Act, 21 U.S.C.section 360bbb-3(b)(1), unless the authorization is terminated  or revoked sooner.       Influenza A by PCR NEGATIVE NEGATIVE Final   Influenza B by PCR NEGATIVE NEGATIVE Final    Comment: (NOTE) The Xpert Xpress SARS-CoV-2/FLU/RSV plus assay is intended as an aid in the diagnosis of influenza from Nasopharyngeal swab specimens and should not be used as a sole basis for treatment. Nasal washings and aspirates are unacceptable for Xpert Xpress SARS-CoV-2/FLU/RSV testing.  Fact Sheet for Patients: BloggerCourse.com  Fact Sheet for Healthcare Providers: SeriousBroker.it  This test is not yet approved or cleared by the [place] FDA and has been authorized for detection and/or diagnosis of SARS-CoV-2 by FDA under an Emergency Use Authorization (EUA). This EUA will remain in effect (meaning this test can be used) for the duration of the COVID-19 declaration under Section 564(b)(1) of the Act, 21 U.S.C. section 360bbb-3(b)(1), unless the authorization is terminated or revoked.     Resp Syncytial Virus by PCR NEGATIVE NEGATIVE Final    Comment: (NOTE) Fact Sheet for Patients: BloggerCourse.com  Fact Sheet for Healthcare Providers: SeriousBroker.it  This test is not yet approved or cleared by the [place] FDA  and has been authorized for detection and/or diagnosis of SARS-CoV-2 by FDA under an Emergency Use Authorization (EUA). This EUA will remain in effect (meaning this test can be used) for the duration of the COVID-19 declaration under Section 564(b)(1) of the Act, 21 U.S.C. section 360bbb-3(b)(1), unless the authorization is terminated or revoked.  Performed at [facility], [address] [place], [place] [address]   Respiratory (~20 pathogens) panel by PCR     Status: None   Collection Time: 06/14/22  9:39 AM   Specimen: Nasopharyngeal Swab; Respiratory  Result Value  Ref Range Status   Adenovirus NOT DETECTED NOT DETECTED Final   Coronavirus 229E NOT DETECTED NOT DETECTED Final    Comment: (NOTE) The Coronavirus on the Respiratory Panel, DOES NOT test for the novel  Coronavirus (2019 nCoV)    Coronavirus HKU1 NOT DETECTED NOT DETECTED Final   Coronavirus NL63 NOT DETECTED NOT DETECTED Final   Coronavirus OC43 NOT DETECTED NOT DETECTED Final   Metapneumovirus NOT DETECTED NOT DETECTED Final   Rhinovirus / Enterovirus NOT DETECTED NOT DETECTED Final   Influenza A NOT DETECTED NOT DETECTED Final   Influenza B NOT DETECTED NOT DETECTED Final   Parainfluenza Virus 1 NOT DETECTED NOT DETECTED Final   Parainfluenza Virus 2 NOT DETECTED NOT DETECTED Final   Parainfluenza Virus 3 NOT DETECTED NOT DETECTED Final   Parainfluenza Virus 4 NOT DETECTED NOT DETECTED Final   Respiratory Syncytial Virus NOT DETECTED NOT DETECTED Final   Bordetella pertussis NOT DETECTED NOT DETECTED Final   Bordetella Parapertussis NOT DETECTED NOT DETECTED Final   Chlamydophila pneumoniae NOT DETECTED NOT DETECTED Final   Mycoplasma pneumoniae NOT DETECTED NOT DETECTED Final    Comment: Performed at [facility], [address] [place], [place] [address]     Labs: BNP (last 3 results) Recent Labs    10/20/21 1324  BNP 38.7   Basic Metabolic Panel: Recent Labs  Lab 06/13/22 1440  06/13/22 1633 06/13/22 1759 06/14/22 0448  NA 138 138  --  139  K 3.4* 3.3*  --  3.4*  CL 105 106  --  107  CO2 25 24  --  23  GLUCOSE 199* 191*  --  217*  BUN 22 22  --  18  CREATININE 1.42* 1.35*  --  1.15*  CALCIUM 8.9 8.9  --  8.8*  MG  --   --  0.8* 1.9   Liver Function Tests: Recent Labs  Lab 06/13/22 1633  AST 21  ALT 17  ALKPHOS 78  BILITOT 0.6  PROT 7.3  ALBUMIN 3.4*   No results for input(s): "LIPASE", "AMYLASE" in the last 168 hours. No results for input(s): "AMMONIA" in the last 168 hours. CBC: Recent Labs  Lab 06/13/22 1440 06/14/22 0448  WBC 14.1* 17.0*  HGB 11.6* 11.4*  HCT 35.4* 35.5*  MCV 89.8 92.0  PLT 323 349   Cardiac Enzymes: No results for input(s): "CKTOTAL", "CKMB", "CKMBINDEX", "TROPONINI" in the last 168 hours. BNP: Invalid input(s): "POCBNP" CBG: Recent Labs  Lab 06/14/22 1429 06/14/22 1730 06/14/22 2101 06/15/22 0754 06/15/22 1118  GLUCAP 181* 135* 296* 232* 277*   D-Dimer No results for input(s): "DDIMER" in the last 72 hours. Hgb A1c Recent Labs    06/14/22 0448  HGBA1C 8.8*   Lipid Profile No results for input(s): "CHOL", "HDL", "LDLCALC", "TRIG", "CHOLHDL", "LDLDIRECT" in the last 72 hours. Thyroid function studies No results for input(s): "TSH", "T4TOTAL", "T3FREE", "THYROIDAB" in the last 72 hours.  Invalid input(s): "FREET3" Anemia work up No results for input(s): "VITAMINB12", "FOLATE", "FERRITIN", "TIBC", "IRON", "RETICCTPCT" in the last 72 hours. Urinalysis    Component Value Date/Time   COLORURINE YELLOW (A) 06/13/2022 1730   APPEARANCEUR CLEAR (A) 06/13/2022 1730   LABSPEC 1.023 06/13/2022 1730   PHURINE 5.0 06/13/2022 1730   GLUCOSEU >=500 (A) 06/13/2022 1730   HGBUR NEGATIVE 06/13/2022 1730   BILIRUBINUR NEGATIVE 06/13/2022 1730   KETONESUR NEGATIVE 06/13/2022 1730   PROTEINUR NEGATIVE 06/13/2022 1730   NITRITE NEGATIVE 06/13/2022 1730   LEUKOCYTESUR NEGATIVE 06/13/2022  1730   Sepsis  Labs Recent Labs  Lab 06/13/22 1440 06/14/22 0448  WBC 14.1* 17.0*   Microbiology Recent Results (from the past 240 hour(s))  Blood Culture (routine x 2)     Status: None (Preliminary result)   Collection Time: 06/13/22  4:49 PM   Specimen: BLOOD  Result Value Ref Range Status   Specimen Description BLOOD BLOOD LEFT HAND  Final   Special Requests   Final    BOTTLES DRAWN AEROBIC AND ANAEROBIC Blood Culture results may not be optimal due to an excessive volume of blood received in culture bottles   Culture   Final    NO GROWTH 2 DAYS Performed at [facility], [address], [place], [place] [address]    Report Status PENDING  Incomplete  Blood Culture (routine x 2)     Status: None (Preliminary result)   Collection Time: 06/13/22  4:52 PM   Specimen: BLOOD  Result Value Ref Range Status   Specimen Description BLOOD BLOOD LEFT ARM  Final   Special Requests   Final    BOTTLES DRAWN AEROBIC AND ANAEROBIC Blood Culture adequate volume   Culture   Final    NO GROWTH 2 DAYS Performed at [facility], [address], [place], [place] [address]    Report Status PENDING  Incomplete  Resp panel by RT-PCR (RSV, Flu A&B, Covid) Anterior Nasal Swab     Status: None   Collection Time: 06/13/22  5:59 PM   Specimen: Anterior Nasal Swab  Result Value Ref Range Status   SARS Coronavirus 2 by RT PCR NEGATIVE NEGATIVE Final    Comment: (NOTE) SARS-CoV-2 target nucleic acids are NOT DETECTED.  The SARS-CoV-2 RNA is generally detectable in upper respiratory specimens during the acute phase of infection. The lowest concentration of SARS-CoV-2 viral copies this assay can detect is 138 copies/mL. A negative result does not preclude SARS-Cov-2 infection and should not be used as the sole basis for treatment or other patient management decisions. A negative result may occur with  improper specimen collection/handling, submission of specimen other than nasopharyngeal swab,  presence of viral mutation(s) within the areas targeted by this assay, and inadequate number of viral copies(<138 copies/mL). A negative result must be combined with clinical observations, patient history, and epidemiological information. The expected result is Negative.  Fact Sheet for Patients:  BloggerCourse.com  Fact Sheet for Healthcare Providers:  SeriousBroker.it  This test is no t yet approved or cleared by the [place] FDA and  has been authorized for detection and/or diagnosis of SARS-CoV-2 by FDA under an Emergency Use Authorization (EUA). This EUA will remain  in effect (meaning this test can be used) for the duration of the COVID-19 declaration under Section 564(b)(1) of the Act, 21 U.S.C.section 360bbb-3(b)(1), unless the authorization is terminated  or revoked sooner.       Influenza A by PCR NEGATIVE NEGATIVE Final   Influenza B by PCR NEGATIVE NEGATIVE Final    Comment: (NOTE) The Xpert Xpress SARS-CoV-2/FLU/RSV plus assay is intended as an aid in the diagnosis of influenza from Nasopharyngeal swab specimens and should not be used as a sole basis for treatment. Nasal washings and aspirates are unacceptable for Xpert Xpress SARS-CoV-2/FLU/RSV testing.  Fact Sheet for Patients: BloggerCourse.com  Fact Sheet for Healthcare Providers: SeriousBroker.it  This test is not yet approved or cleared by the [place] FDA and has been authorized for detection and/or diagnosis of SARS-CoV-2 by FDA under an Emergency Use  Authorization (EUA). This EUA will remain in effect (meaning this test can be used) for the duration of the COVID-19 declaration under Section 564(b)(1) of the Act, 21 U.S.C. section 360bbb-3(b)(1), unless the authorization is terminated or revoked.     Resp Syncytial Virus by PCR NEGATIVE NEGATIVE Final    Comment: (NOTE) Fact Sheet for  Patients: BloggerCourse.com  Fact Sheet for Healthcare Providers: SeriousBroker.it  This test is not yet approved or cleared by the [place] FDA and has been authorized for detection and/or diagnosis of SARS-CoV-2 by FDA under an Emergency Use Authorization (EUA). This EUA will remain in effect (meaning this test can be used) for the duration of the COVID-19 declaration under Section 564(b)(1) of the Act, 21 U.S.C. section 360bbb-3(b)(1), unless the authorization is terminated or revoked.  Performed at [facility] [address] [place], [place] [address]   Respiratory (~20 pathogens) panel by PCR     Status: None   Collection Time: 06/14/22  9:39 AM   Specimen: Nasopharyngeal Swab; Respiratory  Result Value Ref Range Status   Adenovirus NOT DETECTED NOT DETECTED Final   Coronavirus 229E NOT DETECTED NOT DETECTED Final    Comment: (NOTE) The Coronavirus on the Respiratory Panel, DOES NOT test for the novel  Coronavirus (2019 nCoV)    Coronavirus HKU1 NOT DETECTED NOT DETECTED Final   Coronavirus NL63 NOT DETECTED NOT DETECTED Final   Coronavirus OC43 NOT DETECTED NOT DETECTED Final   Metapneumovirus NOT DETECTED NOT DETECTED Final   Rhinovirus / Enterovirus NOT DETECTED NOT DETECTED Final   Influenza A NOT DETECTED NOT DETECTED Final   Influenza B NOT DETECTED NOT DETECTED Final   Parainfluenza Virus 1 NOT DETECTED NOT DETECTED Final   Parainfluenza Virus 2 NOT DETECTED NOT DETECTED Final   Parainfluenza Virus 3 NOT DETECTED NOT DETECTED Final   Parainfluenza Virus 4 NOT DETECTED NOT DETECTED Final   Respiratory Syncytial Virus NOT DETECTED NOT DETECTED Final   Bordetella pertussis NOT DETECTED NOT DETECTED Final   Bordetella Parapertussis NOT DETECTED NOT DETECTED Final   Chlamydophila pneumoniae NOT DETECTED NOT DETECTED Final   Mycoplasma pneumoniae NOT DETECTED NOT DETECTED Final    Comment: Performed at  [facility], [address], [place], [place] [address]     Time coordinating discharge: Over 30 minutes  SIGNED:   Tresa Moore, MD  [facility] 06/15/2022, 12:59 PM Pager   If 7PM-7AM, please contact night-coverage

## 2022-06-15 NOTE — Inpatient Diabetes Management (Signed)
Inpatient Diabetes Program Recommendations  AACE/ADA: New Consensus Statement on Inpatient Glycemic Control (2015)  Target Ranges:  Prepandial:   less than 140 mg/dL      Peak postprandial:   less than 180 mg/dL (1-2 hours)      Critically ill patients:  140 - 180 mg/dL    Latest Reference Range & Units 06/14/22 08:53 06/14/22 11:14 06/14/22 14:29 06/14/22 17:30 06/14/22 21:01  Glucose-Capillary 70 - 99 mg/dL 161 (H)  3 units Novolog  225 (H)  3 units Novolog  181 (H) 135 (H)  1 unit Novolog  296 (H)  3 units Novolog   (H): Data is abnormally high  Latest Reference Range & Units 06/15/22 07:54  Glucose-Capillary 70 - 99 mg/dL 096 (H)  3 units Novolog   (H): Data is abnormally high     Home DM Meds: Jardiance 10 mg daily        Lantus 56 units QHS          Current Orders: Novolog Sensitive Correction Scale/ SSI (0-9 units) TID AC + HS      MD- Not pt getting Prednisone 20 mg daily and takes Lantus at home.  CBG 232 this AM  Please consider starting Semglee 18 units QHS (1/3 total home dose)     --Will follow patient during hospitalization--  Ambrose Finland RN, MSN, CDCES Diabetes Coordinator Inpatient Glycemic Control Team Team Pager: [phone] (8a-5p)

## 2022-06-15 NOTE — Care Management Important Message (Signed)
Important Message  Patient Details  Name: Gail Stevenson MRN: [id number] Date of Birth: October 26, 1955   Medicare Important Message Given:  Yes     Johnell Comings 06/15/2022, 2:09 PM

## 2022-06-16 LAB — CULTURE, BLOOD (ROUTINE X 2): Culture: NO GROWTH

## 2022-06-17 LAB — CULTURE, BLOOD (ROUTINE X 2)

## 2022-06-18 LAB — CULTURE, BLOOD (ROUTINE X 2): Special Requests: ADEQUATE

## 2022-06-19 ENCOUNTER — Ambulatory Visit: Payer: 59 | Admitting: Internal Medicine

## 2022-06-19 ENCOUNTER — Other Ambulatory Visit: Payer: 59

## 2022-07-04 ENCOUNTER — Ambulatory Visit: Payer: Medicaid Other | Admitting: Radiation Oncology

## 2022-08-07 ENCOUNTER — Other Ambulatory Visit: Payer: Self-pay

## 2022-08-07 ENCOUNTER — Telehealth: Payer: Self-pay | Admitting: Student

## 2022-08-07 DIAGNOSIS — D0512 Intraductal carcinoma in situ of left breast: Secondary | ICD-10-CM

## 2022-08-07 NOTE — Telephone Encounter (Signed)
PFT was cancelled byt the hosp

## 2022-08-07 NOTE — Telephone Encounter (Signed)
PT wants to cancel both OV and PFT. I canceled OV. Please cancel PFT at hospital.

## 2022-08-08 ENCOUNTER — Other Ambulatory Visit: Payer: Self-pay

## 2022-08-08 DIAGNOSIS — D0512 Intraductal carcinoma in situ of left breast: Secondary | ICD-10-CM

## 2022-08-22 ENCOUNTER — Ambulatory Visit: Payer: 59 | Admitting: Nurse Practitioner

## 2022-08-23 ENCOUNTER — Ambulatory Visit: Payer: 59

## 2022-09-08 ENCOUNTER — Other Ambulatory Visit: Payer: Self-pay

## 2022-09-08 ENCOUNTER — Emergency Department: Payer: 59

## 2022-09-08 DIAGNOSIS — R079 Chest pain, unspecified: Secondary | ICD-10-CM | POA: Diagnosis present

## 2022-09-08 DIAGNOSIS — E119 Type 2 diabetes mellitus without complications: Secondary | ICD-10-CM | POA: Diagnosis not present

## 2022-09-08 DIAGNOSIS — I10 Essential (primary) hypertension: Secondary | ICD-10-CM | POA: Diagnosis not present

## 2022-09-08 DIAGNOSIS — Z853 Personal history of malignant neoplasm of breast: Secondary | ICD-10-CM | POA: Diagnosis not present

## 2022-09-08 DIAGNOSIS — I2510 Atherosclerotic heart disease of native coronary artery without angina pectoris: Secondary | ICD-10-CM | POA: Diagnosis not present

## 2022-09-08 LAB — TROPONIN I (HIGH SENSITIVITY): Troponin I (High Sensitivity): 8 ng/L (ref <18)

## 2022-09-08 LAB — BASIC METABOLIC PANEL WITH GFR
Anion gap: 8 (ref 5–15)
BUN: 24 mg/dL — ABNORMAL HIGH (ref 8–23)
CO2: 25 mmol/L (ref 22–32)
Calcium: 8.8 mg/dL — ABNORMAL LOW (ref 8.9–10.3)
Chloride: 106 mmol/L (ref 98–111)
Creatinine, Ser: 1.49 mg/dL — ABNORMAL HIGH (ref 0.44–1.00)
GFR, Estimated: 38 mL/min — ABNORMAL LOW (ref >60)
Glucose, Bld: 175 mg/dL — ABNORMAL HIGH (ref 70–99)
Potassium: 3.3 mmol/L — ABNORMAL LOW (ref 3.5–5.1)
Sodium: 139 mmol/L (ref 135–145)

## 2022-09-08 LAB — CBC
HCT: 37.6 % (ref 36.0–46.0)
Hemoglobin: 12.1 g/dL (ref 12.0–15.0)
MCH: 29.8 pg (ref 26.0–34.0)
MCHC: 32.2 g/dL (ref 30.0–36.0)
MCV: 92.6 fL (ref 80.0–100.0)
Platelets: 306 10*3/uL (ref 150–400)
RBC: 4.06 MIL/uL (ref 3.87–5.11)
RDW: 12.9 % (ref 11.5–15.5)
WBC: 11 10*3/uL — ABNORMAL HIGH (ref 4.0–10.5)
nRBC: 0 % (ref 0.0–0.2)

## 2022-09-08 NOTE — ED Triage Notes (Signed)
Pt to ed from home via POV for CP since 9pm. It woke her up from her sleep. Pt is caox4, in no acute distress and ambulatory in triage.

## 2022-09-09 ENCOUNTER — Emergency Department: Payer: 59

## 2022-09-09 ENCOUNTER — Emergency Department
Admission: EM | Admit: 2022-09-09 | Discharge: 2022-09-09 | Disposition: A | Discharge status: Home / Self Care | Payer: 59 | Attending: Emergency Medicine | Admitting: Emergency Medicine

## 2022-09-09 DIAGNOSIS — R079 Chest pain, unspecified: Secondary | ICD-10-CM

## 2022-09-09 LAB — TROPONIN I (HIGH SENSITIVITY): Troponin I (High Sensitivity): 8 ng/L (ref <18)

## 2022-09-09 MED ORDER — IOHEXOL 350 MG/ML SOLN
100.0000 mL | Freq: Once | INTRAVENOUS | Status: AC | PRN
  Administered 2022-09-09: 100 mL via INTRAVENOUS

## 2022-09-09 MED ORDER — KETOROLAC TROMETHAMINE 30 MG/ML IJ SOLN
15.0000 mg | Freq: Once | INTRAMUSCULAR | Status: AC
  Administered 2022-09-09: 15 mg via INTRAVENOUS
  Filled 2022-09-09: qty 1

## 2022-09-09 MED ORDER — MORPHINE SULFATE (PF) 2 MG/ML IV SOLN
2.0000 mg | Freq: Once | INTRAVENOUS | Status: AC
  Administered 2022-09-09: 2 mg via INTRAVENOUS
  Filled 2022-09-09: qty 1

## 2022-09-09 MED ORDER — PREDNISOLONE 15 MG/5ML PO SOLN: 40.0000 mg | Freq: Every day | ORAL | 0 refills | Status: AC

## 2022-09-09 MED ORDER — SODIUM CHLORIDE 0.9 % IV BOLUS
1000.0000 mL | Freq: Once | INTRAVENOUS | Status: AC
  Administered 2022-09-09: 1000 mL via INTRAVENOUS

## 2022-09-09 NOTE — Discharge Instructions (Addendum)
You were seen in the ER today for your chest pain.  Your workup was overall reassuring.  I sent a short steroid course to your pharmacy for possible mild asthma flare.  Return to the ER for new or worsening symptoms.

## 2022-09-09 NOTE — ED Provider Notes (Signed)
[facility] Provider Note    Event Date/Time   First MD Initiated Contact with Patient 09/09/22 [phone]     (approximate)   History   Chest Pain   HPI  Gail Stevenson is a 67 y.o. female with history of hypertension, CAD, DM, bilateral breast cancer presenting to the emergency department for evaluation of chest pain.  Around 9 PM, patient had onset of chest pain described as a sharp sensation initially over the left side of her chest, now more over the right side of her chest.  Says this feels similar to when she had pneumonia a few months ago.  Does report a mild cough.  No fevers.  Does report some associated shortness of breath.      Physical Exam   Triage Vital Signs: ED Triage Vitals  Encounter Vitals Group     BP 09/08/22 2227 (!) 148/75     Systolic BP Percentile --      Diastolic BP Percentile --      Pulse Rate 09/08/22 2227 80     Resp 09/08/22 2227 18     Temp 09/08/22 2227 98 F (36.7 C)     Temp Source 09/08/22 2227 Oral     SpO2 09/08/22 2227 99 %     Weight --      Height 09/08/22 2228 5\' 1"  (1.549 m)     Head Circumference --      Peak Flow --      Pain Score 09/08/22 2228 10     Pain Loc --      Pain Education --      Exclude from Growth Chart --     Most recent vital signs: Vitals:   09/09/22 0200 09/09/22 0300  BP: (!) 141/69 (!) 144/72  Pulse: 71 89  Resp: 18 (!) 21  Temp:    SpO2: 93% 95%     General: Awake, interactive  CV:  Regular rate, good peripheral perfusion.  Chest wall: Readily reproducible tenderness to palpation over the bilateral anterior chest wall Resp:  Lungs clear, unlabored respirations.  Abd:  Soft, nondistended.  Neuro:  Symmetric facial movement, fluid speech   ED Results / Procedures / Treatments   Labs (all labs ordered are listed, but only abnormal results are displayed) Labs Reviewed  BASIC METABOLIC PANEL - Abnormal; Notable for the following components:      Result Value    Potassium 3.3 (*)    Glucose, Bld 175 (*)    BUN 24 (*)    Creatinine, Ser 1.49 (*)    Calcium 8.8 (*)    GFR, Estimated 38 (*)    All other components within normal limits  CBC - Abnormal; Notable for the following components:   WBC 11.0 (*)    All other components within normal limits  TROPONIN I (HIGH SENSITIVITY)  TROPONIN I (HIGH SENSITIVITY)     EKG EKG independently reviewed interpreted by myself (ER attending) demonstrates:  EKG demonstrates normal sinus rhythm at a rate of 75, PR 162, QRS 94, QTc 446, no acute ST changes  RADIOLOGY Imaging independently reviewed and interpreted by myself demonstrates:  CXR without focal consolidation CT of the chest without PE  PROCEDURES:  Critical Care performed: No  Procedures   MEDICATIONS ORDERED IN ED: Medications  sodium chloride 0.9 % bolus 1,000 mL (1,000 mLs Intravenous New Bag/Given 09/09/22 0159)  morphine (PF) 2 MG/ML injection 2 mg (2 mg Intravenous Given 09/09/22 0200)  ketorolac (TORADOL)  30 MG/ML injection 15 mg (15 mg Intravenous Given 09/09/22 0200)  iohexol (OMNIPAQUE) 350 MG/ML injection 100 mL (100 mLs Intravenous Contrast Given 09/09/22 0227)     IMPRESSION / MDM / ASSESSMENT AND PLAN / ED COURSE  I reviewed the triage vital signs and the nursing notes.  Differential diagnosis includes, but is not limited to, pneumonia, pneumothorax, ACS, pulmonary embolism, musculoskeletal pain  Patient's presentation is most consistent with acute presentation with potential threat to life or bodily function.  67 year old female presenting to the emergency department for evaluation of chest pain.  Lab work demonstrates elevated creatinine at 1.49, recent priors 1.1-1.4.  Her initial and repeat troponin are negative.  Stable mild hypokalemia.  Patient recently admitted for chest pain of unclear etiology.  Do question some component of pleurisy and inflammatory musculoskeletal changes given her reproducible tenderness on exam.   She was updated on the results of her workup.  Does have a history of CAD, but clinical history atypical for ACS.  Given reassuring workup, do think she is stable for discharge with outpatient follow-up.  No wheezing on exam here, but does report a history of asthma, will DC with short steroid course.  Will place referral to cardiology.  Strict return precautions provided.  Patient discharged stable condition.      FINAL CLINICAL IMPRESSION(S) / ED DIAGNOSES   Final diagnoses:  Nonspecific chest pain     Rx / DC Orders   ED Discharge Orders          Ordered    Ambulatory referral to Cardiology       Comments: If you have not heard from the Cardiology office within the next 72 hours please call [phone].   09/09/22 0330    prednisoLONE (PRELONE) 15 MG/5ML SOLN  Daily before breakfast        09/09/22 0330             Note:  This document was prepared using Dragon voice recognition software and may include unintentional dictation errors.   Trinna Post, MD 09/09/22 [phone]

## 2022-09-11 ENCOUNTER — Inpatient Hospital Stay: Payer: 59 | Admitting: Internal Medicine

## 2022-09-11 ENCOUNTER — Inpatient Hospital Stay: Payer: 59

## 2022-09-11 ENCOUNTER — Ambulatory Visit: Payer: 59 | Admitting: Radiation Oncology

## 2022-10-03 ENCOUNTER — Inpatient Hospital Stay: Payer: 59

## 2022-10-03 ENCOUNTER — Inpatient Hospital Stay: Payer: 59 | Admitting: Internal Medicine

## 2022-10-05 ENCOUNTER — Inpatient Hospital Stay: Payer: 59 | Attending: Internal Medicine

## 2022-10-05 ENCOUNTER — Inpatient Hospital Stay (HOSPITAL_BASED_OUTPATIENT_CLINIC_OR_DEPARTMENT_OTHER): Payer: 59 | Admitting: Internal Medicine

## 2022-10-05 ENCOUNTER — Encounter: Payer: Self-pay | Admitting: Internal Medicine

## 2022-10-05 VITALS — BP 148/67 | HR 82 | Temp 98.7°F | Ht 61.0 in | Wt 182.2 lb

## 2022-10-05 DIAGNOSIS — Z9221 Personal history of antineoplastic chemotherapy: Secondary | ICD-10-CM | POA: Diagnosis not present

## 2022-10-05 DIAGNOSIS — M25472 Effusion, left ankle: Secondary | ICD-10-CM | POA: Insufficient documentation

## 2022-10-05 DIAGNOSIS — Z923 Personal history of irradiation: Secondary | ICD-10-CM | POA: Diagnosis not present

## 2022-10-05 DIAGNOSIS — M25471 Effusion, right ankle: Secondary | ICD-10-CM | POA: Diagnosis not present

## 2022-10-05 DIAGNOSIS — Z800 Family history of malignant neoplasm of digestive organs: Secondary | ICD-10-CM | POA: Diagnosis not present

## 2022-10-05 DIAGNOSIS — N183 Chronic kidney disease, stage 3 unspecified: Secondary | ICD-10-CM | POA: Insufficient documentation

## 2022-10-05 DIAGNOSIS — D0512 Intraductal carcinoma in situ of left breast: Secondary | ICD-10-CM

## 2022-10-05 DIAGNOSIS — Z9011 Acquired absence of right breast and nipple: Secondary | ICD-10-CM | POA: Diagnosis not present

## 2022-10-05 DIAGNOSIS — Z87891 Personal history of nicotine dependence: Secondary | ICD-10-CM | POA: Insufficient documentation

## 2022-10-05 DIAGNOSIS — Z79811 Long term (current) use of aromatase inhibitors: Secondary | ICD-10-CM | POA: Diagnosis not present

## 2022-10-05 DIAGNOSIS — D75839 Thrombocytosis, unspecified: Secondary | ICD-10-CM | POA: Insufficient documentation

## 2022-10-05 DIAGNOSIS — M8580 Other specified disorders of bone density and structure, unspecified site: Secondary | ICD-10-CM | POA: Insufficient documentation

## 2022-10-05 DIAGNOSIS — R7989 Other specified abnormal findings of blood chemistry: Secondary | ICD-10-CM

## 2022-10-05 DIAGNOSIS — Z803 Family history of malignant neoplasm of breast: Secondary | ICD-10-CM | POA: Insufficient documentation

## 2022-10-05 LAB — CBC WITH DIFFERENTIAL (CANCER CENTER ONLY)
Abs Immature Granulocytes: 0.05 10*3/uL (ref 0.00–0.07)
Basophils Absolute: 0 10*3/uL (ref 0.0–0.1)
Basophils Relative: 0 %
Eosinophils Absolute: 0.1 10*3/uL (ref 0.0–0.5)
Eosinophils Relative: 1 %
HCT: 38.2 % (ref 36.0–46.0)
Hemoglobin: 12.3 g/dL (ref 12.0–15.0)
Immature Granulocytes: 1 %
Lymphocytes Relative: 19 %
Lymphs Abs: 2 10*3/uL (ref 0.7–4.0)
MCH: 29.6 pg (ref 26.0–34.0)
MCHC: 32.2 g/dL (ref 30.0–36.0)
MCV: 92 fL (ref 80.0–100.0)
Monocytes Absolute: 1.1 10*3/uL — ABNORMAL HIGH (ref 0.1–1.0)
Monocytes Relative: 10 %
Neutro Abs: 7.3 10*3/uL (ref 1.7–7.7)
Neutrophils Relative %: 69 %
Platelet Count: 287 10*3/uL (ref 150–400)
RBC: 4.15 MIL/uL (ref 3.87–5.11)
RDW: 12.9 % (ref 11.5–15.5)
WBC Count: 10.7 10*3/uL — ABNORMAL HIGH (ref 4.0–10.5)
nRBC: 0 % (ref 0.0–0.2)

## 2022-10-05 LAB — CMP (CANCER CENTER ONLY)
ALT: 22 U/L (ref 0–44)
AST: 22 U/L (ref 15–41)
Albumin: 3.8 g/dL (ref 3.5–5.0)
Alkaline Phosphatase: 75 U/L (ref 38–126)
Anion gap: 8 (ref 5–15)
BUN: 26 mg/dL — ABNORMAL HIGH (ref 8–23)
CO2: 27 mmol/L (ref 22–32)
Calcium: 9.2 mg/dL (ref 8.9–10.3)
Chloride: 106 mmol/L (ref 98–111)
Creatinine: 1.36 mg/dL — ABNORMAL HIGH (ref 0.44–1.00)
GFR, Estimated: 43 mL/min — ABNORMAL LOW (ref >60)
Glucose, Bld: 103 mg/dL — ABNORMAL HIGH (ref 70–99)
Potassium: 4.2 mmol/L (ref 3.5–5.1)
Sodium: 141 mmol/L (ref 135–145)
Total Bilirubin: 0.6 mg/dL (ref 0.3–1.2)
Total Protein: 7.5 g/dL (ref 6.5–8.1)

## 2022-10-05 LAB — VITAMIN D 25 HYDROXY (VIT D DEFICIENCY, FRACTURES): Vit D, 25-Hydroxy: 41.55 ng/mL (ref 30–100)

## 2022-10-05 MED ORDER — ANASTROZOLE 1 MG PO TABS: 1.0000 mg | ORAL_TABLET | Freq: Every day | ORAL | 3 refills | Status: DC

## 2022-10-05 NOTE — Progress Notes (Signed)
[facility] CONSULT NOTE  Patient Care Team: Al Corpus, MD as PCP - General (Obstetrics and Gynecology) Earna Coder, MD as Consulting Physician (Oncology) [name], MD as Consulting Physician (Radiation Oncology) Salena Saner, MD as Consulting Physician (Pulmonary Disease)  CHIEF COMPLAINTS/PURPOSE OF CONSULTATION: DCIS/ Thrombocytosis.  Oncology History Overview Note   # DCIS LEFT [MAY 2023- s/p WBRT [Dr.Crystal.]; ER POSITIVE; JUNE 2023-start anastrozole.  # hx of breast cancer s/p mastectomy [[facility]]  # THROMBOCYTOSIS [NOv 2021 605 -platelets- ; Hb;10.5 white count-WNL]; MARCH 2022-JAK-2NEG; CALR-NEG.BCR-ABL-NEGATIVE.     Ductal carcinoma in situ (DCIS) of left breast  04/15/2015 Initial Diagnosis   Ductal carcinoma in situ (DCIS) of left breast   05/08/2021 Cancer Staging   Staging form: Breast, AJCC 7th Edition - Clinical stage from 05/08/2021: Stage 0 (Tis (DCIS), N0, M0) - Signed by Jeralyn Ruths, MD on 05/08/2021    HISTORY OF PRESENTING ILLNESS: Patient ambulating-independently with assistance.  Alone.  Gail Stevenson 67 y.o.  female pleasant prior history of breast cancer on the right side-s/p mastectomy; and currently with left DCIS s/p radiation  is here for follow-up.  Patient says that she is feeling ok.  Her ankles stay real swollen. Complains of both bottom of her feet feel like they are on fire   Denies any nausea vomiting.  Denies any fevers or chills.   Review of Systems  Constitutional:  Positive for malaise/fatigue. Negative for chills, diaphoresis, fever and weight loss.  HENT:  Negative for nosebleeds and sore throat.   Eyes:  Negative for double vision.  Respiratory:  Negative for cough, hemoptysis, sputum production, shortness of breath and wheezing.   Cardiovascular:  Negative for chest pain, palpitations, orthopnea and leg swelling.  Gastrointestinal:  Negative for abdominal pain, blood in stool,  constipation, diarrhea, heartburn, melena, nausea and vomiting.  Genitourinary:  Negative for dysuria, frequency and urgency.  Musculoskeletal:  Positive for back pain and joint pain.  Skin: Negative.  Negative for itching and rash.  Neurological:  Negative for dizziness, tingling, focal weakness, weakness and headaches.  Endo/Heme/Allergies:  Does not bruise/bleed easily.  Psychiatric/Behavioral:  Negative for depression. The patient is not nervous/anxious and does not have insomnia.      MEDICAL HISTORY:  Past Medical History:  Diagnosis Date   Asthma    Breast cancer (HCC)    Diabetes mellitus without complication (HCC)    Type 2   Diastolic dysfunction    Diverticulitis    Esophageal varices without bleeding (HCC)    Fothergill's neuralgia    Hemorrhoids    Hepatitis C    History of breast cancer 2003   right side total mastectomy- DCIS   Hypertension    Leg swelling    Leukocytosis    LVH (left ventricular hypertrophy)    Morbid obesity (HCC)    Osteopenia    Personal history of chemotherapy    Sepsis (HCC)    SIRS (systemic inflammatory response syndrome) (HCC)    Thrombocytosis    Trigeminal neuralgia     SURGICAL HISTORY: Past Surgical History:  Procedure Laterality Date   BREAST BIOPSY Left 03/14/2021   [place] bx ribbon marker,differential diagnosisi of intraductal papilloma with ADH or DCIS   BREAST LUMPECTOMY WITH RADIOFREQUENCY TAG IDENTIFICATION Left 04/27/2021   Procedure: BREAST LUMPECTOMY WITH RADIOFREQUENCY TAG IDENTIFICATION;  Surgeon: Henrene Dodge, MD;  Location: [facility] ORS;  Service: General;  Laterality: Left;   BREAST SURGERY Right 2003   total mastectomy  COLONOSCOPY  2019   LAPAROTOMY N/A 01/25/2019   Procedure: EXPLORATORY LAPAROTOMY, ILEOCECECTOMY;  Surgeon: Violeta Gelinas, MD;  Location: [facility] OR;  Service: General;  Laterality: N/A;   MASTECTOMY Right 2003   total   UPPER GI ENDOSCOPY      SOCIAL HISTORY: Social History   Socioeconomic  History   Marital status: Divorced    Spouse name: Not on file   Number of children: 1   Years of education: Not on file   Highest education level: Not on file  Occupational History   Not on file  Tobacco Use   Smoking status: Former    Current packs/day: 0.00    Average packs/day: 0.5 packs/day for 10.0 years (5.0 ttl pk-yrs)    Types: Cigarettes    Start date: 05/18/1982    Quit date: 05/17/1992    Years since quitting: 30.4   Smokeless tobacco: Never  Vaping Use   Vaping status: Never Used  Substance and Sexual Activity   Alcohol use: Not Currently    Comment: Occasional beer   Drug use: No   Sexual activity: Yes  Other Topics Concern   Not on file  Social History Narrative   Lives with deceased daughter's boyfriend and ex-husband in [place]; Daughter assists in medical care. Hx of smoking; recovering alcohol. Used to work-house keeping/construction.    Social Determinants of Health   Financial Resource Strain: High Risk (11/06/2021)   Overall Financial Resource Strain (CARDIA)    Difficulty of Paying Living Expenses: Hard  Food Insecurity: No Food Insecurity (06/14/2022)   Hunger Vital Sign    Worried About Running Out of Food in the Last Year: Never true    Ran Out of Food in the Last Year: Never true  Transportation Needs: No Transportation Needs (06/14/2022)   PRAPARE - Administrator, Civil Service (Medical): No    Lack of Transportation (Non-Medical): No  Recent Concern: Transportation Needs - Unmet Transportation Needs (05/01/2022)   PRAPARE - Transportation    Lack of Transportation (Medical): Yes    Lack of Transportation (Non-Medical): Yes  Physical Activity: Insufficiently Active (11/06/2021)   Exercise Vital Sign    Days of Exercise per Week: 3 days    Minutes of Exercise per Session: 30 min  Stress: Stress Concern Present (11/06/2021)   [employer] of Occupational Health - Occupational Stress Questionnaire    Feeling of Stress : To some  extent  Social Connections: Socially Integrated (11/06/2021)   Social Connection and Isolation Panel [NHANES]    Frequency of Communication with Friends and Family: More than three times a week    Frequency of Social Gatherings with Friends and Family: More than three times a week    Attends Religious Services: More than 4 times per year    Active Member of [employer] or Organizations: Yes    Attends Banker Meetings: 1 to 4 times per year    Marital Status: Married  Catering manager Violence: Not At Risk (06/14/2022)   Humiliation, Afraid, Rape, and Kick questionnaire    Fear of Current or Ex-Partner: No    Emotionally Abused: No    Physically Abused: No    Sexually Abused: No    FAMILY HISTORY: Family History  Problem Relation Age of Onset   Gastric cancer Father 34   Breast cancer Maternal Aunt    Breast cancer Daughter 74   Stomach cancer Other    Hypertension Neg Hx    Stroke Neg Hx  ALLERGIES:  is allergic to metformin, patanol [olopatadine], and paroxetine hcl.  MEDICATIONS:  Current Outpatient Medications  Medication Sig Dispense Refill   acetaminophen (TYLENOL) 160 MG/5ML liquid Take 500 mg by mouth every 4 (four) hours as needed for fever.     acetaminophen (TYLENOL) 500 MG tablet Take 2 tablets (1,000 mg total) by mouth every 6 (six) hours as needed for mild pain.     anastrozole (ARIMIDEX) 1 MG tablet Take 1 tablet (1 mg total) by mouth daily. 90 tablet 3   aspirin EC 81 MG tablet Take 81 mg by mouth daily.     atorvastatin (LIPITOR) 40 MG tablet Take 40 mg by mouth at bedtime.      benzonatate (TESSALON) 100 MG capsule Take 2 capsules (200 mg total) by mouth 3 (three) times daily as needed for cough. 30 capsule 0   carvedilol (COREG) 12.5 MG tablet Take 12.5 mg by mouth 2 (two) times daily.     clotrimazole (LOTRIMIN AF) 1 % cream Apply 1 Application topically 2 (two) times daily. 30 g 0   empagliflozin (JARDIANCE) 10 MG TABS tablet Take 10 mg by mouth  daily.     ENTRESTO 24-26 MG Take 1 tablet by mouth 2 (two) times daily.     fluconazole (DIFLUCAN) 40 MG/ML suspension Take 100 mg by mouth daily.     fluticasone-salmeterol (ADVAIR) 250-50 MCG/ACT AEPB Inhale 1 puff into the lungs in the morning and at bedtime.     gabapentin (NEURONTIN) 100 MG capsule Take 100 mg by mouth 2 (two) times daily.     LANTUS SOLOSTAR 100 UNIT/ML Solostar Pen Inject 56 Units into the skin at bedtime.     levalbuterol (XOPENEX HFA) 45 MCG/ACT inhaler Inhale 2 puffs into the lungs every 4 (four) hours as needed for wheezing.     loperamide (IMODIUM) 2 MG capsule Take 2 mg by mouth 4 (four) times daily as needed for diarrhea or loose stools.     magnesium oxide (MAG-OX) 400 MG tablet Take 800 mg by mouth 2 (two) times daily.     metroNIDAZOLE (FLAGYL) 500 MG tablet Take 500 mg by mouth 2 (two) times daily.     metroNIDAZOLE (METROGEL) 0.75 % vaginal gel Place 1 Applicatorful vaginally at bedtime.     montelukast (SINGULAIR) 10 MG tablet Take 10 mg by mouth at bedtime.     Multiple Vitamin (MULTIVITAMIN WITH MINERALS) TABS tablet Take 4 tablets by mouth daily.     NIFEdipine (PROCARDIA-XL/NIFEDICAL-XL) 30 MG 24 hr tablet Take 30 mg by mouth daily.     omeprazole (PRILOSEC) 20 MG capsule Take 20 mg by mouth daily.     potassium chloride (KLOR-CON) 20 MEQ packet Take 20 mEq by mouth 2 (two) times daily for 5 days. 10 packet 0   PROAIR HFA 108 (90 Base) MCG/ACT inhaler Inhale 2 puffs into the lungs every 6 (six) hours as needed for wheezing or shortness of breath.   5   SYMBICORT 160-4.5 MCG/ACT inhaler Inhale 2 puffs into the lungs daily.     No current facility-administered medications for this visit.     PHYSICAL EXAMINATION:   Vitals:   10/05/22 1323  BP: (!) 148/67  Pulse: 82  Temp: 98.7 F (37.1 C)  SpO2: 100%   Filed Weights   10/05/22 1323  Weight: 182 lb 3.2 oz (82.6 kg)   Left BREAST exam [in the presence of nurse]- no unusual skin changes or  dominant masses felt. Surgical scars noted.  Right mastectomy scar-no lumps or bumps.  Bilateral thighs-ringworm infection.  Physical Exam Constitutional:      Comments: Ambulating independently.   HENT:     Head: Normocephalic and atraumatic.     Mouth/Throat:     Pharynx: No oropharyngeal exudate.  Eyes:     Pupils: Pupils are equal, round, and reactive to light.  Cardiovascular:     Rate and Rhythm: Normal rate and regular rhythm.  Pulmonary:     Effort: Pulmonary effort is normal. No respiratory distress.     Breath sounds: Normal breath sounds. No wheezing.  Abdominal:     General: Bowel sounds are normal. There is no distension.     Palpations: Abdomen is soft. There is no mass.     Tenderness: There is no abdominal tenderness. There is no guarding or rebound.  Musculoskeletal:        General: No tenderness. Normal range of motion.     Cervical back: Normal range of motion and neck supple.  Skin:    General: Skin is warm.  Neurological:     Mental Status: She is alert and oriented to person, place, and time.  Psychiatric:        Mood and Affect: Affect normal.      LABORATORY DATA:  I have reviewed the data as listed Lab Results  Component Value Date   WBC 10.7 (H) 10/05/2022   HGB 12.3 10/05/2022   HCT 38.2 10/05/2022   MCV 92.0 10/05/2022   PLT 287 10/05/2022   Recent Labs    10/20/21 1324 11/01/21 1336 03/21/22 1032 06/13/22 1440 06/13/22 1633 06/14/22 0448 09/08/22 2231 10/05/22 1331  NA 141   < > 140   < > 138 139 139 141  K 3.2*   < > 3.9   < > 3.3* 3.4* 3.3* 4.2  CL 106   < > 104   < > 106 107 106 106  CO2 25   < > 27   < > 24 23 25 27   GLUCOSE 144*   < > 119*   < > 191* 217* 175* 103*  BUN 16   < > 25*   < > 22 18 24* 26*  CREATININE 1.11*   < > 1.32*   < > 1.35* 1.15* 1.49* 1.36*  CALCIUM 8.9   < > 9.3   < > 8.9 8.8* 8.8* 9.2  GFRNONAA 55*   < > 44*   < > 43* 52* 38* 43*  PROT 7.8   < > 7.6  --  7.3  --   --  7.5  ALBUMIN 4.0   < >  4.0  --  3.4*  --   --  3.8  AST 22   < > 33  --  21  --   --  22  ALT 19   < > 35  --  17  --   --  22  ALKPHOS 84   < > 106  --  78  --   --  75  BILITOT 0.4   < > 0.2*  --  0.6  --   --  0.6  BILIDIR 0.1  --   --   --   --   --   --   --   IBILI 0.3  --   --   --   --   --   --   --    < > = values in this interval not  displayed.     CT Angio Chest PE W/Cm &/Or Wo Cm  Result Date: 09/09/2022 CLINICAL DATA:  Chest pain since 9 p.m.  PE suspected EXAM: CT ANGIOGRAPHY CHEST WITH CONTRAST TECHNIQUE: Multidetector CT imaging of the chest was performed using the standard protocol during bolus administration of intravenous contrast. Multiplanar CT image reconstructions and MIPs were obtained to evaluate the vascular anatomy. RADIATION DOSE REDUCTION: This exam was performed according to the departmental dose-optimization program which includes automated exposure control, adjustment of the mA and/or kV according to patient size and/or use of iterative reconstruction technique. CONTRAST:  OMNIPAQUE IOHEXOL 350 MG/ML SOLN COMPARISON:  Chest radiograph 09/10/2022 and CTA chest 06/14/2022 FINDINGS: Cardiovascular: Negative for acute pulmonary embolism. Normal caliber thoracic aorta. No pericardial effusion. Coronary artery and aortic atherosclerotic calcification. Mediastinum/Nodes: Trachea and esophagus are unremarkable. No thoracic adenopathy Lungs/Pleura: Mild bronchial wall thickening. Mosaic attenuation of the lungs compatible with air trapping. No focal consolidation, pleural effusion, or pneumothorax. Upper Abdomen: No acute abnormality. Musculoskeletal: No acute fracture.  Right mastectomy. Review of the MIP images confirms the above findings. IMPRESSION: 1. Negative for acute pulmonary embolism. 2. Mild bronchial wall thickening with air trapping compatible with small airways disease. Aortic Atherosclerosis (ICD10-I70.0). Electronically Signed   By: Minerva Fester M.D.   On: 09/09/2022 02:52    DG Chest 2 View  Result Date: 09/08/2022 CLINICAL DATA:  Chest pain since 9 p.m. EXAM: CHEST - 2 VIEW COMPARISON:  06/13/2022 FINDINGS: The heart size and mediastinal contours are within normal limits. Both lungs are clear. The visualized skeletal structures are unremarkable. IMPRESSION: No active cardiopulmonary disease. Electronically Signed   By: Sharlet Salina M.D.   On: 09/08/2022 22:54      Ductal carcinoma in situ (DCIS) of left breast # LEFT BREAST DCIS- ER POSITIVE s/p Lumpectomty [s/p WBRT]; continue Anastrazole 1mg /day. Tolerating well except for joint pain. UNI LEFT Mammogram- AUg 2023 [dr.Piscoya- wnl. ] STABLE; pending mammo- AUG 26th.   # Hx of right breast cancer/DICS [[facility]] s/p mastectomy- Clinically no evidence of recurrence. Stable.   # CKD stage-III- stable- #Hypertension-poorly controlled-140-150s systolic.  Discussed that poorly controlled blood pressures can cause a stroke/heart attacks and other cardiovascular problems. recommend compliance with medication/also checking blood pressures at home frequently.keep a log of blood pressures/and bring it to PCPs attention.     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 Monitor for now.   # 2023- BMD- Osteopenia- T score: - 1.7- continue ca+ vit D.  Discuss.   #Genetics: s/p counselling negative genetic testing.   # DISPOSITION: # follow up in 6 months- MD: labs- cbc/cmp; vit D 25-OH-  Dr.B     Earna Coder, MD 10/05/2022 3:02 PM
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
## 2022-10-05 NOTE — Assessment & Plan Note (Signed)
#   LEFT BREAST DCIS- ER POSITIVE s/p Lumpectomty [s/p WBRT]; continue Anastrazole 1mg /day. Tolerating well except for joint pain. UNI LEFT Mammogram- AUg 2023 [dr.Piscoya- wnl. ] STABLE; pending mammo- AUG 26th.   # Hx of right breast cancer/DICS [[facility]] s/p mastectomy- Clinically no evidence of recurrence. Stable.   # CKD stage-III- stable- #Hypertension-poorly controlled-140-150s systolic.  Discussed that poorly controlled blood pressures can cause a stroke/heart attacks and other cardiovascular problems. recommend compliance with medication/also checking blood pressures at home frequently.keep a log of blood pressures/and bring it to PCPs attention.     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 Monitor for now.   # 2023- BMD- Osteopenia- T score: - 1.7- continue ca+ vit D.  Discuss.   #Genetics: s/p counselling negative genetic testing.   # DISPOSITION: # follow up in 6 months- MD: labs- cbc/cmp; vit D 25-OH-  Dr.B

## 2022-10-08 ENCOUNTER — Other Ambulatory Visit: Payer: 59

## 2022-10-17 ENCOUNTER — Ambulatory Visit: Payer: 59 | Admitting: Surgery

## 2022-10-18 ENCOUNTER — Other Ambulatory Visit: Payer: 59

## 2022-10-22 ENCOUNTER — Ambulatory Visit
Admission: RE | Admit: 2022-10-22 | Discharge: 2022-10-22 | Disposition: A | Discharge status: Home / Self Care | Payer: 59 | Source: Ambulatory Visit | Attending: Radiation Oncology | Admitting: Radiation Oncology

## 2022-10-22 ENCOUNTER — Ambulatory Visit: Payer: 59 | Admitting: Surgery

## 2022-10-22 ENCOUNTER — Encounter: Payer: Self-pay | Admitting: Surgery

## 2022-10-22 ENCOUNTER — Encounter: Payer: Self-pay | Admitting: Radiation Oncology

## 2022-10-22 ENCOUNTER — Ambulatory Visit (INDEPENDENT_AMBULATORY_CARE_PROVIDER_SITE_OTHER): Payer: 59 | Admitting: Surgery

## 2022-10-22 VITALS — BP 151/86 | HR 80 | Temp 97.9°F | Resp 20 | Wt 181.0 lb

## 2022-10-22 VITALS — BP 144/57 | HR 91 | Temp 97.7°F | Ht 61.0 in | Wt 179.8 lb

## 2022-10-22 DIAGNOSIS — Z9011 Acquired absence of right breast and nipple: Secondary | ICD-10-CM | POA: Insufficient documentation

## 2022-10-22 DIAGNOSIS — D0512 Intraductal carcinoma in situ of left breast: Secondary | ICD-10-CM | POA: Diagnosis present

## 2022-10-22 DIAGNOSIS — Z170 Estrogen receptor positive status [ER+]: Secondary | ICD-10-CM | POA: Insufficient documentation

## 2022-10-22 DIAGNOSIS — Z923 Personal history of irradiation: Secondary | ICD-10-CM | POA: Insufficient documentation

## 2022-10-22 DIAGNOSIS — Z79811 Long term (current) use of aromatase inhibitors: Secondary | ICD-10-CM | POA: Diagnosis not present

## 2022-10-22 DIAGNOSIS — Z86711 Personal history of pulmonary embolism: Secondary | ICD-10-CM | POA: Diagnosis not present

## 2022-10-22 NOTE — Patient Instructions (Addendum)
Breast Self-Awareness Breast self-awareness is knowing how your breasts look and feel. You need to: Check your breasts on a regular basis. Tell your doctor about any changes. Become familiar with the look and feel of your breasts. This can help you catch a breast problem while it is still small and can be treated. You should do breast self-exams even if you have breast implants. What you need: A mirror. A well-lit room. A pillow or other soft object. How to do a breast self-exam Follow these steps to do a breast self-exam: Look for changes  Take off all the clothes above your waist. Stand in front of a mirror in a room with good lighting. Put your hands down at your sides. Compare your breasts in the mirror. Look for any difference between them, such as: A difference in shape. A difference in size. Wrinkles, dips, and bumps in one breast and not the other. Look at each breast for changes in the skin, such as: Redness. Scaly areas. Skin that has gotten thicker. Dimpling. Open sores (ulcers). Look for changes in your nipples, such as: Fluid coming out of a nipple. Fluid around a nipple. Bleeding. Dimpling. Redness. A nipple that looks pushed in (retracted), or that has changed position. Feel for changes Lie on your back. Feel each breast. To do this: Pick a breast to feel. Place a pillow under the shoulder closest to that breast. Put the arm closest to that breast behind your head. Feel the nipple area of that breast using the hand of your other arm. Feel the area with the pads of your three middle fingers by making small circles with your fingers. Use light, medium, and firm pressure. Continue the overlapping circles, moving downward over the breast. Keep making circles with your fingers. Stop when you feel your ribs. Start making circles with your fingers again, this time going upward until you reach your collarbone. Then, make circles outward across your breast and into your  armpit area. Squeeze your nipple. Check for discharge and lumps. Repeat these steps to check your other breast. Sit or stand in the tub or shower. With soapy water on your skin, feel each breast the same way you did when you were lying down. Write down what you find Writing down what you find can help you remember what to tell your doctor. Write down: What is normal for each breast. Any changes you find in each breast. These include: The kind of changes you find. A tender or painful breast. Any lump you find. Write down its size and where it is. When you last had your monthly period (menstrual cycle). General tips If you are breastfeeding, the best time to check your breasts is after you feed your baby or after you use a breast pump. If you get monthly bleeding, the best time to check your breasts is 5-7 days after your monthly cycle ends. With time, you will become comfortable with the self-exam. You will also start to know if there are changes in your breasts. Contact a doctor if: You see a change in the shape or size of your breasts or nipples. You see a change in the skin of your breast or nipples, such as red or scaly skin. You have fluid coming from your nipples that is not normal. You find a new lump or thick area. You have breast pain. You have any concerns about your breast health. Summary Breast self-awareness includes looking for changes in your breasts and feeling for changes   within your breasts. You should do breast self-awareness in front of a mirror in a well-lit room. If you get monthly periods (menstrual cycles), the best time to check your breasts is 5-7 days after your period ends. Tell your doctor about any changes you see in your breasts. Changes include changes in size, changes on the skin, painful or tender breasts, or fluid from your nipples that is not normal. This information is not intended to replace advice given to you by your health care provider. Make sure  you discuss any questions you have with your health care provider. Document Revised: 07/06/2021 Document Reviewed: 12/01/2020 Elsevier Patient Education  2024 [employer].

## 2022-10-22 NOTE — Progress Notes (Signed)
10/22/2022  History of Present Illness: Gail Stevenson is a 67 y.o. female status post left breast lumpectomy on 04/27/2021.  Final pathology showed DCIS.  She completed radiation therapy and is currently on Arimidex.  She is due for mammogram tomorrow.  The patient reports that more recently she has been having some issues with chest pain and has been referred to cardiology for this.  She also found out that she has had some kidney issues as well.  The breast standpoint, the patient reports some intermittent soreness in the left breast but no constant or continuous pain.  Past Medical History: Past Medical History:  Diagnosis Date   Asthma    Breast cancer (HCC)    Diabetes mellitus without complication (HCC)    Type 2   Diastolic dysfunction    Diverticulitis    Esophageal varices without bleeding (HCC)    Fothergill's neuralgia    Hemorrhoids    Hepatitis C    History of breast cancer 2003   right side total mastectomy- DCIS   Hypertension    Leg swelling    Leukocytosis    LVH (left ventricular hypertrophy)    Morbid obesity (HCC)    Osteopenia    Personal history of chemotherapy    Sepsis (HCC)    SIRS (systemic inflammatory response syndrome) (HCC)    Thrombocytosis    Trigeminal neuralgia      Past Surgical History: Past Surgical History:  Procedure Laterality Date   BREAST BIOPSY Left 03/14/2021   [place] bx ribbon marker,differential diagnosisi of intraductal papilloma with ADH or DCIS   BREAST LUMPECTOMY WITH RADIOFREQUENCY TAG IDENTIFICATION Left 04/27/2021   Procedure: BREAST LUMPECTOMY WITH RADIOFREQUENCY TAG IDENTIFICATION;  Surgeon: Henrene Dodge, MD;  Location: [facility] ORS;  Service: General;  Laterality: Left;   BREAST SURGERY Right 2003   total mastectomy   COLONOSCOPY  2019   LAPAROTOMY N/A 01/25/2019   Procedure: EXPLORATORY LAPAROTOMY, ILEOCECECTOMY;  Surgeon: Violeta Gelinas, MD;  Location: [facility] OR;  Service: General;  Laterality: N/A;   MASTECTOMY Right 2003    total   UPPER GI ENDOSCOPY      Home Medications: Prior to Admission medications   Medication Sig Start Date End Date Taking? Authorizing Provider  acetaminophen (TYLENOL) 160 MG/5ML liquid Take 500 mg by mouth every 4 (four) hours as needed for fever.   Yes [provider]  acetaminophen (TYLENOL) 500 MG tablet Take 2 tablets (1,000 mg total) by mouth every 6 (six) hours as needed for mild pain. 04/27/21  Yes Beverly Suriano, Elita Quick, MD  anastrozole (ARIMIDEX) 1 MG tablet Take 1 tablet (1 mg total) by mouth daily. 10/05/22  Yes Earna Coder, MD  aspirin EC 81 MG tablet Take 81 mg by mouth daily.   Yes [provider]  atorvastatin (LIPITOR) 40 MG tablet Take 40 mg by mouth at bedtime.    Yes [provider]  benzonatate (TESSALON) 100 MG capsule Take 2 capsules (200 mg total) by mouth 3 (three) times daily as needed for cough. 06/15/22  Yes Sreenath, Sudheer B, MD  carvedilol (COREG) 12.5 MG tablet Take 12.5 mg by mouth 2 (two) times daily. 03/20/21  Yes [provider]  clotrimazole (LOTRIMIN AF) 1 % cream Apply 1 Application topically 2 (two) times daily. 05/01/22  Yes Earna Coder, MD  empagliflozin (JARDIANCE) 10 MG TABS tablet Take 10 mg by mouth daily. 05/17/22  Yes [provider]  ENTRESTO 24-26 MG Take 1 tablet by mouth 2 (two) times daily.  03/09/21  Yes [provider]  fluconazole (DIFLUCAN) 40 MG/ML suspension Take 100 mg by mouth daily. 06/12/22  Yes [provider]  fluticasone-salmeterol (ADVAIR) 250-50 MCG/ACT AEPB Inhale 1 puff into the lungs in the morning and at bedtime.   Yes [provider]  gabapentin (NEURONTIN) 100 MG capsule Take 100 mg by mouth 2 (two) times daily. 03/19/21  Yes [provider]  LANTUS SOLOSTAR 100 UNIT/ML Solostar Pen Inject 56 Units into the skin at bedtime.   Yes [provider]  levalbuterol (XOPENEX HFA) 45 MCG/ACT inhaler Inhale 2 puffs into the lungs every 4  (four) hours as needed for wheezing.   Yes [provider]  loperamide (IMODIUM) 2 MG capsule Take 2 mg by mouth 4 (four) times daily as needed for diarrhea or loose stools. 03/27/21  Yes [provider]  magnesium oxide (MAG-OX) 400 MG tablet Take 800 mg by mouth 2 (two) times daily. 05/17/22 05/17/23 Yes [provider]  metroNIDAZOLE (FLAGYL) 500 MG tablet Take 500 mg by mouth 2 (two) times daily. 06/12/22  Yes [provider]  metroNIDAZOLE (METROGEL) 0.75 % vaginal gel Place 1 Applicatorful vaginally at bedtime. 05/31/22  Yes [provider]  montelukast (SINGULAIR) 10 MG tablet Take 10 mg by mouth at bedtime. 12/26/18  Yes [provider]  Multiple Vitamin (MULTIVITAMIN WITH MINERALS) TABS tablet Take 4 tablets by mouth daily.   Yes [provider]  NIFEdipine (PROCARDIA-XL/NIFEDICAL-XL) 30 MG 24 hr tablet Take 30 mg by mouth daily. 04/30/22  Yes [provider]  omeprazole (PRILOSEC) 20 MG capsule Take 20 mg by mouth daily. 01/16/21  Yes [provider]  PROAIR HFA 108 (90 Base) MCG/ACT inhaler Inhale 2 puffs into the lungs every 6 (six) hours as needed for wheezing or shortness of breath.    Yes [provider]  SYMBICORT 160-4.5 MCG/ACT inhaler Inhale 2 puffs into the lungs daily.   Yes [provider]  potassium chloride (KLOR-CON) 20 MEQ packet Take 20 mEq by mouth 2 (two) times daily for 5 days. 06/26/21 06/13/22  Georga Hacking, MD    Allergies: Allergies  Allergen Reactions   Metformin Other (See Comments) and Swelling    Unable to walk   Patanol [Olopatadine]     Unknown reaction    Paroxetine Hcl Rash    Review of Systems: Review of Systems  Constitutional:  Negative for chills and fever.  Respiratory:  Negative for shortness of breath.   Cardiovascular:  Positive for chest pain.  Gastrointestinal:  Negative for nausea and vomiting.  Skin:  Negative for rash.    Physical  Exam BP (!) 144/57   Pulse 91   Temp 97.7 F (36.5 C) (Oral)   Ht 5\' 1"  (1.549 m)   Wt 179 lb 12.8 oz (81.6 kg)   SpO2 98%   BMI 33.97 kg/m  CONSTITUTIONAL: No acute distress HEENT:  Normocephalic, atraumatic, extraocular motion intact. RESPIRATORY:  Normal respiratory effort without pathologic use of accessory muscles. CARDIOVASCULAR: Regular rhythm and rate BREAST: Left breast status post lumpectomy with periareolar incision well-healed.  No palpable masses, skin changes other than radiation changes, or nipple changes.  No left axillary lymphadenopathy.  Right breast status postmastectomy with scar well-healed and no palpable masses.  No right axillary lymphadenopathy. NEUROLOGIC:  Motor and sensation is grossly normal.  Cranial nerves are grossly intact. PSYCH:  Alert and oriented to person, place and time. Affect is normal.  Labs/Imaging: Labs from 10/05/2022: Sodium 141,  potassium 4.2, chloride 106, CO2 27, BUN 26, creatinine 1.36.  White blood cell count 10.7, hemoglobin 12.3, hematocrit 38.2, platelets 287.  Assessment and Plan: This is a 67 y.o. female status post left breast lumpectomy.  - Discussed with patient that she is about 1.5 years out from her surgery.  She is currently doing well from the breast standpoint without any concerns on exam today.  She has a mammogram pending tomorrow which will help [place] further evaluate.  Discussed with her that as long as a mammogram looks okay tomorrow, then we will have another follow-up in 6 months for another breast exam.  I will let the patient know if there are any findings of concern that would warrant biopsy otherwise. - Follow-up in 6 months.  I spent 20 minutes dedicated to the care of this patient on the date of this encounter to include pre-visit review of records, face-to-face time with the patient discussing diagnosis and management, and any post-visit coordination of care.   Howie Ill, MD  [facility]

## 2022-10-22 NOTE — Progress Notes (Signed)
Radiation Oncology Follow up Note  Name: Gail Stevenson   Date:   10/22/2022 MRN:  [id number] DOB: 09-Feb-1956    This 67 y.o. 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  Seen today in routine follow-up she is doing well.  She specifically denies breast tenderness cough or bone pain.  She is currently on Arimidex tolerating it well without side effect..  She is scheduled for a left breast mammogram tomorrow.  She had a CT scan back in July for acute pulmonary embolism showing no evidence of embolism.  Patient is status post right mastectomy  COMPLICATIONS OF TREATMENT: none  FOLLOW UP COMPLIANCE: keeps appointments   PHYSICAL EXAM:  BP (!) 151/86 Comment: patient monitors at home she did not take her meds this morning  Pulse 80   Temp 97.9 F (36.6 C)   Resp 20   Wt 181 lb (82.1 kg)   BMI 34.20 kg/m  Right chest wall is clear without evidence of nodularity or mass.  Left breast is free of dominant mass.  No axillary or supraclavicular adenopathy is appreciated.  Well-developed well-nourished patient in NAD. HEENT reveals PERLA, EOMI, discs not visualized.  Oral cavity is clear. No oral mucosal lesions are identified. Neck is clear without evidence of cervical or supraclavicular adenopathy. Lungs are clear to A&P. Cardiac examination is essentially unremarkable with regular rate and rhythm without murmur rub or thrill. Abdomen is benign with no organomegaly or masses noted. Motor sensory and DTR levels are equal and symmetric in the upper and lower extremities. Cranial nerves II through XII are grossly intact. Proprioception is intact. No peripheral adenopathy or edema is identified. No  motor or sensory levels are noted. Crude visual fields are within normal range.  RADIOLOGY RESULTS: Breast mammogram performed tomorrow will be reviewed  PLAN: Present time patient is doing well with no evidence of disease now out 1 year.  I will review her unilateral mammogram tomorrow.  I have asked to see her back in 1 year for follow-up.  She continues on Arimidex without side effect.  Patient knows to call with any concerns.  I would like to take this opportunity to thank you for allowing me to participate in the care of your patient.Gail Miller, MD

## 2022-10-23 ENCOUNTER — Ambulatory Visit
Admission: RE | Admit: 2022-10-23 | Discharge: 2022-10-23 | Disposition: A | Discharge status: Home / Self Care | Payer: 59 | Source: Ambulatory Visit | Attending: Surgery

## 2022-10-23 ENCOUNTER — Ambulatory Visit
Admission: RE | Admit: 2022-10-23 | Discharge: 2022-10-23 | Disposition: A | Discharge status: Home / Self Care | Payer: 59 | Source: Ambulatory Visit | Attending: Surgery | Admitting: Surgery

## 2022-10-23 DIAGNOSIS — D0512 Intraductal carcinoma in situ of left breast: Secondary | ICD-10-CM | POA: Diagnosis present

## 2022-10-23 HISTORY — DX: Personal history of irradiation: Z92.3

## 2022-10-31 ENCOUNTER — Other Ambulatory Visit: Payer: 59

## 2022-10-31 ENCOUNTER — Ambulatory Visit: Payer: 59 | Admitting: Internal Medicine

## 2022-11-08 ENCOUNTER — Other Ambulatory Visit: Payer: 59

## 2022-11-16 ENCOUNTER — Ambulatory Visit: Payer: 59 | Admitting: Cardiology

## 2022-11-20 ENCOUNTER — Ambulatory Visit (INDEPENDENT_AMBULATORY_CARE_PROVIDER_SITE_OTHER): Payer: 59 | Admitting: Nurse Practitioner

## 2022-11-20 ENCOUNTER — Other Ambulatory Visit
Admission: RE | Admit: 2022-11-20 | Discharge: 2022-11-20 | Disposition: A | Discharge status: Home / Self Care | Payer: 59 | Source: Ambulatory Visit | Attending: Nurse Practitioner | Admitting: *Deleted

## 2022-11-20 ENCOUNTER — Encounter: Payer: Self-pay | Admitting: Nurse Practitioner

## 2022-11-20 ENCOUNTER — Ambulatory Visit
Admission: RE | Admit: 2022-11-20 | Discharge: 2022-11-20 | Disposition: A | Discharge status: Home / Self Care | Payer: 59 | Source: Ambulatory Visit | Attending: Nurse Practitioner | Admitting: Nurse Practitioner

## 2022-11-20 VITALS — BP 140/76 | HR 85 | Temp 98.1°F | Ht 61.0 in | Wt 179.6 lb

## 2022-11-20 DIAGNOSIS — R058 Other specified cough: Secondary | ICD-10-CM | POA: Diagnosis not present

## 2022-11-20 DIAGNOSIS — K219 Gastro-esophageal reflux disease without esophagitis: Secondary | ICD-10-CM | POA: Diagnosis not present

## 2022-11-20 DIAGNOSIS — J4540 Moderate persistent asthma, uncomplicated: Secondary | ICD-10-CM | POA: Diagnosis not present

## 2022-11-20 DIAGNOSIS — I5189 Other ill-defined heart diseases: Secondary | ICD-10-CM

## 2022-11-20 DIAGNOSIS — D0512 Intraductal carcinoma in situ of left breast: Secondary | ICD-10-CM

## 2022-11-20 DIAGNOSIS — J4530 Mild persistent asthma, uncomplicated: Secondary | ICD-10-CM

## 2022-11-20 MED ORDER — PROAIR HFA 108 (90 BASE) MCG/ACT IN AERS
2.0000 | INHALATION_SPRAY | Freq: Four times a day (QID) | RESPIRATORY_TRACT | 2 refills | Status: DC | PRN
Start: 2022-11-20 — End: 2023-02-01

## 2022-11-20 MED ORDER — FLUTICASONE PROPIONATE 50 MCG/ACT NA SUSP: 2.0000 | Freq: Every day | NASAL | 2 refills | Status: DC

## 2022-11-20 MED ORDER — PANTOPRAZOLE SODIUM 40 MG PO TBEC
40.0000 mg | DELAYED_RELEASE_TABLET | Freq: Every day | ORAL | 5 refills | Status: DC
Start: 2022-11-20 — End: 2023-04-11

## 2022-11-20 NOTE — Progress Notes (Unsigned)
@Patient  ID: Gail Stevenson, female    DOB: [birth year], 67 y.o.   MRN: [id number]  Chief Complaint  Patient presents with   Follow-up    Occasional dry cough, and shortness of breath. No wheezing. Chest pressure/tightness on and off x 1 month. Patient is using Symbicort 1 puff daily. Has not used Xopenex or ProAir since August.     Referring provider: Al Corpus, MD  HPI: 67 year old female, former smoker followed for asthma.  She is a patient Dr. Jayme Cloud and last seen in office 01/02/2022.  Past medical history significant for hypertension, diabetes, chronic hepatitis C virus, HLD, history of left breast cancer, diastolic dysfunction, obesity, PTSD.  TEST/EVENTS:  09/01/2022 CTA chest: Mild bronchial wall thickening.  Mosaic attenuation of lungs compatible with air trapping.  No acute process.  No evidence of PE.  01/02/2022: OV with Dr. Jayme Cloud.  Referred for evaluation of wheezing.  Challenging historian.  States that she has a history of asthma for many years.  Does not feel like inhalers help her but not sure what inhaler she is on.  Had multiple with her including albuterol/lev albuterol, Wixela and Symbicort.  She also stated that she had a purple inhaler but would like a disc at home, suspected to be Advair.  Does not use anything regularly but rotates through them.  Has a dry cough and increased congestion over the last several months.  Has had some recent sputum production that at times is yellow.  Advised to continue fluticasone/salmeterol 250/50 1 puff twice a day.  Discontinue Symbicort.  Continue albuterol as rescue.  Ordered PFTs.  Provided with azithromycin for tracheobronchitis.  11/20/2022: Today-overdue follow-up Patient presents today for overdue follow-up.  She was to be seen in 3 months from her last visit.  She is also supposed to have pulmonary function testing but did not complete these.  She still has a lot of confusion surrounding her medications.  Her  daughter has been trying to help her with these.  She is currently using Symbicort 2 puffs twice a day.  She does still have Advair at home but tells me that she has not been using this.  She also said that her PCP threw away her albuterol inhaler so she does not have a rescue inhaler currently.  She is still taking Singulair at bedtime.  She is also taking omeprazole for GERD.   Breathing overall feels stable.  She does get short of breath with certain activities, typically longer distance walking or uphill climbing.  Last course of steroids was in July 2024.  Still has difficulties with reflux and a daily dry cough.  Not noticing any wheezing.  Occasionally has chest tightness.  She does have some nasal congestion.  No nasal sprays.  Does feel better after she uses her Symbicort.  Denies any fevers, chills, chest congestion, hemoptysis, lower extremity swelling.  Allergies  Allergen Reactions   Metformin Other (See Comments) and Swelling    Unable to walk   Patanol [Olopatadine]     Unknown reaction    Paroxetine Hcl Rash    Immunization History  Administered Date(s) Administered   H1N1 02/10/2008   Hepatitis B, ADULT 09/25/2006   Influenza, Seasonal, Injecte, Preservative Fre 12/03/2007, 10/27/2008, 11/01/2009, 12/20/2011, 12/09/2012, 10/29/2022   Influenza,inj,Quad PF,6+ Mos 12/20/2011, 10/27/2014, 03/20/2016, 11/19/2016, 10/15/2017, 11/03/2018, 12/02/2019, 12/15/2020, 10/24/2021   Influenza-Unspecified 10/27/2013   Janssen (J&J) SARS-COV-2 Vaccination 05/01/2019   PNEUMOCOCCAL CONJUGATE-20 03/09/2021   Pfizer Covid-19 Vaccine Bivalent Booster 75yrs & up  03/09/2021   Pfizer(Comirnaty)Fall Seasonal Vaccine 12 years and older 10/29/2022   Pneumococcal Polysaccharide-23 02/10/2007, 12/02/2019   Tdap 03/20/2016, 09/22/2018, 01/25/2019   Zoster Recombinant(Shingrix) 07/09/2019, 12/02/2019    Past Medical History:  Diagnosis Date   Asthma    Breast cancer (HCC)    Diabetes mellitus  without complication (HCC)    Type 2   Diastolic dysfunction    Diverticulitis    Esophageal varices without bleeding (HCC)    Fothergill's neuralgia    Hemorrhoids    Hepatitis C    History of breast cancer 2003   right side total mastectomy- DCIS   Hypertension    Leg swelling    Leukocytosis    LVH (left ventricular hypertrophy)    Morbid obesity (HCC)    Osteopenia    Personal history of chemotherapy    Personal history of radiation therapy    Sepsis (HCC)    SIRS (systemic inflammatory response syndrome) (HCC)    Thrombocytosis    Trigeminal neuralgia     Tobacco History: Social History   Tobacco Use  Smoking Status Former   Current packs/day: 0.00   Average packs/day: 0.5 packs/day for 10.0 years (5.0 ttl pk-yrs)   Types: Cigarettes   Start date: 05/18/1982   Quit date: 05/17/1992   Years since quitting: 30.5  Smokeless Tobacco Never   Counseling given: Not Answered   Outpatient Medications Prior to Visit  Medication Sig Dispense Refill   acetaminophen (TYLENOL) 500 MG tablet Take 2 tablets (1,000 mg total) by mouth every 6 (six) hours as needed for mild pain.     anastrozole (ARIMIDEX) 1 MG tablet Take 1 tablet (1 mg total) by mouth daily. 90 tablet 3   aspirin EC 81 MG tablet Take 81 mg by mouth daily.     atorvastatin (LIPITOR) 40 MG tablet Take 40 mg by mouth at bedtime.      benzonatate (TESSALON) 100 MG capsule Take 2 capsules (200 mg total) by mouth 3 (three) times daily as needed for cough. 30 capsule 0   carvedilol (COREG) 12.5 MG tablet Take 12.5 mg by mouth 2 (two) times daily.     clotrimazole (LOTRIMIN AF) 1 % cream Apply 1 Application topically 2 (two) times daily. 30 g 0   empagliflozin (JARDIANCE) 10 MG TABS tablet Take 10 mg by mouth daily.     ENTRESTO 24-26 MG Take 1 tablet by mouth 2 (two) times daily.     fluconazole (DIFLUCAN) 40 MG/ML suspension Take 100 mg by mouth daily.     gabapentin (NEURONTIN) 100 MG capsule Take 100 mg by mouth 2  (two) times daily.     LANTUS SOLOSTAR 100 UNIT/ML Solostar Pen Inject 56 Units into the skin at bedtime.     loperamide (IMODIUM) 2 MG capsule Take 2 mg by mouth 4 (four) times daily as needed for diarrhea or loose stools.     magnesium oxide (MAG-OX) 400 MG tablet Take 800 mg by mouth 2 (two) times daily.     montelukast (SINGULAIR) 10 MG tablet Take 10 mg by mouth at bedtime.     Multiple Vitamin (MULTIVITAMIN WITH MINERALS) TABS tablet Take 4 tablets by mouth daily.     NIFEdipine (PROCARDIA-XL/NIFEDICAL-XL) 30 MG 24 hr tablet Take 30 mg by mouth daily.     SYMBICORT 160-4.5 MCG/ACT inhaler Inhale 1 puff into the lungs daily.     levalbuterol (XOPENEX HFA) 45 MCG/ACT inhaler Inhale 2 puffs into the lungs every 4 (four) hours as  needed for wheezing.     omeprazole (PRILOSEC) 20 MG capsule Take 20 mg by mouth daily.     PROAIR HFA 108 (90 Base) MCG/ACT inhaler Inhale 2 puffs into the lungs every 6 (six) hours as needed for wheezing or shortness of breath.   5   potassium chloride (KLOR-CON) 20 MEQ packet Take 20 mEq by mouth 2 (two) times daily for 5 days. 10 packet 0   acetaminophen (TYLENOL) 160 MG/5ML liquid Take 500 mg by mouth every 4 (four) hours as needed for fever.     fluticasone-salmeterol (ADVAIR) 250-50 MCG/ACT AEPB Inhale 1 puff into the lungs in the morning and at bedtime. (Patient not taking: Reported on 11/20/2022)     No facility-administered medications prior to visit.     Review of Systems:   Constitutional: No weight loss or gain, night sweats, fevers, chills, fatigue, or lassitude. HEENT: No headaches, difficulty swallowing, tooth/dental problems, or sore throat. No sneezing, itching, ear ache. +nasal congestion, post nasal drip CV:  No chest pain, orthopnea, PND, swelling in lower extremities, anasarca, dizziness, palpitations, syncope Resp: + shortness of breath with exertion; dry cough. No excess mucus or change in color of mucus. No hemoptysis. No wheezing.  No chest  wall deformity GI:  +heartburn, indigestion. No abdominal pain, nausea, vomiting, diarrhea, change in bowel habits, loss of appetite, bloody stools.  GU: No dysuria, change in color of urine, urgency or frequency.   Skin: No rash, lesions, ulcerations MSK:  No joint pain or swelling.   Neuro: No dizziness or lightheadedness.  Psych: No depression or anxiety. Mood stable.     Physical Exam:  BP (!) 140/76 (BP Location: Left Arm, Patient Position: Sitting, Cuff Size: Normal)   Pulse 85   Temp 98.1 F (36.7 C) (Temporal)   Ht 5\' 1"  (1.549 m)   Wt 179 lb 9.6 oz (81.5 kg)   SpO2 97%   BMI 33.94 kg/m   GEN: Pleasant, interactive, well-appearing; obese; in no acute distress. HEENT:  Normocephalic and atraumatic. PERRLA. Sclera white. Nasal turbinates erythematous, moist and patent bilaterally. No rhinorrhea present. Oropharynx pink and moist, without exudate or edema. No lesions, ulcerations NECK:  Supple w/ fair ROM. No JVD present. Normal carotid impulses w/o bruits. Thyroid symmetrical with no goiter or nodules palpated. No lymphadenopathy.   CV: RRR, no m/r/g, no peripheral edema. Pulses intact, +2 bilaterally. No cyanosis, pallor or clubbing. PULMONARY:  Unlabored, regular breathing. Clear bilaterally A&P w/o wheezes/rales/rhonchi. No accessory muscle use.  GI: BS present and normoactive. Soft, non-tender to palpation. No organomegaly or masses detected.  MSK: No erythema, warmth or tenderness. Cap refil <2 sec all extrem. No deformities or joint swelling noted.  Neuro: A/Ox3. No focal deficits noted.   Skin: Warm, no lesions or rashe Psych: Normal affect and behavior. Judgement and thought content appropriate.     Lab Results:  CBC    Component Value Date/Time   WBC 10.7 (H) 10/05/2022 1331   WBC 11.0 (H) 09/08/2022 2231   RBC 4.15 10/05/2022 1331   HGB 12.3 10/05/2022 1331   HGB 13.8 01/23/2014 1847   HCT 38.2 10/05/2022 1331   HCT 41.5 01/23/2014 1847   PLT 287  10/05/2022 1331   PLT 420 01/23/2014 1847   MCV 92.0 10/05/2022 1331   MCV 89 01/23/2014 1847   MCH 29.6 10/05/2022 1331   MCHC 32.2 10/05/2022 1331   RDW 12.9 10/05/2022 1331   RDW 13.2 01/23/2014 1847   LYMPHSABS 2.0 10/05/2022 1331  MONOABS 1.1 (H) 10/05/2022 1331   EOSABS 0.1 10/05/2022 1331   BASOSABS 0.0 10/05/2022 1331    BMET    Component Value Date/Time   NA 141 10/05/2022 1331   NA 140 01/23/2014 1847   K 4.2 10/05/2022 1331   K 3.4 (L) 01/23/2014 1847   CL 106 10/05/2022 1331   CL 104 01/23/2014 1847   CO2 27 10/05/2022 1331   CO2 28 01/23/2014 1847   GLUCOSE 103 (H) 10/05/2022 1331   GLUCOSE 187 (H) 01/23/2014 1847   BUN 26 (H) 10/05/2022 1331   BUN 16 01/23/2014 1847   CREATININE 1.36 (H) 10/05/2022 1331   CREATININE 1.13 01/23/2014 1847   CALCIUM 9.2 10/05/2022 1331   CALCIUM 8.8 01/23/2014 1847   GFRNONAA 43 (L) 10/05/2022 1331   GFRNONAA 52 (L) 01/23/2014 1847   GFRNONAA 45 (L) 10/02/2013 2340   GFRAA >60 02/01/2019 0313   GFRAA >60 01/23/2014 1847   GFRAA 52 (L) 10/02/2013 2340    BNP    Component Value Date/Time   BNP 38.7 10/20/2021 1324     Imaging:  MM 3D DIAGNOSTIC MAMMOGRAM UNILATERAL LEFT BREAST  Result Date: 10/23/2022 CLINICAL DATA:  Status post LEFT lumpectomy in March 2023 radiation for papillary lesion with atypia which was upgraded to low-grade DCIS. Remote history of RIGHT mastectomy. On Arimidex. EXAM: DIGITAL DIAGNOSTIC UNILATERAL LEFT MAMMOGRAM WITH TOMOSYNTHESIS AND CAD; ULTRASOUND LEFT BREAST LIMITED TECHNIQUE: Left digital diagnostic mammography and breast tomosynthesis was performed. The images were evaluated with computer-aided detection. ; Targeted ultrasound examination of the left breast was performed. COMPARISON:  Previous exam(s). ACR Breast Density Category b: There are scattered areas of fibroglandular density. FINDINGS: There is density and architectural distortion within the LEFT breast, consistent with postsurgical  changes. These are stable in comparison to prior. There is an asymmetry noted on MLO view only on slice 40. It persist on spot MLO slice 47. This appears more tubular on spot imaging and may reflect a blood vessel which has previously been excluded from the field of view on prior studies. No definitive correlate is identified on CC imaging. Targeted ultrasound was performed of the LEFT upper breast. No definitive sonographic correlate for the asymmetry identified. IMPRESSION: 1. There is a probably benign asymmetry in the LEFT breast on MLO view only. This is favored to reflect an area newly included in the field of view. Recommend follow-up diagnostic mammogram in 6 months. This will establish 6 months of definitive stability. RECOMMENDATION: LEFT diagnostic mammogram (with ultrasound as deemed necessary) in 6 months. I have discussed the findings and recommendations with the patient. If applicable, a reminder letter will be sent to the patient regarding the next appointment. BI-RADS CATEGORY  3: Probably benign. Electronically Signed   By: Meda Klinefelter M.D.   On: 10/23/2022 16:36   [place] LIMITED ULTRASOUND INCLUDING AXILLA LEFT BREAST   Result Date: 10/23/2022 CLINICAL DATA:  Status post LEFT lumpectomy in March 2023 radiation for papillary lesion with atypia which was upgraded to low-grade DCIS. Remote history of RIGHT mastectomy. On Arimidex. EXAM: DIGITAL DIAGNOSTIC UNILATERAL LEFT MAMMOGRAM WITH TOMOSYNTHESIS AND CAD; ULTRASOUND LEFT BREAST LIMITED TECHNIQUE: Left digital diagnostic mammography and breast tomosynthesis was performed. The images were evaluated with computer-aided detection. ; Targeted ultrasound examination of the left breast was performed. COMPARISON:  Previous exam(s). ACR Breast Density Category b: There are scattered areas of fibroglandular density. FINDINGS: There is density and architectural distortion within the LEFT breast, consistent with postsurgical changes. These are stable  in comparison to prior. There is an asymmetry noted on MLO view only on slice 40. It persist on spot MLO slice 47. This appears more tubular on spot imaging and may reflect a blood vessel which has previously been excluded from the field of view on prior studies. No definitive correlate is identified on CC imaging. Targeted ultrasound was performed of the LEFT upper breast. No definitive sonographic correlate for the asymmetry identified. IMPRESSION: 1. There is a probably benign asymmetry in the LEFT breast on MLO view only. This is favored to reflect an area newly included in the field of view. Recommend follow-up diagnostic mammogram in 6 months. This will establish 6 months of definitive stability. RECOMMENDATION: LEFT diagnostic mammogram (with ultrasound as deemed necessary) in 6 months. I have discussed the findings and recommendations with the patient. If applicable, a reminder letter will be sent to the patient regarding the next appointment. BI-RADS CATEGORY  3: Probably benign. Electronically Signed   By: Meda Klinefelter M.D.   On: 10/23/2022 16:36    Administration History     None           No data to display          No results found for: "NITRICOXIDE"      Assessment & Plan:   Asthma, moderate persistent Stable on current regimen. FeNO nl and lung exam clear. She still needs PFTs to evaluate for underlying smoking related obstructive lung disease/assess lung function. We had a lengthy discussion surrounding asthma management and inhaler use. I have sent in albuterol for rescue. She will need this in addition to her Symbicort. Advised her to dispose of the Advair. She verbalized understanding. Action plan in place. She has a component of upper airway irritation secondary to GERD and postnasal drainage which is largely contributing to her daily cough. Will change her PPI to pantoprazole. Add on intranasal steroid. Reassess at follow up.   Patient Instructions  -Start using  Symbicort 2 puffs Twice daily. Brush tongue and rinse mouth afterwards.  -Albuterol inhaler 2 puffs every 6 hours as needed for shortness of breath or wheezing. Notify if symptoms persist despite rescue inhaler/neb use. This is your rescue inhaler and should be used if you are having trouble with your breathing or wheezing You should not be using any other inhalers besides these two   Continue singulair (montelukast) 1 tab At bedtime  Continue benzonatate 1 capsule Three times a day as needed for cough  -Stop omeprazole (prilosec). Start pantoprazole 40 mg daily about 30 minutes before breakfast for reflux/heartburn  -Start flonase nasal spray 2 sprays each nostril daily in morning   Upper airway cough syndrome: Suppress your cough to allow your larynx (voice box) to heal.  Limit talking for the next few days. Avoid throat clearing. Work on cough suppression with the above recommended suppressants.  Use sugar free hard candies or non-menthol cough drops during this time to soothe your throat.  Warm tea with honey and lemon.   Chest x ray today   Give this list to your daughter to make sure your medications are situated   Follow up in 6-8 weeks after PFT with Dr. Jayme Cloud or NP. If symptoms do not improve or worsen, please contact office for sooner follow up or seek emergency care.    Upper airway cough syndrome See above. CXR to rule out superimposed infection. Cough control as necessary   Diastolic dysfunction Likely contributing factor to DOE. Euvolemic today. Follow up with cardiology  as scheduled.   Ductal carcinoma in situ (DCIS) of left breast Hx of left breast cancer. On anaztrozole. She did have recent mammogram and [place] with some asymmetry, which is likely benign; however, she will have closer follow up with repeat imaging in 6 months. Follow up with oncology as scheduled.    I spent 42 minutes of dedicated to the care of this patient on the date of this encounter to include  pre-visit review of records, face-to-face time with the patient discussing conditions above, post visit ordering of testing, clinical documentation with the electronic health record, making appropriate referrals as documented, and communicating necessary findings to members of the patients care team.  Noemi Chapel, NP 11/22/2022  Pt aware and understands NP's role.

## 2022-11-20 NOTE — Patient Instructions (Addendum)
-  Start using Symbicort 2 puffs Twice daily. Brush tongue and rinse mouth afterwards.  -Albuterol inhaler 2 puffs every 6 hours as needed for shortness of breath or wheezing. Notify if symptoms persist despite rescue inhaler/neb use. This is your rescue inhaler and should be used if you are having trouble with your breathing or wheezing You should not be using any other inhalers besides these two   Continue singulair (montelukast) 1 tab At bedtime  Continue benzonatate 1 capsule Three times a day as needed for cough  -Stop omeprazole (prilosec). Start pantoprazole 40 mg daily about 30 minutes before breakfast for reflux/heartburn  -Start flonase nasal spray 2 sprays each nostril daily in morning   Upper airway cough syndrome: Suppress your cough to allow your larynx (voice box) to heal.  Limit talking for the next few days. Avoid throat clearing. Work on cough suppression with the above recommended suppressants.  Use sugar free hard candies or non-menthol cough drops during this time to soothe your throat.  Warm tea with honey and lemon.   Chest x ray today   Give this list to your daughter to make sure your medications are situated   Follow up in 6-8 weeks after PFT with Dr. Jayme Cloud or NP. If symptoms do not improve or worsen, please contact office for sooner follow up or seek emergency care.

## 2022-11-22 ENCOUNTER — Encounter: Payer: Self-pay | Admitting: Nurse Practitioner

## 2022-11-22 DIAGNOSIS — R058 Other specified cough: Secondary | ICD-10-CM | POA: Insufficient documentation

## 2022-11-22 NOTE — Assessment & Plan Note (Signed)
See above. CXR to rule out superimposed infection. Cough control as necessary

## 2022-11-22 NOTE — Assessment & Plan Note (Signed)
Likely contributing factor to DOE. Euvolemic today. Follow up with cardiology as scheduled.

## 2022-11-22 NOTE — Progress Notes (Signed)
Agree with the details of the visit as noted by Katherine Cobb, NP. ° °C. Laura Yasin Ducat, MD °Advanced Bronchoscopy °[facility] ° °

## 2022-11-22 NOTE — Assessment & Plan Note (Signed)
Stable on current regimen. FeNO nl and lung exam clear. She still needs PFTs to evaluate for underlying smoking related obstructive lung disease/assess lung function. We had a lengthy discussion surrounding asthma management and inhaler use. I have sent in albuterol for rescue. She will need this in addition to her Symbicort. Advised her to dispose of the Advair. She verbalized understanding. Action plan in place. She has a component of upper airway irritation secondary to GERD and postnasal drainage which is largely contributing to her daily cough. Will change her PPI to pantoprazole. Add on intranasal steroid. Reassess at follow up.   Patient Instructions  -Start using Symbicort 2 puffs Twice daily. Brush tongue and rinse mouth afterwards.  -Albuterol inhaler 2 puffs every 6 hours as needed for shortness of breath or wheezing. Notify if symptoms persist despite rescue inhaler/neb use. This is your rescue inhaler and should be used if you are having trouble with your breathing or wheezing You should not be using any other inhalers besides these two   Continue singulair (montelukast) 1 tab At bedtime  Continue benzonatate 1 capsule Three times a day as needed for cough  -Stop omeprazole (prilosec). Start pantoprazole 40 mg daily about 30 minutes before breakfast for reflux/heartburn  -Start flonase nasal spray 2 sprays each nostril daily in morning   Upper airway cough syndrome: Suppress your cough to allow your larynx (voice box) to heal.  Limit talking for the next few days. Avoid throat clearing. Work on cough suppression with the above recommended suppressants.  Use sugar free hard candies or non-menthol cough drops during this time to soothe your throat.  Warm tea with honey and lemon.   Chest x ray today   Give this list to your daughter to make sure your medications are situated   Follow up in 6-8 weeks after PFT with Dr. Jayme Cloud or NP. If symptoms do not improve or worsen, please  contact office for sooner follow up or seek emergency care.

## 2022-11-22 NOTE — Assessment & Plan Note (Signed)
Hx of left breast cancer. On anaztrozole. She did have recent mammogram and [place] with some asymmetry, which is likely benign; however, she will have closer follow up with repeat imaging in 6 months. Follow up with oncology as scheduled.

## 2022-12-11 ENCOUNTER — Ambulatory Visit: Payer: 59

## 2022-12-17 ENCOUNTER — Telehealth: Payer: Self-pay

## 2022-12-17 NOTE — Telephone Encounter (Signed)
Patient states she is having pain in her breast. Patient states she is having this pain when she lifts her arms. She states she is also having pain when she touches the breast. Patient states she did get a mammogram done on 10/23/22 which was stated to her that she isn't getting blood function in that breast and it may have to be removed also. Patient is worried about this pain.

## 2022-12-18 IMAGING — US US BREAST*L* LIMITED INC AXILLA
1 series · 8 of 8 positions shown · non-contrast
Comparison: 01/06/2020 from [HOSPITAL] [HOSPITAL] [facility],
[facility] [HOSPITAL].

CLINICAL DATA: 64-year-old with a personal history of malignant
RIGHT mastectomy in 6882, presenting with an approximate 1 month
history of diffuse LEFT breast pain.

Family history of breast cancer in a maternal aunt and in her
daughter.
EXAM:
DIGITAL DIAGNOSTIC UNILATERAL LEFT MAMMOGRAM WITH TOMOSYNTHESIS AND
CAD; ULTRASOUND LEFT BREAST LIMITED
TECHNIQUE: Left digital diagnostic mammography and breast tomosynthesis was
performed. The images were evaluated with computer-aided detection.;
Targeted ultrasound examination of the left breast was performed

[Series 1: us breast*left* limited inc axilla · 0.05mm/px · 8 of 8 slices shown]
[im 1/8]
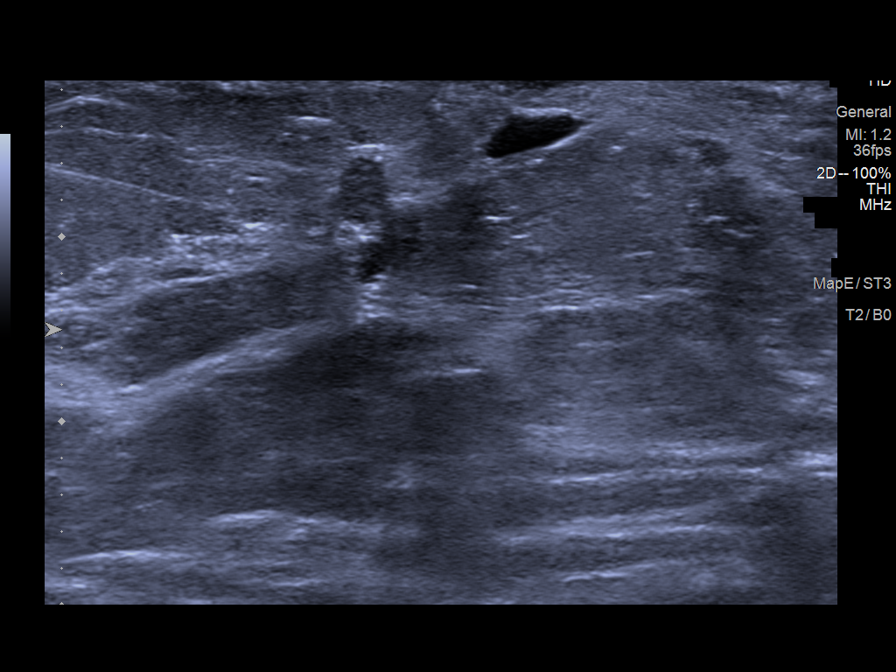
[im 2/8]
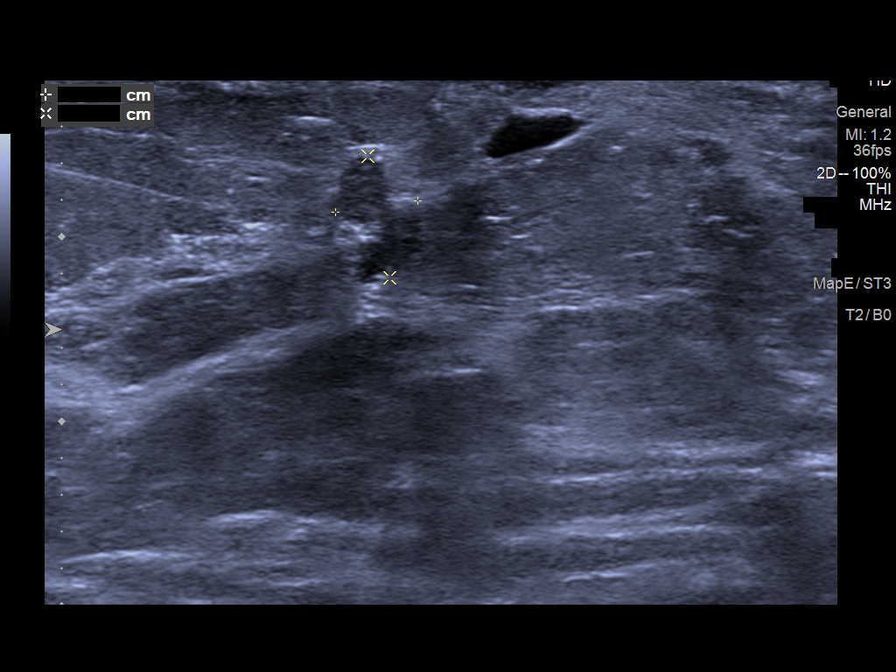
[im 3/8]
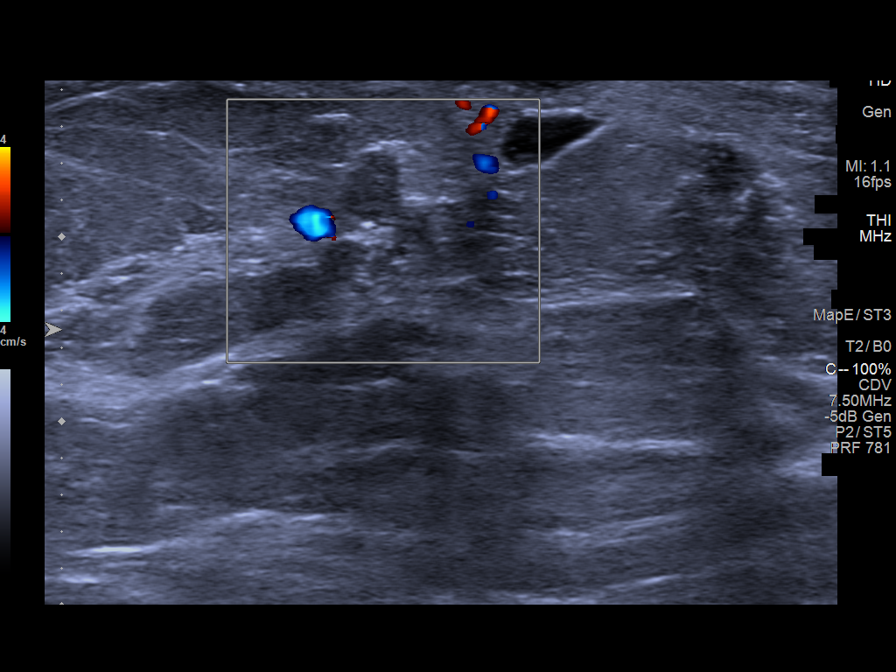
[im 4/8]
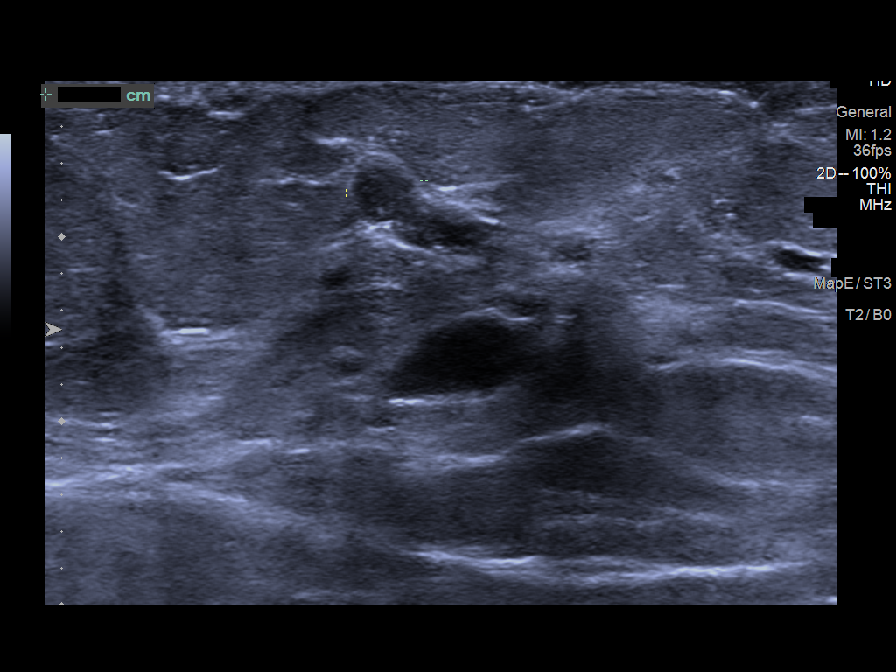
[im 5/8]
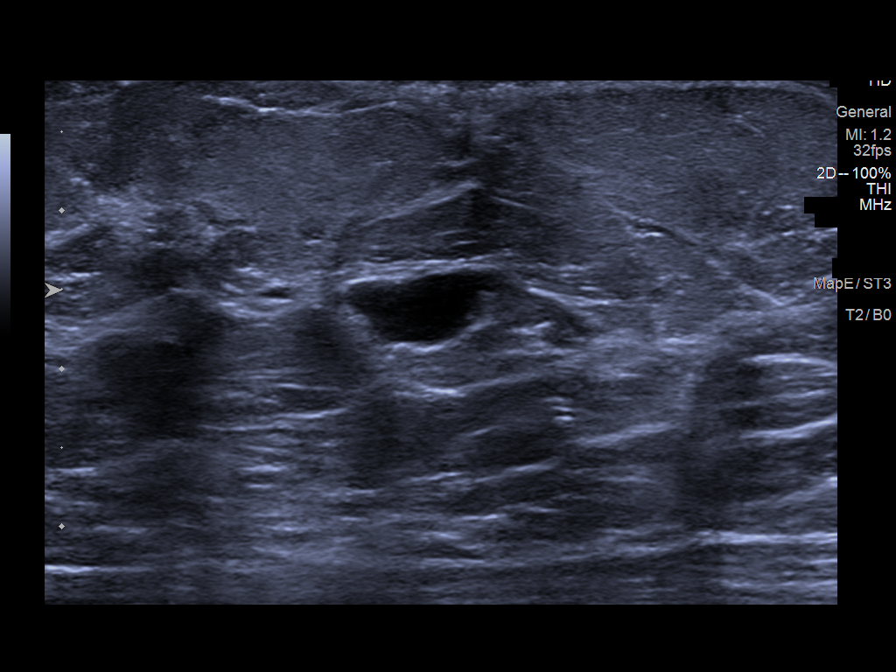
[im 6/8]
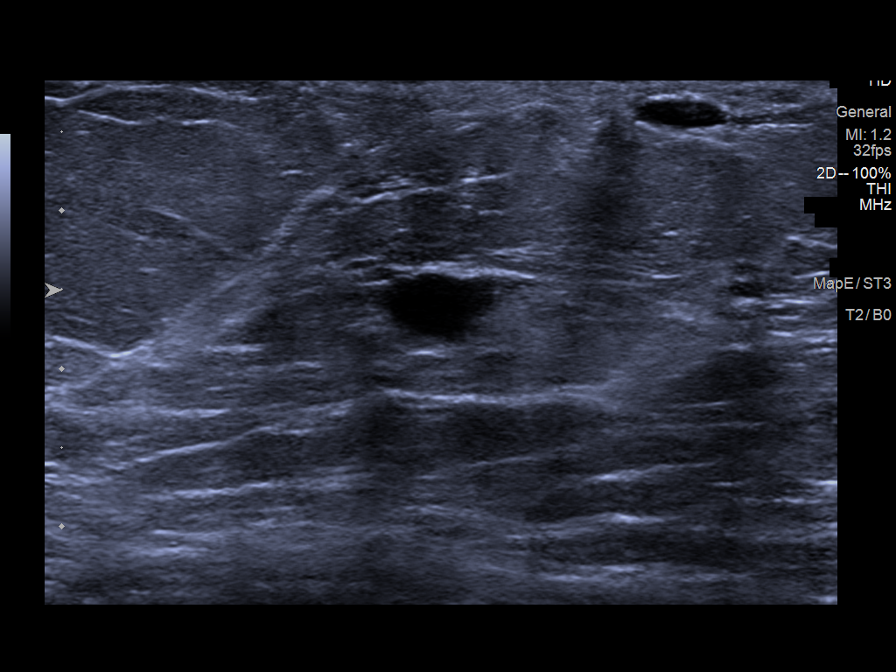
[im 7/8]
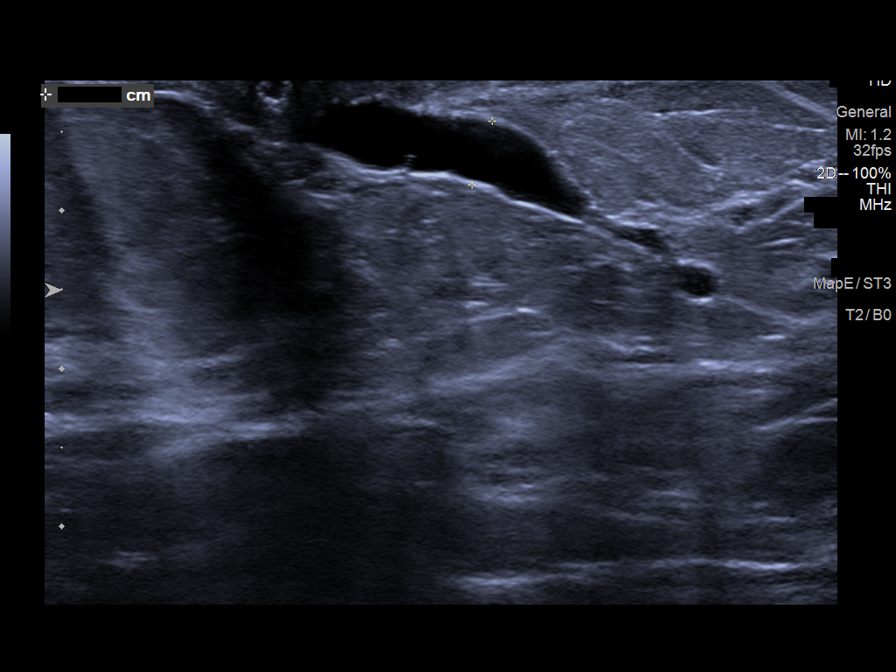
[im 8/8]
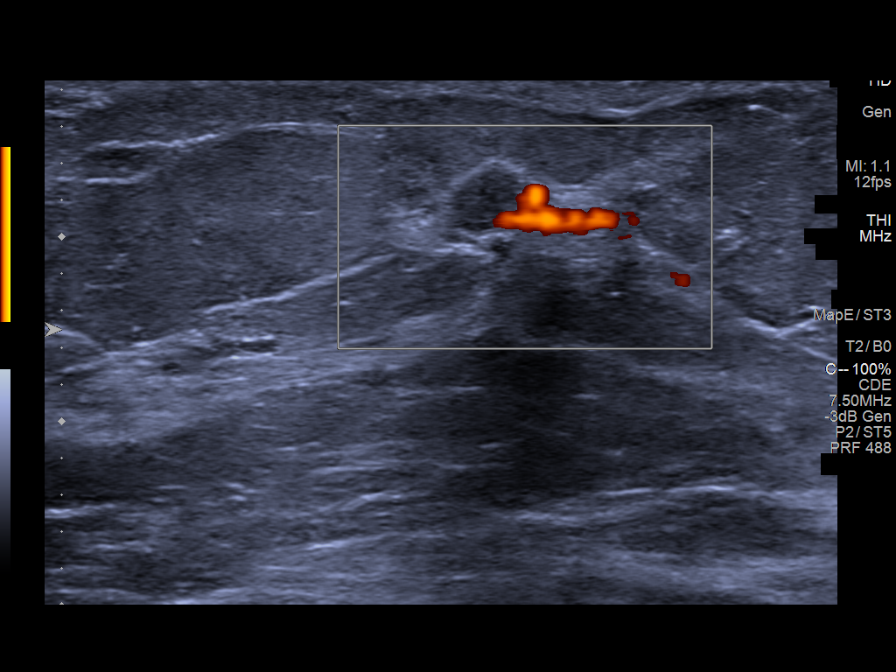

[8 of 8 positions shown; findings below may reference images not displayed]

ACR Breast Density Category b: There are scattered areas of
fibroglandular density.
FINDINGS: Full field CC and MLO views were obtained.

Circumscribed isodense mass in the retroareolar location at anterior
measuring approximately 1 cm without associated architectural
distortion or suspicious calcifications. No suspicious findings
elsewhere.

Targeted ultrasound is performed, demonstrating duct ectasia in the
subareolar location. Within 1 of the mildly dilated ducts at the 9
o'clock subareolar location is hypoechoic material spanning
approximately 7 mm without evidence of internal color Doppler flow.
A focally dilated duct extending over an approximate 1 cm length at
the 9 o'clock subareolar location accounts for the screening
mammographic finding.
IMPRESSION: Benign duct ectasia with what is likely hypoechoic debris within a
mildly dilated duct at the 9 o'clock subareolar location (given the
absence of internal blood flow).

RECOMMENDATION:
LEFT breast ultrasound in 6 months.

I have discussed the findings and recommendations with the patient.
If applicable, a reminder letter will be sent to the patient
regarding the next appointment.

BI-RADS CATEGORY  3: Probably benign.

## 2022-12-25 ENCOUNTER — Ambulatory Visit
Admission: RE | Admit: 2022-12-25 | Discharge: 2022-12-25 | Disposition: A | Discharge status: Home / Self Care | Payer: 59 | Source: Ambulatory Visit | Attending: Nurse Practitioner | Admitting: Nurse Practitioner

## 2022-12-25 ENCOUNTER — Ambulatory Visit
Admission: RE | Admit: 2022-12-25 | Discharge: 2022-12-25 | Disposition: A | Discharge status: Home / Self Care | Payer: 59 | Source: Ambulatory Visit | Attending: Nurse Practitioner | Admitting: *Deleted

## 2022-12-25 ENCOUNTER — Encounter: Payer: Self-pay | Admitting: Nurse Practitioner

## 2022-12-25 ENCOUNTER — Inpatient Hospital Stay: Payer: 59 | Attending: Internal Medicine | Admitting: Nurse Practitioner

## 2022-12-25 VITALS — BP 155/70 | HR 89 | Temp 98.6°F | Wt 180.0 lb

## 2022-12-25 DIAGNOSIS — Z9221 Personal history of antineoplastic chemotherapy: Secondary | ICD-10-CM | POA: Insufficient documentation

## 2022-12-25 DIAGNOSIS — M25512 Pain in left shoulder: Secondary | ICD-10-CM

## 2022-12-25 DIAGNOSIS — I10 Essential (primary) hypertension: Secondary | ICD-10-CM | POA: Insufficient documentation

## 2022-12-25 DIAGNOSIS — Z79811 Long term (current) use of aromatase inhibitors: Secondary | ICD-10-CM | POA: Insufficient documentation

## 2022-12-25 DIAGNOSIS — Z7982 Long term (current) use of aspirin: Secondary | ICD-10-CM | POA: Diagnosis not present

## 2022-12-25 DIAGNOSIS — M8580 Other specified disorders of bone density and structure, unspecified site: Secondary | ICD-10-CM | POA: Diagnosis not present

## 2022-12-25 DIAGNOSIS — Z7984 Long term (current) use of oral hypoglycemic drugs: Secondary | ICD-10-CM | POA: Insufficient documentation

## 2022-12-25 DIAGNOSIS — N644 Mastodynia: Secondary | ICD-10-CM | POA: Insufficient documentation

## 2022-12-25 DIAGNOSIS — E119 Type 2 diabetes mellitus without complications: Secondary | ICD-10-CM | POA: Insufficient documentation

## 2022-12-25 DIAGNOSIS — Z170 Estrogen receptor positive status [ER+]: Secondary | ICD-10-CM | POA: Diagnosis not present

## 2022-12-25 DIAGNOSIS — Z87891 Personal history of nicotine dependence: Secondary | ICD-10-CM | POA: Diagnosis not present

## 2022-12-25 DIAGNOSIS — Z803 Family history of malignant neoplasm of breast: Secondary | ICD-10-CM | POA: Insufficient documentation

## 2022-12-25 DIAGNOSIS — Z800 Family history of malignant neoplasm of digestive organs: Secondary | ICD-10-CM | POA: Diagnosis not present

## 2022-12-25 DIAGNOSIS — Z7951 Long term (current) use of inhaled steroids: Secondary | ICD-10-CM | POA: Insufficient documentation

## 2022-12-25 DIAGNOSIS — Z9012 Acquired absence of left breast and nipple: Secondary | ICD-10-CM | POA: Diagnosis not present

## 2022-12-25 DIAGNOSIS — Z8719 Personal history of other diseases of the digestive system: Secondary | ICD-10-CM | POA: Diagnosis not present

## 2022-12-25 DIAGNOSIS — D0512 Intraductal carcinoma in situ of left breast: Secondary | ICD-10-CM | POA: Diagnosis present

## 2022-12-25 DIAGNOSIS — Z794 Long term (current) use of insulin: Secondary | ICD-10-CM | POA: Insufficient documentation

## 2022-12-25 DIAGNOSIS — Z79899 Other long term (current) drug therapy: Secondary | ICD-10-CM | POA: Insufficient documentation

## 2022-12-25 NOTE — Progress Notes (Signed)
Symptom Management Clinic  [facility] A Department of the [place]. [facility] [address], [address] [place], [place] [address] [phone] (phone) [phone] (fax)  Patient Care Team: Al Corpus, MD as PCP - General (Obstetrics and Gynecology) Earna Coder, MD as Consulting Physician (Oncology) [name], MD as Consulting Physician (Radiation Oncology) Salena Saner, MD as Consulting Physician (Pulmonary Disease)   Name of the patient: Gail Stevenson  [id number]  May 18, 1955   Date of visit: 12/25/22  Diagnosis- Left breast dcis  Chief complaint/ Reason for visit- left breast pain  Heme/Onc history:  Oncology History Overview Note   # DCIS LEFT [MAY 2023- s/p WBRT [Dr.Crystal.]; ER POSITIVE; JUNE 2023-start anastrozole.  # hx of breast cancer s/p mastectomy [[facility]]  # THROMBOCYTOSIS [NOv 2021 605 -platelets- ; Hb;10.5 white count-WNL]; MARCH 2022-JAK-2NEG; CALR-NEG.BCR-ABL-NEGATIVE.     Ductal carcinoma in situ (DCIS) of left breast  04/15/2015 Initial Diagnosis   Ductal carcinoma in situ (DCIS) of left breast   05/08/2021 Cancer Staging   Staging form: Breast, AJCC 7th Edition - Clinical stage from 05/08/2021: Stage 0 (Tis (DCIS), N0, M0) - Signed by Jeralyn Ruths, MD on 05/08/2021     Interval history-  patient presents for complaints of left breast pain. Pain started after her mammogram and has persisted. Complains of her whole breast being sore and tender. Also has left shoulder pain which radiates down her chest and her left arm. She feels that pain is new and hasn't experienced in the past. Unsure if she has nipple changes. No skin changes. She says that during her mammogram she was told there are blood vessel changes and she may lose her breast.   Review of systems- Review of Systems  Constitutional:  Positive for malaise/fatigue. Negative for chills, fever and weight loss.   Respiratory:  Negative for shortness of breath.   Cardiovascular:  Negative for chest pain.  Musculoskeletal:  Positive for joint pain. Negative for myalgias.  Neurological:  Negative for tingling and weakness.     Current treatment- anastrozole  Allergies  Allergen Reactions   Metformin Other (See Comments) and Swelling    Unable to walk   Patanol [Olopatadine]     Unknown reaction    Paroxetine Hcl Rash    Past Medical History:  Diagnosis Date   Asthma    Breast cancer (HCC)    Diabetes mellitus without complication (HCC)    Type 2   Diastolic dysfunction    Diverticulitis    Esophageal varices without bleeding (HCC)    Fothergill's neuralgia    Hemorrhoids    Hepatitis C    History of breast cancer 2003   right side total mastectomy- DCIS   Hypertension    Leg swelling    Leukocytosis    LVH (left ventricular hypertrophy)    Morbid obesity (HCC)    Osteopenia    Personal history of chemotherapy    Personal history of radiation therapy    Sepsis (HCC)    SIRS (systemic inflammatory response syndrome) (HCC)    Thrombocytosis    Trigeminal neuralgia     Past Surgical History:  Procedure Laterality Date   BREAST BIOPSY Left 03/14/2021   [place] bx ribbon marker,differential diagnosisi of intraductal papilloma with ADH or DCIS   BREAST LUMPECTOMY WITH RADIOFREQUENCY TAG IDENTIFICATION Left 04/27/2021   Procedure: BREAST LUMPECTOMY WITH RADIOFREQUENCY TAG IDENTIFICATION;  Surgeon: Henrene Dodge, MD;  Location: [facility] ORS;  Service:  General;  Laterality: Left;   BREAST SURGERY Right 2003   total mastectomy   COLONOSCOPY  2019   LAPAROTOMY N/A 01/25/2019   Procedure: EXPLORATORY LAPAROTOMY, ILEOCECECTOMY;  Surgeon: Violeta Gelinas, MD;  Location: [facility] OR;  Service: General;  Laterality: N/A;   MASTECTOMY Right 2003   total   UPPER GI ENDOSCOPY      Social History   Socioeconomic History   Marital status: Divorced    Spouse name: Not on file   Number of children: 1    Years of education: Not on file   Highest education level: Not on file  Occupational History   Not on file  Tobacco Use   Smoking status: Former    Current packs/day: 0.00    Average packs/day: 0.5 packs/day for 10.0 years (5.0 ttl pk-yrs)    Types: Cigarettes    Start date: 05/18/1982    Quit date: 05/17/1992    Years since quitting: 30.6   Smokeless tobacco: Never  Vaping Use   Vaping status: Never Used  Substance and Sexual Activity   Alcohol use: Not Currently    Comment: Occasional beer   Drug use: No   Sexual activity: Yes  Other Topics Concern   Not on file  Social History Narrative   Lives with deceased daughter's boyfriend and ex-husband in [place]; Daughter assists in medical care. Hx of smoking; recovering alcohol. Used to work-house keeping/construction.    Social Determinants of Health   Financial Resource Strain: High Risk (11/06/2021)   Overall Financial Resource Strain (CARDIA)    Difficulty of Paying Living Expenses: Hard  Food Insecurity: No Food Insecurity (06/14/2022)   Hunger Vital Sign    Worried About Running Out of Food in the Last Year: Never true    Ran Out of Food in the Last Year: Never true  Transportation Needs: No Transportation Needs (06/14/2022)   PRAPARE - Administrator, Civil Service (Medical): No    Lack of Transportation (Non-Medical): No  Recent Concern: Transportation Needs - Unmet Transportation Needs (05/01/2022)   PRAPARE - Transportation    Lack of Transportation (Medical): Yes    Lack of Transportation (Non-Medical): Yes  Physical Activity: Insufficiently Active (11/06/2021)   Exercise Vital Sign    Days of Exercise per Week: 3 days    Minutes of Exercise per Session: 30 min  Stress: Stress Concern Present (11/06/2021)   [employer] of Occupational Health - Occupational Stress Questionnaire    Feeling of Stress : To some extent  Social Connections: Socially Integrated (11/06/2021)   Social Connection and  Isolation Panel [NHANES]    Frequency of Communication with Friends and Family: More than three times a week    Frequency of Social Gatherings with Friends and Family: More than three times a week    Attends Religious Services: More than 4 times per year    Active Member of [employer] or Organizations: Yes    Attends Banker Meetings: 1 to 4 times per year    Marital Status: Married  Catering manager Violence: Not At Risk (06/14/2022)   Humiliation, Afraid, Rape, and Kick questionnaire    Fear of Current or Ex-Partner: No    Emotionally Abused: No    Physically Abused: No    Sexually Abused: No    Family History  Problem Relation Age of Onset   Gastric cancer Father 22   Breast cancer Maternal Aunt    Breast cancer Daughter 8   Stomach cancer Other  Hypertension Neg Hx    Stroke Neg Hx      Current Outpatient Medications:    acetaminophen (TYLENOL) 500 MG tablet, Take 2 tablets (1,000 mg total) by mouth every 6 (six) hours as needed for mild pain., Disp: , Rfl:    anastrozole (ARIMIDEX) 1 MG tablet, Take 1 tablet (1 mg total) by mouth daily., Disp: 90 tablet, Rfl: 3   aspirin EC 81 MG tablet, Take 81 mg by mouth daily., Disp: , Rfl:    atorvastatin (LIPITOR) 40 MG tablet, Take 40 mg by mouth at bedtime. , Disp: , Rfl:    benzonatate (TESSALON) 100 MG capsule, Take 2 capsules (200 mg total) by mouth 3 (three) times daily as needed for cough., Disp: 30 capsule, Rfl: 0   carvedilol (COREG) 12.5 MG tablet, Take 12.5 mg by mouth 2 (two) times daily., Disp: , Rfl:    clotrimazole (LOTRIMIN AF) 1 % cream, Apply 1 Application topically 2 (two) times daily., Disp: 30 g, Rfl: 0   empagliflozin (JARDIANCE) 10 MG TABS tablet, Take 10 mg by mouth daily., Disp: , Rfl:    ENTRESTO 24-26 MG, Take 1 tablet by mouth 2 (two) times daily., Disp: , Rfl:    fluconazole (DIFLUCAN) 40 MG/ML suspension, Take 100 mg by mouth daily., Disp: , Rfl:    fluticasone (FLONASE) 50 MCG/ACT nasal spray,  Place 2 sprays into both nostrils daily., Disp: 18.2 mL, Rfl: 2   gabapentin (NEURONTIN) 100 MG capsule, Take 100 mg by mouth 2 (two) times daily., Disp: , Rfl:    LANTUS SOLOSTAR 100 UNIT/ML Solostar Pen, Inject 56 Units into the skin at bedtime., Disp: , Rfl:    loperamide (IMODIUM) 2 MG capsule, Take 2 mg by mouth 4 (four) times daily as needed for diarrhea or loose stools., Disp: , Rfl:    magnesium oxide (MAG-OX) 400 MG tablet, Take 800 mg by mouth 2 (two) times daily., Disp: , Rfl:    montelukast (SINGULAIR) 10 MG tablet, Take 10 mg by mouth at bedtime., Disp: , Rfl:    Multiple Vitamin (MULTIVITAMIN WITH MINERALS) TABS tablet, Take 4 tablets by mouth daily., Disp: , Rfl:    NIFEdipine (PROCARDIA-XL/NIFEDICAL-XL) 30 MG 24 hr tablet, Take 30 mg by mouth daily., Disp: , Rfl:    pantoprazole (PROTONIX) 40 MG tablet, Take 1 tablet (40 mg total) by mouth daily., Disp: 30 tablet, Rfl: 5   PROAIR HFA 108 (90 Base) MCG/ACT inhaler, Inhale 2 puffs into the lungs every 6 (six) hours as needed for wheezing or shortness of breath., Disp: 8 g, Rfl: 2   SYMBICORT 160-4.5 MCG/ACT inhaler, Inhale 1 puff into the lungs daily., Disp: , Rfl:    potassium chloride (KLOR-CON) 20 MEQ packet, Take 20 mEq by mouth 2 (two) times daily for 5 days., Disp: 10 packet, Rfl: 0  Physical exam:  Vitals:   12/25/22 1035  BP: (!) 155/70  Pulse: 89  Temp: 98.6 F (37 C)  TempSrc: Tympanic  SpO2: 99%  Weight: 180 lb (81.6 kg)   Physical Exam Constitutional:      Appearance: She is not ill-appearing.  Chest:  Breasts:    Right: Absent.     Left: Tenderness present. No mass, nipple discharge or skin change.     Comments: Flat nipple. Periareolar post surgical changes. Diffuse tenderness of whole breast.  Musculoskeletal:     Left shoulder: No deformity, effusion or crepitus. Decreased range of motion.  Lymphadenopathy:     Upper Body:  Left upper body: No supraclavicular adenopathy.  Skin:    Coloration:  Skin is not pale.  Neurological:     Mental Status: She is alert and oriented to person, place, and time.  Psychiatric:        Mood and Affect: Mood normal.        Behavior: Behavior normal.     No results found.  Assessment and plan- Patient is a 67 y.o. female who presents to clinic for complaints of   Left breast pain- etiology unclear- question lymphedema? Recent mammogram and ultrasound from 10/23/22 was reviewed and no definitive findings of malignancy. Breast is diffusely tender and no definitive masses identified. Recommend evaluation with Marisue Humble. If pain ongoing, recommend reevaluation.  Left shoulder pain- with movement. Question OA? Will get xrays today. Consider referral to ortho.  Right breast cancer- s/p mastectomy in 2003. Asymptomatic.  Left breast DCIS- ER positive. S/p lumpectomy & radiation. On anastrozole. 10/23/22 mammogram and ultrasound were benign. Plan is to repeat in March 2025. Imaging with Dr. Aleen Campi.    Disposition:  Left shoulder xray Ref to maureen for lymphedema eval F/u with oncology as scheduled. Return to clinic sooner fi symptoms do not improve or worsen- la  Visit Diagnosis 1. Breast pain, left   2. Acute pain of left shoulder    Patient expressed understanding and was in agreement with this plan. She also understands that She can call clinic at any time with any questions, concerns, or complaints.   Thank you for allowing me to participate in the care of this pleasant patient.   [name], DNP, AGNP-C, AOCNP [facility] [phone]  CC: Dr Donneta Romberg, Dr Aleen Campi

## 2023-01-02 ENCOUNTER — Inpatient Hospital Stay: Payer: 59 | Admitting: Occupational Therapy

## 2023-01-02 DIAGNOSIS — N644 Mastodynia: Secondary | ICD-10-CM
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
## 2023-01-08 ENCOUNTER — Encounter: Payer: Self-pay | Admitting: Pulmonary Disease

## 2023-01-08 ENCOUNTER — Ambulatory Visit: Payer: 59 | Admitting: Pulmonary Disease

## 2023-01-08 VITALS — BP 122/82 | HR 82 | Temp 97.5°F | Ht 61.0 in | Wt 179.2 lb

## 2023-01-08 DIAGNOSIS — I5189 Other ill-defined heart diseases: Secondary | ICD-10-CM

## 2023-01-08 DIAGNOSIS — D0512 Intraductal carcinoma in situ of left breast: Secondary | ICD-10-CM | POA: Diagnosis not present

## 2023-01-08 DIAGNOSIS — J45998 Other asthma: Secondary | ICD-10-CM

## 2023-01-08 NOTE — Patient Instructions (Signed)
VISIT SUMMARY:  You came in today for a follow-up visit regarding your asthma. You reported that your breathing has been pretty good, and you have been using your inhalers as needed. Your recent chest x-ray was normal, and you have received all your recommended vaccinations.  YOUR PLAN:  -ASTHMA: Asthma is a condition where your airways narrow and swell, which can make it hard to breathe. Your asthma is well-controlled. You should continue using Symbicort, an anti-inflammatory inhaler, two puffs twice a day to help prevent flare-ups. Use ProAir, your rescue inhaler, as needed for sudden symptoms.  -GENERAL HEALTH MAINTENANCE: You have received your flu shot, pneumonia vaccine, and COVID-19 vaccine. No additional vaccinations are needed at this time.  INSTRUCTIONS:  Please follow up in four months for your next asthma check-up.
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
## 2023-01-16 ENCOUNTER — Encounter: Payer: Self-pay | Admitting: Occupational Therapy

## 2023-01-16 ENCOUNTER — Inpatient Hospital Stay: Payer: 59 | Attending: Internal Medicine | Admitting: Occupational Therapy

## 2023-01-25 ENCOUNTER — Emergency Department: Payer: 59

## 2023-01-25 ENCOUNTER — Other Ambulatory Visit: Payer: Self-pay

## 2023-01-25 ENCOUNTER — Emergency Department
Admission: EM | Admit: 2023-01-25 | Discharge: 2023-01-25 | Disposition: A | Discharge status: Home / Self Care | Payer: 59 | Attending: Emergency Medicine | Admitting: Emergency Medicine

## 2023-01-25 DIAGNOSIS — R519 Headache, unspecified: Secondary | ICD-10-CM | POA: Insufficient documentation

## 2023-01-25 DIAGNOSIS — Z853 Personal history of malignant neoplasm of breast: Secondary | ICD-10-CM | POA: Insufficient documentation

## 2023-01-25 DIAGNOSIS — I700 Atherosclerosis of aorta: Secondary | ICD-10-CM | POA: Insufficient documentation

## 2023-01-25 DIAGNOSIS — K5730 Diverticulosis of large intestine without perforation or abscess without bleeding: Secondary | ICD-10-CM | POA: Diagnosis not present

## 2023-01-25 DIAGNOSIS — I2510 Atherosclerotic heart disease of native coronary artery without angina pectoris: Secondary | ICD-10-CM | POA: Diagnosis not present

## 2023-01-25 DIAGNOSIS — F419 Anxiety disorder, unspecified: Secondary | ICD-10-CM | POA: Diagnosis not present

## 2023-01-25 DIAGNOSIS — R079 Chest pain, unspecified: Secondary | ICD-10-CM | POA: Diagnosis present

## 2023-01-25 DIAGNOSIS — I10 Essential (primary) hypertension: Secondary | ICD-10-CM | POA: Diagnosis not present

## 2023-01-25 DIAGNOSIS — R0789 Other chest pain: Secondary | ICD-10-CM | POA: Diagnosis not present

## 2023-01-25 DIAGNOSIS — D649 Anemia, unspecified: Secondary | ICD-10-CM | POA: Diagnosis not present

## 2023-01-25 LAB — CBC
HCT: 36.6 % (ref 36.0–46.0)
Hemoglobin: 11.8 g/dL — ABNORMAL LOW (ref 12.0–15.0)
MCH: 30.1 pg (ref 26.0–34.0)
MCHC: 32.2 g/dL (ref 30.0–36.0)
MCV: 93.4 fL (ref 80.0–100.0)
Platelets: 311 10*3/uL (ref 150–400)
RBC: 3.92 MIL/uL (ref 3.87–5.11)
RDW: 12.5 % (ref 11.5–15.5)
WBC: 8.5 10*3/uL (ref 4.0–10.5)
nRBC: 0 % (ref 0.0–0.2)

## 2023-01-25 LAB — HEPATIC FUNCTION PANEL
ALT: 18 U/L (ref 0–44)
AST: 21 U/L (ref 15–41)
Albumin: 3.7 g/dL (ref 3.5–5.0)
Alkaline Phosphatase: 86 U/L (ref 38–126)
Bilirubin, Direct: <0.1 mg/dL (ref 0.0–0.2)
Total Bilirubin: 0.4 mg/dL (ref <1.2)
Total Protein: 7.1 g/dL (ref 6.5–8.1)

## 2023-01-25 LAB — BASIC METABOLIC PANEL
Anion gap: 10 (ref 5–15)
BUN: 13 mg/dL (ref 8–23)
CO2: 24 mmol/L (ref 22–32)
Calcium: 8.5 mg/dL — ABNORMAL LOW (ref 8.9–10.3)
Chloride: 100 mmol/L (ref 98–111)
Creatinine, Ser: 1.29 mg/dL — ABNORMAL HIGH (ref 0.44–1.00)
GFR, Estimated: 45 mL/min — ABNORMAL LOW (ref >60)
Glucose, Bld: 156 mg/dL — ABNORMAL HIGH (ref 70–99)
Potassium: 3.5 mmol/L (ref 3.5–5.1)
Sodium: 134 mmol/L — ABNORMAL LOW (ref 135–145)

## 2023-01-25 LAB — LIPASE, BLOOD: Lipase: 23 U/L (ref 11–51)

## 2023-01-25 LAB — TROPONIN I (HIGH SENSITIVITY)
Troponin I (High Sensitivity): 9 ng/L (ref <18)
Troponin I (High Sensitivity): 8 ng/L (ref <18)

## 2023-01-25 MED ORDER — IOHEXOL 350 MG/ML SOLN
100.0000 mL | Freq: Once | INTRAVENOUS | Status: AC | PRN
  Administered 2023-01-25: 80 mL via INTRAVENOUS

## 2023-01-25 MED ORDER — LORAZEPAM 2 MG/ML IJ SOLN
0.5000 mg | Freq: Once | INTRAMUSCULAR | Status: AC
  Administered 2023-01-25: 0.5 mg via INTRAVENOUS
  Filled 2023-01-25: qty 1

## 2023-01-25 NOTE — ED Notes (Signed)
 Delay in triage and EKG: pt to restroom
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
## 2023-01-25 NOTE — ED Provider Notes (Signed)
Care of this patient assumed from prior physician at 1500 pending CTA, repeat troponin, and disposition. Please see prior physician note for further details.  Brief this is a 67 year old female who presented with chest pain in the setting of holding on her anxiety medications.  Improved here after some Ativan, but did report some ongoing chest pain and abdominal pain.  Initial labs reassuring including negative troponin.  Signed out to me pending CT dissection protocol and repeat troponin.  Repeat troponin returned within normal limits. CT dissection protocol without findings of dissection or other acute findings.  On reevaluation, patient feels much improved.  She is eager to be discharged home.  Do think this is reasonable.  Strict return precautions provided.  Patient discharged in stable condition.   Trinna Post, MD 01/25/23 [phone]

## 2023-01-25 NOTE — ED Triage Notes (Signed)
Pt to ED for headache, tremors, nausea, chest pain, anxiety. Had colonoscopy yesterday.  Pt appears very anxious in triage with pressured speech, tachypnea, shaking body intermittently.  Husband died recently, daughters 2nd anniversary of death.

## 2023-01-25 NOTE — ED Notes (Signed)
Pt ambulatory to bathroom at this time. Pt needed standby assistance at this time

## 2023-01-25 NOTE — Discharge Instructions (Signed)
Your testing today was reassuring.  Follow-up with your primary care doctor for further evaluation.  Return to the ER for new or worsening symptoms.

## 2023-01-27 ENCOUNTER — Encounter: Payer: Self-pay | Admitting: Internal Medicine

## 2023-01-28 ENCOUNTER — Other Ambulatory Visit: Payer: Self-pay | Admitting: *Deleted

## 2023-01-28 MED ORDER — ANASTROZOLE 1 MG PO TABS: 1.0000 mg | ORAL_TABLET | Freq: Every day | ORAL | 3 refills | Status: DC

## 2023-01-31 ENCOUNTER — Other Ambulatory Visit: Payer: Self-pay | Admitting: Nurse Practitioner

## 2023-01-31 DIAGNOSIS — J4530 Mild persistent asthma, uncomplicated: Secondary | ICD-10-CM

## 2023-02-01 ENCOUNTER — Ambulatory Visit: Payer: 59 | Admitting: Cardiology

## 2023-02-01 NOTE — Telephone Encounter (Signed)
I do not see this rx in the pt's med list, nor does the lov have anything about the albuterol. Please advise.

## 2023-02-18 ENCOUNTER — Other Ambulatory Visit: Payer: Self-pay | Admitting: Nurse Practitioner

## 2023-02-18 DIAGNOSIS — R058 Other specified cough: Secondary | ICD-10-CM

## 2023-02-26 ENCOUNTER — Encounter: Payer: Self-pay | Admitting: Podiatry

## 2023-02-26 ENCOUNTER — Ambulatory Visit (INDEPENDENT_AMBULATORY_CARE_PROVIDER_SITE_OTHER): Payer: 59 | Admitting: Podiatry

## 2023-02-26 ENCOUNTER — Ambulatory Visit: Payer: 59

## 2023-02-26 ENCOUNTER — Ambulatory Visit (INDEPENDENT_AMBULATORY_CARE_PROVIDER_SITE_OTHER): Payer: 59

## 2023-02-26 VITALS — Ht 61.0 in | Wt 178.6 lb

## 2023-02-26 DIAGNOSIS — M79671 Pain in right foot: Secondary | ICD-10-CM

## 2023-02-26 DIAGNOSIS — M79672 Pain in left foot: Secondary | ICD-10-CM

## 2023-02-26 DIAGNOSIS — M7751 Other enthesopathy of right foot: Secondary | ICD-10-CM

## 2023-02-26 DIAGNOSIS — M7752 Other enthesopathy of left foot: Secondary | ICD-10-CM

## 2023-02-26 NOTE — Progress Notes (Signed)
 Subjective:  Patient ID: Gail Stevenson, female    DOB: [birth year],  MRN: [id number]  Chief Complaint  Patient presents with   Foot Pain    Pt is here due to bilateral foot and ankle pain, states it feels like she is walking on pins and needles.    68 y.o. female presents with the above complaint.  Patient presents with complaint bilateral ankle pain that has been going for quite some time is progressive gotten worse worse with ambulation worse with pressure she would like to discuss treatment options for it.  She has not seen and was prior to seeing me.  She is a diabetic.  Pain scale is 5 out of 10 dull aching nature they are getting swollen as well.   Review of Systems: Negative except as noted in the HPI. Denies N/V/F/Ch.  Past Medical History:  Diagnosis Date   Asthma    Breast cancer (HCC)    Diabetes mellitus without complication (HCC)    Type 2   Diastolic dysfunction    Diverticulitis    Esophageal varices without bleeding (HCC)    Fothergill's neuralgia    Hemorrhoids    Hepatitis C    History of breast cancer 2003   right side total mastectomy- DCIS   Hypertension    Leg swelling    Leukocytosis    LVH (left ventricular hypertrophy)    Morbid obesity (HCC)    Osteopenia    Personal history of chemotherapy    Personal history of radiation therapy    Sepsis (HCC)    SIRS (systemic inflammatory response syndrome) (HCC)    Thrombocytosis    Trigeminal neuralgia     Current Outpatient Medications:    acetaminophen  (TYLENOL ) 500 MG tablet, Take 2 tablets (1,000 mg total) by mouth every 6 (six) hours as needed for mild pain., Disp: , Rfl:    albuterol  (VENTOLIN  HFA) 108 (90 Base) MCG/ACT inhaler, TAKE 2 PUFFS BY MOUTH EVERY 6 HOURS AS NEEDED FOR WHEEZE OR SHORTNESS OF BREATH, Disp: 8.5 each, Rfl: 2   anastrozole  (ARIMIDEX ) 1 MG tablet, Take 1 tablet (1 mg total) by mouth daily., Disp: 90 tablet, Rfl: 3   aspirin  EC 81 MG tablet, Take 81 mg by mouth daily., Disp: ,  Rfl:    atorvastatin  (LIPITOR) 40 MG tablet, Take 40 mg by mouth at bedtime. , Disp: , Rfl:    benzonatate  (TESSALON ) 100 MG capsule, Take 2 capsules (200 mg total) by mouth 3 (three) times daily as needed for cough., Disp: 30 capsule, Rfl: 0   carvedilol  (COREG ) 12.5 MG tablet, Take 12.5 mg by mouth 2 (two) times daily., Disp: , Rfl:    clotrimazole  (LOTRIMIN  AF) 1 % cream, Apply 1 Application topically 2 (two) times daily., Disp: 30 g, Rfl: 0   empagliflozin  (JARDIANCE ) 10 MG TABS tablet, Take 10 mg by mouth daily., Disp: , Rfl:    ENTRESTO  24-26 MG, Take 1 tablet by mouth 2 (two) times daily., Disp: , Rfl:    fluconazole  (DIFLUCAN ) 40 MG/ML suspension, Take 100 mg by mouth daily., Disp: , Rfl:    fluticasone  (FLONASE ) 50 MCG/ACT nasal spray, SPRAY 2 SPRAYS INTO EACH NOSTRIL EVERY DAY, Disp: 48 mL, Rfl: 2   gabapentin  (NEURONTIN ) 100 MG capsule, Take 100 mg by mouth 2 (two) times daily., Disp: , Rfl:    LANTUS  SOLOSTAR 100 UNIT/ML Solostar Pen, Inject 56 Units into the skin at bedtime., Disp: , Rfl:    loperamide  (IMODIUM ) 2 MG capsule, Take  2 mg by mouth 4 (four) times daily as needed for diarrhea or loose stools., Disp: , Rfl:    magnesium  oxide (MAG-OX) 400 MG tablet, Take 800 mg by mouth 2 (two) times daily., Disp: , Rfl:    montelukast  (SINGULAIR ) 10 MG tablet, Take 10 mg by mouth at bedtime., Disp: , Rfl:    Multiple Vitamin (MULTIVITAMIN WITH MINERALS) TABS tablet, Take 4 tablets by mouth daily., Disp: , Rfl:    NIFEdipine  (PROCARDIA -XL/NIFEDICAL-XL) 30 MG 24 hr tablet, Take 30 mg by mouth daily., Disp: , Rfl:    ondansetron  (ZOFRAN -ODT) 4 MG disintegrating tablet, Take 1 tablet by mouth every 8 (eight) hours as needed., Disp: , Rfl:    pantoprazole  (PROTONIX ) 40 MG tablet, Take 1 tablet (40 mg total) by mouth daily., Disp: 30 tablet, Rfl: 5   sertraline  (ZOLOFT ) 20 MG/ML concentrated solution, Take 100 mg by mouth daily., Disp: , Rfl:    SYMBICORT  160-4.5 MCG/ACT inhaler, Inhale 1 puff  into the lungs daily., Disp: , Rfl:    potassium chloride  (KLOR-CON ) 20 MEQ packet, Take 20 mEq by mouth 2 (two) times daily for 5 days., Disp: 10 packet, Rfl: 0  Social History   Tobacco Use  Smoking Status Former   Current packs/day: 0.00   Average packs/day: 0.5 packs/day for 10.0 years (5.0 ttl pk-yrs)   Types: Cigarettes   Start date: 05/18/1982   Quit date: 05/17/1992   Years since quitting: 30.8  Smokeless Tobacco Never    Allergies  Allergen Reactions   Metformin Other (See Comments) and Swelling    Unable to walk   Patanol [Olopatadine]     Unknown reaction    Paroxetine Hcl Rash   Objective:  There were no vitals filed for this visit. Body mass index is 33.74 kg/m. Constitutional Well developed. Well nourished.  Vascular Dorsalis pedis pulses palpable bilaterally. Posterior tibial pulses palpable bilaterally. Capillary refill normal to all digits.  No cyanosis or clubbing noted. Pedal hair growth normal.  Neurologic Normal speech. Oriented to person, place, and time. Epicritic sensation to light touch grossly present bilaterally.  Dermatologic Nails well groomed and normal in appearance. No open wounds. No skin lesions.  Orthopedic: Bilateral lateral better ankle pain noted pain with range of motion of the ankle no deep intra-articular pain noted.  No crepitus noted.   Radiographs: None Assessment:   1. Foot pain, bilateral    Plan:  Patient was evaluated and treated and all questions answered.  Bilateral ankle capsulitis -All questions and concerns were discussed with the patient in extensive detail -Given the amount of pain that she is experiencing she will benefit from steroid injection help decrease inflammatory component associate with pain.  Patient agrees with plan like to proceed with steroid injection -A steroid injection was performed at bilateral ankle using 1% plain Lidocaine  and 10 mg of Kenalog . This was well tolerated.   No follow-ups on  file.

## 2023-03-18 ENCOUNTER — Other Ambulatory Visit: Payer: Self-pay

## 2023-03-18 DIAGNOSIS — D0512 Intraductal carcinoma in situ of left breast: Secondary | ICD-10-CM

## 2023-04-10 ENCOUNTER — Other Ambulatory Visit: Payer: Self-pay | Admitting: Nurse Practitioner

## 2023-04-10 DIAGNOSIS — J4530 Mild persistent asthma, uncomplicated: Secondary | ICD-10-CM

## 2023-04-10 DIAGNOSIS — K219 Gastro-esophageal reflux disease without esophagitis: Secondary | ICD-10-CM

## 2023-04-12 ENCOUNTER — Inpatient Hospital Stay: Payer: 59 | Attending: Internal Medicine

## 2023-04-12 ENCOUNTER — Inpatient Hospital Stay (HOSPITAL_BASED_OUTPATIENT_CLINIC_OR_DEPARTMENT_OTHER): Payer: 59 | Admitting: Internal Medicine

## 2023-04-12 ENCOUNTER — Encounter: Payer: Self-pay | Admitting: Internal Medicine

## 2023-04-12 VITALS — BP 123/54 | HR 90 | Temp 98.9°F | Ht 61.0 in | Wt 174.8 lb

## 2023-04-12 DIAGNOSIS — D0512 Intraductal carcinoma in situ of left breast: Secondary | ICD-10-CM

## 2023-04-12 DIAGNOSIS — N183 Chronic kidney disease, stage 3 unspecified: Secondary | ICD-10-CM | POA: Insufficient documentation

## 2023-04-12 DIAGNOSIS — Z800 Family history of malignant neoplasm of digestive organs: Secondary | ICD-10-CM | POA: Diagnosis not present

## 2023-04-12 DIAGNOSIS — Z9011 Acquired absence of right breast and nipple: Secondary | ICD-10-CM | POA: Insufficient documentation

## 2023-04-12 DIAGNOSIS — Z79811 Long term (current) use of aromatase inhibitors: Secondary | ICD-10-CM | POA: Diagnosis not present

## 2023-04-12 DIAGNOSIS — D72829 Elevated white blood cell count, unspecified: Secondary | ICD-10-CM | POA: Insufficient documentation

## 2023-04-12 DIAGNOSIS — Z170 Estrogen receptor positive status [ER+]: Secondary | ICD-10-CM | POA: Insufficient documentation

## 2023-04-12 DIAGNOSIS — Z87891 Personal history of nicotine dependence: Secondary | ICD-10-CM | POA: Diagnosis not present

## 2023-04-12 DIAGNOSIS — M25471 Effusion, right ankle: Secondary | ICD-10-CM | POA: Insufficient documentation

## 2023-04-12 DIAGNOSIS — M8580 Other specified disorders of bone density and structure, unspecified site: Secondary | ICD-10-CM | POA: Diagnosis not present

## 2023-04-12 DIAGNOSIS — Z803 Family history of malignant neoplasm of breast: Secondary | ICD-10-CM | POA: Insufficient documentation

## 2023-04-12 DIAGNOSIS — M25472 Effusion, left ankle: Secondary | ICD-10-CM | POA: Diagnosis not present

## 2023-04-12 DIAGNOSIS — R232 Flushing: Secondary | ICD-10-CM | POA: Diagnosis not present

## 2023-04-12 DIAGNOSIS — I129 Hypertensive chronic kidney disease with stage 1 through stage 4 chronic kidney disease, or unspecified chronic kidney disease: Secondary | ICD-10-CM | POA: Diagnosis not present

## 2023-04-12 DIAGNOSIS — Z923 Personal history of irradiation: Secondary | ICD-10-CM | POA: Insufficient documentation

## 2023-04-12 DIAGNOSIS — R7989 Other specified abnormal findings of blood chemistry: Secondary | ICD-10-CM

## 2023-04-12 LAB — CBC WITH DIFFERENTIAL (CANCER CENTER ONLY)
Abs Immature Granulocytes: 0.06 10*3/uL (ref 0.00–0.07)
Basophils Absolute: 0 10*3/uL (ref 0.0–0.1)
Basophils Relative: 0 %
Eosinophils Absolute: 0.1 10*3/uL (ref 0.0–0.5)
Eosinophils Relative: 1 %
HCT: 40.6 % (ref 36.0–46.0)
Hemoglobin: 13.3 g/dL (ref 12.0–15.0)
Immature Granulocytes: 1 %
Lymphocytes Relative: 22 %
Lymphs Abs: 2.2 10*3/uL (ref 0.7–4.0)
MCH: 30.4 pg (ref 26.0–34.0)
MCHC: 32.8 g/dL (ref 30.0–36.0)
MCV: 92.7 fL (ref 80.0–100.0)
Monocytes Absolute: 0.8 10*3/uL (ref 0.1–1.0)
Monocytes Relative: 8 %
Neutro Abs: 7.1 10*3/uL (ref 1.7–7.7)
Neutrophils Relative %: 68 %
Platelet Count: 361 10*3/uL (ref 150–400)
RBC: 4.38 MIL/uL (ref 3.87–5.11)
RDW: 12.7 % (ref 11.5–15.5)
WBC Count: 10.3 10*3/uL (ref 4.0–10.5)
nRBC: 0 % (ref 0.0–0.2)

## 2023-04-12 LAB — CMP (CANCER CENTER ONLY)
ALT: 19 U/L (ref 0–44)
AST: 19 U/L (ref 15–41)
Albumin: 3.8 g/dL (ref 3.5–5.0)
Alkaline Phosphatase: 83 U/L (ref 38–126)
Anion gap: 11 (ref 5–15)
BUN: 16 mg/dL (ref 8–23)
CO2: 28 mmol/L (ref 22–32)
Calcium: 9.4 mg/dL (ref 8.9–10.3)
Chloride: 98 mmol/L (ref 98–111)
Creatinine: 1.31 mg/dL — ABNORMAL HIGH (ref 0.44–1.00)
GFR, Estimated: 44 mL/min — ABNORMAL LOW (ref >60)
Glucose, Bld: 172 mg/dL — ABNORMAL HIGH (ref 70–99)
Potassium: 3.8 mmol/L (ref 3.5–5.1)
Sodium: 137 mmol/L (ref 135–145)
Total Bilirubin: 0.7 mg/dL (ref 0.0–1.2)
Total Protein: 7.2 g/dL (ref 6.5–8.1)

## 2023-04-12 LAB — VITAMIN D 25 HYDROXY (VIT D DEFICIENCY, FRACTURES): Vit D, 25-Hydroxy: 38.59 ng/mL (ref 30–100)

## 2023-04-12 NOTE — Progress Notes (Signed)
 Was seen In ED 01/2023 for chest pain. CT angio and head done.   Pt states she is having some dizziness.

## 2023-04-12 NOTE — Assessment & Plan Note (Addendum)
#   LEFT BREAST DCIS- ER POSITIVE s/p Lumpectomty [s/p WBRT]; continue Anastrazole 1mg /day. Tolerating well except for joint pain. UNI LEFT Mammogram- AUg 2023 [dr.Piscoya- wnl. ]  Mammogram- SEP 2024- assymetry noted-  awaiting march 2025- Mammo/US.   # Hot flashes- G-2- sec to Arimidex- on Zoloft- stable.   # Hx of right breast cancer/DICS [[facility]] s/p mastectomy- Clinically no evidence of recurrence. Stable.   # CKD stage-III- stable- #Hypertension-well controlled- Stable.    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 Monitor for now. Stable.   # 2023- BMD- Osteopenia- T score: - 1.7- continue ca+ vit D.  Discuss.   # DISPOSITION: # follow up in 6 months- MD: labs- cbc/cmp; vit D 25-OH-  Dr.B

## 2023-04-12 NOTE — Progress Notes (Signed)
 [facility] CONSULT NOTE  Patient Care Team: Al Corpus, MD as PCP - General (Obstetrics and Gynecology) Earna Coder, MD as Consulting Physician (Oncology) [name], MD as Consulting Physician (Radiation Oncology) Salena Saner, MD as Consulting Physician (Pulmonary Disease)  CHIEF COMPLAINTS/PURPOSE OF CONSULTATION: DCIS.   Oncology History Overview Note   # DCIS LEFT [MAY 2023- s/p WBRT [Dr.Crystal.]; ER POSITIVE; JUNE 2023-start anastrozole.  # hx of breast cancer s/p mastectomy [[facility]]  # THROMBOCYTOSIS [NOv 2021 605 -platelets- ; Hb;10.5 white count-WNL]; MARCH 2022-JAK-2NEG; CALR-NEG.BCR-ABL-NEGATIVE.     Ductal carcinoma in situ (DCIS) of left breast  04/15/2015 Initial Diagnosis   Ductal carcinoma in situ (DCIS) of left breast   05/08/2021 Cancer Staging   Staging form: Breast, AJCC 7th Edition - Clinical stage from 05/08/2021: Stage 0 (Tis (DCIS), N0, M0) - Signed by Jeralyn Ruths, MD on 05/08/2021    HISTORY OF PRESENTING ILLNESS: Patient ambulating-independently with assistance.  Alone.  Gail Stevenson 68 y.o.  female pleasant prior history of breast cancer on the right side-s/p mastectomy; and currently with left DCIS s/p radiation  is here for follow-up.  Patient says that she is feeling ok.agrees compliance with anastrazole.   Her ankles stay real swollen. Complains of both bottom of her feet feel like they are on fire   Denies any nausea vomiting.  Denies any fevers or chills.   Review of Systems  Constitutional:  Positive for malaise/fatigue. Negative for chills, diaphoresis, fever and weight loss.  HENT:  Negative for nosebleeds and sore throat.   Eyes:  Negative for double vision.  Respiratory:  Negative for cough, hemoptysis, sputum production, shortness of breath and wheezing.   Cardiovascular:  Negative for chest pain, palpitations, orthopnea and leg swelling.  Gastrointestinal:  Negative for abdominal pain,  blood in stool, constipation, diarrhea, heartburn, melena, nausea and vomiting.  Genitourinary:  Negative for dysuria, frequency and urgency.  Musculoskeletal:  Positive for back pain and joint pain.  Skin: Negative.  Negative for itching and rash.  Neurological:  Negative for dizziness, tingling, focal weakness, weakness and headaches.  Endo/Heme/Allergies:  Does not bruise/bleed easily.  Psychiatric/Behavioral:  Negative for depression. The patient is not nervous/anxious and does not have insomnia.      MEDICAL HISTORY:  Past Medical History:  Diagnosis Date   Asthma    Breast cancer (HCC)    Diabetes mellitus without complication (HCC)    Type 2   Diastolic dysfunction    Diverticulitis    Esophageal varices without bleeding (HCC)    Fothergill's neuralgia    Hemorrhoids    Hepatitis C    History of breast cancer 2003   right side total mastectomy- DCIS   Hypertension    Leg swelling    Leukocytosis    LVH (left ventricular hypertrophy)    Morbid obesity (HCC)    Osteopenia    Personal history of chemotherapy    Personal history of radiation therapy    Sepsis (HCC)    SIRS (systemic inflammatory response syndrome) (HCC)    Thrombocytosis    Trigeminal neuralgia     SURGICAL HISTORY: Past Surgical History:  Procedure Laterality Date   BREAST BIOPSY Left 03/14/2021   [place] bx ribbon marker,differential diagnosisi of intraductal papilloma with ADH or DCIS   BREAST LUMPECTOMY WITH RADIOFREQUENCY TAG IDENTIFICATION Left 04/27/2021   Procedure: BREAST LUMPECTOMY WITH RADIOFREQUENCY TAG IDENTIFICATION;  Surgeon: Henrene Dodge, MD;  Location: [facility] ORS;  Service: General;  Laterality: Left;  BREAST SURGERY Right 2003   total mastectomy   COLONOSCOPY  2019   LAPAROTOMY N/A 01/25/2019   Procedure: EXPLORATORY LAPAROTOMY, ILEOCECECTOMY;  Surgeon: Violeta Gelinas, MD;  Location: [facility] OR;  Service: General;  Laterality: N/A;   MASTECTOMY Right 2003   total   UPPER GI ENDOSCOPY       SOCIAL HISTORY: Social History   Socioeconomic History   Marital status: Divorced    Spouse name: Not on file   Number of children: 1   Years of education: Not on file   Highest education level: Not on file  Occupational History   Not on file  Tobacco Use   Smoking status: Former    Current packs/day: 0.00    Average packs/day: 0.5 packs/day for 10.0 years (5.0 ttl pk-yrs)    Types: Cigarettes    Start date: 05/18/1982    Quit date: 05/17/1992    Years since quitting: 30.9   Smokeless tobacco: Never  Vaping Use   Vaping status: Never Used  Substance and Sexual Activity   Alcohol use: Not Currently    Comment: Occasional beer   Drug use: No   Sexual activity: Yes  Other Topics Concern   Not on file  Social History Narrative   Lives with deceased daughter's boyfriend and ex-husband in [place]; Daughter assists in medical care. Hx of smoking; recovering alcohol. Used to work-house keeping/construction.    Social Drivers of Health   Financial Resource Strain: High Risk (11/06/2021)   Overall Financial Resource Strain (CARDIA)    Difficulty of Paying Living Expenses: Hard  Food Insecurity: No Food Insecurity (06/14/2022)   Hunger Vital Sign    Worried About Running Out of Food in the Last Year: Never true    Ran Out of Food in the Last Year: Never true  Transportation Needs: No Transportation Needs (06/14/2022)   PRAPARE - Administrator, Civil Service (Medical): No    Lack of Transportation (Non-Medical): No  Recent Concern: Transportation Needs - Unmet Transportation Needs (05/01/2022)   PRAPARE - Transportation    Lack of Transportation (Medical): Yes    Lack of Transportation (Non-Medical): Yes  Physical Activity: Insufficiently Active (11/06/2021)   Exercise Vital Sign    Days of Exercise per Week: 3 days    Minutes of Exercise per Session: 30 min  Stress: Stress Concern Present (11/06/2021)   [employer] of Occupational Health - Occupational Stress  Questionnaire    Feeling of Stress : To some extent  Social Connections: Socially Integrated (11/06/2021)   Social Connection and Isolation Panel [NHANES]    Frequency of Communication with Friends and Family: More than three times a week    Frequency of Social Gatherings with Friends and Family: More than three times a week    Attends Religious Services: More than 4 times per year    Active Member of [employer] or Organizations: Yes    Attends Banker Meetings: 1 to 4 times per year    Marital Status: Married  Catering manager Violence: Not At Risk (06/14/2022)   Humiliation, Afraid, Rape, and Kick questionnaire    Fear of Current or Ex-Partner: No    Emotionally Abused: No    Physically Abused: No    Sexually Abused: No    FAMILY HISTORY: Family History  Problem Relation Age of Onset   Gastric cancer Father 74   Breast cancer Maternal Aunt    Breast cancer Daughter 12   Stomach cancer Other  Hypertension Neg Hx    Stroke Neg Hx     ALLERGIES:  is allergic to metformin, patanol [olopatadine], and paroxetine hcl.  MEDICATIONS:  Current Outpatient Medications  Medication Sig Dispense Refill   acetaminophen (TYLENOL) 500 MG tablet Take 2 tablets (1,000 mg total) by mouth every 6 (six) hours as needed for mild pain.     albuterol (VENTOLIN HFA) 108 (90 Base) MCG/ACT inhaler TAKE 2 PUFFS BY MOUTH EVERY 6 HOURS AS NEEDED FOR WHEEZE OR SHORTNESS OF BREATH 8.5 each 2   anastrozole (ARIMIDEX) 1 MG tablet Take 1 tablet (1 mg total) by mouth daily. 90 tablet 3   aspirin EC 81 MG tablet Take 81 mg by mouth daily.     atorvastatin (LIPITOR) 40 MG tablet Take 40 mg by mouth at bedtime.      benzonatate (TESSALON) 100 MG capsule Take 2 capsules (200 mg total) by mouth 3 (three) times daily as needed for cough. 30 capsule 0   carvedilol (COREG) 12.5 MG tablet Take 12.5 mg by mouth 2 (two) times daily.     clotrimazole (LOTRIMIN AF) 1 % cream Apply 1 Application topically 2 (two)  times daily. 30 g 0   empagliflozin (JARDIANCE) 10 MG TABS tablet Take 10 mg by mouth daily.     ENTRESTO 24-26 MG Take 1 tablet by mouth 2 (two) times daily.     fluconazole (DIFLUCAN) 40 MG/ML suspension Take 100 mg by mouth daily.     fluticasone (FLONASE) 50 MCG/ACT nasal spray SPRAY 2 SPRAYS INTO EACH NOSTRIL EVERY DAY 48 mL 2   gabapentin (NEURONTIN) 100 MG capsule Take 100 mg by mouth 2 (two) times daily.     LANTUS SOLOSTAR 100 UNIT/ML Solostar Pen Inject 56 Units into the skin at bedtime.     loperamide (IMODIUM) 2 MG capsule Take 2 mg by mouth 4 (four) times daily as needed for diarrhea or loose stools.     magnesium oxide (MAG-OX) 400 MG tablet Take 800 mg by mouth 2 (two) times daily.     montelukast (SINGULAIR) 10 MG tablet Take 10 mg by mouth at bedtime.     Multiple Vitamin (MULTIVITAMIN WITH MINERALS) TABS tablet Take 4 tablets by mouth daily.     NIFEdipine (PROCARDIA-XL/NIFEDICAL-XL) 30 MG 24 hr tablet Take 30 mg by mouth daily.     ondansetron (ZOFRAN-ODT) 4 MG disintegrating tablet Take 1 tablet by mouth every 8 (eight) hours as needed.     pantoprazole (PROTONIX) 40 MG tablet TAKE 1 TABLET BY MOUTH EVERY DAY 90 tablet 1   sertraline (ZOLOFT) 20 MG/ML concentrated solution Take 100 mg by mouth daily.     SYMBICORT 160-4.5 MCG/ACT inhaler Inhale 1 puff into the lungs daily.     potassium chloride (KLOR-CON) 20 MEQ packet Take 20 mEq by mouth 2 (two) times daily for 5 days. 10 packet 0   No current facility-administered medications for this visit.     PHYSICAL EXAMINATION:   Vitals:   04/12/23 1458  BP: (!) 123/54  Pulse: 90  Temp: 98.9 F (37.2 C)  SpO2: 96%    Filed Weights   04/12/23 1458  Weight: 174 lb 12.8 oz (79.3 kg)    Physical Exam Constitutional:      Comments: Ambulating independently.   HENT:     Head: Normocephalic and atraumatic.     Mouth/Throat:     Pharynx: No oropharyngeal exudate.  Eyes:     Pupils: Pupils are equal, round, and  reactive  to light.  Cardiovascular:     Rate and Rhythm: Normal rate and regular rhythm.  Pulmonary:     Effort: Pulmonary effort is normal. No respiratory distress.     Breath sounds: Normal breath sounds. No wheezing.  Abdominal:     General: Bowel sounds are normal. There is no distension.     Palpations: Abdomen is soft. There is no mass.     Tenderness: There is no abdominal tenderness. There is no guarding or rebound.  Musculoskeletal:        General: No tenderness. Normal range of motion.     Cervical back: Normal range of motion and neck supple.  Skin:    General: Skin is warm.  Neurological:     Mental Status: She is alert and oriented to person, place, and time.  Psychiatric:        Mood and Affect: Affect normal.      LABORATORY DATA:  I have reviewed the data as listed Lab Results  Component Value Date   WBC 10.3 04/12/2023   HGB 13.3 04/12/2023   HCT 40.6 04/12/2023   MCV 92.7 04/12/2023   PLT 361 04/12/2023   Recent Labs    10/05/22 1331 01/25/23 1137 04/12/23 1452  NA 141 134* 137  K 4.2 3.5 3.8  CL 106 100 98  CO2 27 24 28   GLUCOSE 103* 156* 172*  BUN 26* 13 16  CREATININE 1.36* 1.29* 1.31*  CALCIUM 9.2 8.5* 9.4  GFRNONAA 43* 45* 44*  PROT 7.5 7.1 7.2  ALBUMIN 3.8 3.7 3.8  AST 22 21 19   ALT 22 18 19   ALKPHOS 75 86 83  BILITOT 0.6 0.4 0.7  BILIDIR  --  <0.1  --   IBILI  --  NOT CALCULATED  --      No results found.    Ductal carcinoma in situ (DCIS) of left breast # LEFT BREAST DCIS- ER POSITIVE s/p Lumpectomty [s/p WBRT]; continue Anastrazole 1mg /day. Tolerating well except for joint pain. UNI LEFT Mammogram- AUg 2023 [dr.Piscoya- wnl. ]  Mammogram- SEP 2024- assymetry noted-  awaiting march 2025- Mammo/US.   # Hot flashes- G-2- sec to Arimidex- on Zoloft- stable.   # Hx of right breast cancer/DICS [[facility]] s/p mastectomy- Clinically no evidence of recurrence. Stable.   # CKD stage-III- stable- #Hypertension-well controlled- Stable.     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 Monitor for now. Stable.   # 2023- BMD- Osteopenia- T score: - 1.7- continue ca+ vit D.  Discuss.   # DISPOSITION: # follow up in 6 months- MD: labs- cbc/cmp; vit D 25-OH-  Dr.B      Earna Coder, MD 04/12/2023 4:07 PM

## 2023-04-24 ENCOUNTER — Ambulatory Visit
Admission: RE | Admit: 2023-04-24 | Discharge: 2023-04-24 | Disposition: A | Discharge status: Home / Self Care | Payer: 59 | Source: Ambulatory Visit | Attending: Surgery | Admitting: Surgery

## 2023-04-24 ENCOUNTER — Ambulatory Visit
Admission: RE | Admit: 2023-04-24 | Discharge: 2023-04-24 | Disposition: A | Discharge status: Home / Self Care | Payer: 59 | Source: Ambulatory Visit | Attending: Surgery

## 2023-04-24 DIAGNOSIS — D0512 Intraductal carcinoma in situ of left breast: Secondary | ICD-10-CM

## 2023-05-01 ENCOUNTER — Encounter: Payer: Self-pay | Admitting: Surgery

## 2023-05-01 ENCOUNTER — Ambulatory Visit (INDEPENDENT_AMBULATORY_CARE_PROVIDER_SITE_OTHER): Payer: 59 | Admitting: Surgery

## 2023-05-01 VITALS — BP 150/73 | HR 92 | Temp 98.3°F | Ht 61.0 in | Wt 172.6 lb

## 2023-05-01 DIAGNOSIS — D0512 Intraductal carcinoma in situ of left breast: Secondary | ICD-10-CM | POA: Diagnosis not present

## 2023-05-01 NOTE — Progress Notes (Signed)
 05/01/2023  History of Present Illness: Gail Stevenson is a 68 y.o. female status post left breast lumpectomy on 04/27/2021.  Final pathology showed DCIS.  Patient completed radiation therapy and is currently on Arimidex.  The patient had a prior mammogram on 10/23/2022 which showed a probable benign asymmetry in the left breast.  Repeat mammogram was done on 04/24/2023 which showed a stable left breast asymmetry but also showed a new area likely to be fat necrosis in the upper outer quadrant.  Repeat mammogram has been recommended for 6 months.  Today, the patient reports that she has been doing well and denies any worsening issues, pain, palpable masses, or other concerns.  Past Medical History: Past Medical History:  Diagnosis Date   Asthma    Breast cancer (HCC)    Diabetes mellitus without complication (HCC)    Type 2   Diastolic dysfunction    Diverticulitis    Esophageal varices without bleeding (HCC)    Fothergill's neuralgia    Hemorrhoids    Hepatitis C    History of breast cancer 2003   right side total mastectomy- DCIS   Hypertension    Leg swelling    Leukocytosis    LVH (left ventricular hypertrophy)    Morbid obesity (HCC)    Osteopenia    Personal history of chemotherapy    Personal history of radiation therapy    Sepsis (HCC)    SIRS (systemic inflammatory response syndrome) (HCC)    Thrombocytosis    Trigeminal neuralgia      Past Surgical History: Past Surgical History:  Procedure Laterality Date   BREAST BIOPSY Left 03/14/2021   [place] bx ribbon marker,differential diagnosisi of intraductal papilloma with ADH or DCIS   BREAST LUMPECTOMY WITH RADIOFREQUENCY TAG IDENTIFICATION Left 04/27/2021   Procedure: BREAST LUMPECTOMY WITH RADIOFREQUENCY TAG IDENTIFICATION;  Surgeon: Henrene Dodge, MD;  Location: [facility] ORS;  Service: General;  Laterality: Left;   BREAST SURGERY Right 2003   total mastectomy   COLONOSCOPY  2019   LAPAROTOMY N/A 01/25/2019   Procedure:  EXPLORATORY LAPAROTOMY, ILEOCECECTOMY;  Surgeon: Violeta Gelinas, MD;  Location: [facility] OR;  Service: General;  Laterality: N/A;   MASTECTOMY Right 2003   total   UPPER GI ENDOSCOPY      Home Medications: Prior to Admission medications   Medication Sig Start Date End Date Taking? Authorizing Provider  acetaminophen (TYLENOL) 500 MG tablet Take 2 tablets (1,000 mg total) by mouth every 6 (six) hours as needed for mild pain. 04/27/21  Yes Delaney Schnick, MD  albuterol (VENTOLIN HFA) 108 (90 Base) MCG/ACT inhaler TAKE 2 PUFFS BY MOUTH EVERY 6 HOURS AS NEEDED FOR WHEEZE OR SHORTNESS OF BREATH 02/01/23  Yes Cobb, Ruby Cola, NP  anastrozole (ARIMIDEX) 1 MG tablet Take 1 tablet (1 mg total) by mouth daily. 01/28/23  Yes Earna Coder, MD  atorvastatin (LIPITOR) 40 MG tablet Take 40 mg by mouth at bedtime.    Yes [provider]  benzonatate (TESSALON) 100 MG capsule Take 2 capsules (200 mg total) by mouth 3 (three) times daily as needed for cough. 06/15/22  Yes Sreenath, Sudheer B, MD  carvedilol (COREG) 12.5 MG tablet Take 12.5 mg by mouth 2 (two) times daily. 03/20/21  Yes [provider]  clotrimazole (LOTRIMIN AF) 1 % cream Apply 1 Application topically 2 (two) times daily. 05/01/22  Yes Earna Coder, MD  empagliflozin (JARDIANCE) 10 MG TABS tablet Take 10 mg by mouth daily. 05/17/22  Yes [provider]  ENTRESTO 24-26 MG Take 1 tablet by mouth 2 (two) times daily. 03/09/21  Yes [provider]  fluconazole (DIFLUCAN) 40 MG/ML suspension Take 100 mg by mouth daily. 06/12/22  Yes [provider]  fluticasone (FLONASE) 50 MCG/ACT nasal spray SPRAY 2 SPRAYS INTO EACH NOSTRIL EVERY DAY 02/18/23  Yes Cobb, Ruby Cola, NP  gabapentin (NEURONTIN) 100 MG capsule Take 100 mg by mouth 2 (two) times daily. 03/19/21  Yes [provider]  LANTUS SOLOSTAR 100 UNIT/ML Solostar Pen Inject 56 Units into the skin at bedtime.   Yes [provider]   loperamide (IMODIUM) 2 MG capsule Take 2 mg by mouth 4 (four) times daily as needed for diarrhea or loose stools. 03/27/21  Yes [provider]  magnesium oxide (MAG-OX) 400 MG tablet Take 800 mg by mouth 2 (two) times daily. 05/17/22 05/17/23 Yes [provider]  montelukast (SINGULAIR) 10 MG tablet Take 10 mg by mouth at bedtime. 12/26/18  Yes [provider]  Multiple Vitamin (MULTIVITAMIN WITH MINERALS) TABS tablet Take 4 tablets by mouth daily.   Yes [provider]  NIFEdipine (PROCARDIA-XL/NIFEDICAL-XL) 30 MG 24 hr tablet Take 30 mg by mouth daily. 04/30/22  Yes [provider]  ondansetron (ZOFRAN-ODT) 4 MG disintegrating tablet Take 1 tablet by mouth every 8 (eight) hours as needed. 12/31/22  Yes [provider]  pantoprazole (PROTONIX) 40 MG tablet TAKE 1 TABLET BY MOUTH EVERY DAY 04/11/23  Yes Cobb, Ruby Cola, NP  sertraline (ZOLOFT) 20 MG/ML concentrated solution Take 100 mg by mouth daily. 12/05/22 12/05/23 Yes [provider]  SYMBICORT 160-4.5 MCG/ACT inhaler Inhale 1 puff into the lungs daily.   Yes [provider]  potassium chloride (KLOR-CON) 20 MEQ packet Take 20 mEq by mouth 2 (two) times daily for 5 days. 06/26/21 10/22/22  Georga Hacking, MD    Allergies: Allergies  Allergen Reactions   Metformin Other (See Comments) and Swelling    Unable to walk   Patanol [Olopatadine]     Unknown reaction    Paroxetine Hcl Rash    Review of Systems: Review of Systems  Constitutional:  Negative for chills and fever.  Respiratory:  Negative for shortness of breath.   Cardiovascular:  Negative for chest pain.  Gastrointestinal:  Negative for nausea and vomiting.  Skin:  Negative for rash.    Physical Exam BP (!) 150/73   Pulse 92   Temp 98.3 F (36.8 C) (Oral)   Ht 5\' 1"  (1.549 m)   Wt 172 lb 9.6 oz (78.3 kg)   SpO2 99%   BMI 32.61 kg/m  CONSTITUTIONAL: No acute distress HEENT:  Normocephalic,  atraumatic, extraocular motion intact. RESPIRATORY:  Normal respiratory effort without pathologic use of accessory muscles. CARDIOVASCULAR: Regular rhythm and rate. BREAST: Left breast status post lumpectomy with upper outer periareolar incision that is well-healed.  There is still some radiation changes particular at the inferior portion of the left breast which is where the patient has some more discomfort to palpation.  However there were no palpable masses, any other skin changes, or other nipple changes.  There is no left axillary lymphadenopathy.  Right chest status post mastectomy with a very well-healed scar without any palpable masses, skin changes.  Right axilla with incision well-healed also without any palpable lymphadenopathy. NEUROLOGIC:  Motor and sensation is grossly normal.  Cranial nerves are grossly intact. PSYCH:  Alert and oriented to person, place and time. Affect is normal.  Labs/Imaging: Left breast mammogram and  ultrasound on 04/24/2023: FINDINGS: Diagnostic images of the LEFT breast demonstrate mammographic stability of a LEFT breast asymmetry seen on MLO view only. There is a superficial oval circumscribed favored fat containing mass noted immediately adjacent to a densely calcified vessel in the LEFT upper outer breast at anterior depth. There is density and architectural distortion within the LEFT breast, consistent with postsurgical changes. These are stable in comparison to prior.   On physical exam, no suspicious mass is appreciated.   Targeted ultrasound was performed of the LEFT breast. No suspicious cystic or solid mass is seen in the far outer LEFT breast at a second potential area for the correlate of asymmetry concern.   At 2 o'clock 3 cm from the nipple there is a superficial echogenic area immediately adjacent to a densely calcified vessel. This measures approximately 3 mm. There is a second echogenic area with a mildly hypoechoic core in the immediate vicinity  which measures up to 4 mm. These likely reflect benign fat necrosis and may correspond to the favored fat containing mass noted mammographically.   IMPRESSION: 1. Stable probably benign LEFT breast asymmetry seen on MLO view only. Recommend follow-up diagnostic mammogram in 6 months. This was about 1 year definitive stability. 2. Superficial favored fat containing mass in the LEFT upper outer breast adjacent to a densely calcified vessels is favored to reflect fat necrosis. Recommend follow-up mammogram and ultrasound in 6 months to assess for appropriate evolution.   RECOMMENDATION: LEFT diagnostic mammogram and ultrasound in 6 months   I have discussed the findings and recommendations with the patient. If applicable, a reminder letter will be sent to the patient regarding the next appointment.   BI-RADS CATEGORY  3: Probably benign.  Assessment and Plan: This is a 68 y.o. female status post left breast lumpectomy paints  - The patient is now 2 years out from her original surgery in the left breast.  Her mammogram from last week shows stability in the left breast asymmetry noted on mammogram back in September 2024.  However there is a new finding of fat necrosis in the left upper outer quadrant.  As such, repeat mammogram in 6 months has been recommended.  Discussed with the patient the findings on mammogram and recommendations and she is in agreement.  Otherwise, her exam today is reassuring.  There is some discomfort in the inferior portion of the left breast where there are still some radiation changes.  Discussed with patient this may improve with time but may still have some remnant symptoms. - Follow-up in 6 months after mammogram/ultrasound.  I spent 20 minutes dedicated to the care of this patient on the date of this encounter to include pre-visit review of records, face-to-face time with the patient discussing diagnosis and management, and any post-visit coordination of care.   Howie Ill, MD [facility]

## 2023-05-01 NOTE — Patient Instructions (Addendum)
 We will see you in September after a Diagnostic mammogram and ultrasound.   Breast Self-Awareness Breast self-awareness is knowing how your breasts look and feel. You need to: Check your breasts on a regular basis. Tell your doctor about any changes. Become familiar with the look and feel of your breasts. This can help you catch a breast problem while it is still small and can be treated. You should do breast self-exams even if you have breast implants. What you need: A mirror. A well-lit room. A pillow or other soft object. How to do a breast self-exam Follow these steps to do a breast self-exam: Look for changes  Take off all the clothes above your waist. Stand in front of a mirror in a room with good lighting. Put your hands down at your sides. Compare your breasts in the mirror. Look for any difference between them, such as: A difference in shape. A difference in size. Wrinkles, dips, and bumps in one breast and not the other. Look at each breast for changes in the skin, such as: Redness. Scaly areas. Skin that has gotten thicker. Dimpling. Open sores (ulcers). Look for changes in your nipples, such as: Fluid coming out of a nipple. Fluid around a nipple. Bleeding. Dimpling. Redness. A nipple that looks pushed in (retracted), or that has changed position. Feel for changes Lie on your back. Feel each breast. To do this: Pick a breast to feel. Place a pillow under the shoulder closest to that breast. Put the arm closest to that breast behind your head. Feel the nipple area of that breast using the hand of your other arm. Feel the area with the pads of your three middle fingers by making small circles with your fingers. Use light, medium, and firm pressure. Continue the overlapping circles, moving downward over the breast. Keep making circles with your fingers. Stop when you feel your ribs. Start making circles with your fingers again, this time going upward until you reach  your collarbone. Then, make circles outward across your breast and into your armpit area. Squeeze your nipple. Check for discharge and lumps. Repeat these steps to check your other breast. Sit or stand in the tub or shower. With soapy water on your skin, feel each breast the same way you did when you were lying down. Write down what you find Writing down what you find can help you remember what to tell your doctor. Write down: What is normal for each breast. Any changes you find in each breast. These include: The kind of changes you find. A tender or painful breast. Any lump you find. Write down its size and where it is. When you last had your monthly period (menstrual cycle). General tips If you are breastfeeding, the best time to check your breasts is after you feed your baby or after you use a breast pump. If you get monthly bleeding, the best time to check your breasts is 5-7 days after your monthly cycle ends. With time, you will become comfortable with the self-exam. You will also start to know if there are changes in your breasts. Contact a doctor if: You see a change in the shape or size of your breasts or nipples. You see a change in the skin of your breast or nipples, such as red or scaly skin. You have fluid coming from your nipples that is not normal. You find a new lump or thick area. You have breast pain. You have any concerns about your breast health.  Summary Breast self-awareness includes looking for changes in your breasts and feeling for changes within your breasts. You should do breast self-awareness in front of a mirror in a well-lit room. If you get monthly periods (menstrual cycles), the best time to check your breasts is 5-7 days after your period ends. Tell your doctor about any changes you see in your breasts. Changes include changes in size, changes on the skin, painful or tender breasts, or fluid from your nipples that is not normal. This information is not  intended to replace advice given to you by your health care provider. Make sure you discuss any questions you have with your health care provider. Document Revised: 07/06/2021 Document Reviewed: 12/01/2020 Elsevier Patient Education  2024 [employer].

## 2023-05-09 ENCOUNTER — Ambulatory Visit: Payer: 59 | Admitting: Pulmonary Disease

## 2023-07-16 ENCOUNTER — Encounter (HOSPITAL_COMMUNITY): Payer: Self-pay

## 2023-07-16 ENCOUNTER — Emergency Department (HOSPITAL_COMMUNITY)
Admission: EM | Admit: 2023-07-16 | Discharge: 2023-07-16 | Disposition: A | Discharge status: Home / Self Care | Attending: Emergency Medicine | Admitting: Emergency Medicine

## 2023-07-16 ENCOUNTER — Emergency Department (HOSPITAL_COMMUNITY)

## 2023-07-16 DIAGNOSIS — Z7951 Long term (current) use of inhaled steroids: Secondary | ICD-10-CM | POA: Diagnosis not present

## 2023-07-16 DIAGNOSIS — K219 Gastro-esophageal reflux disease without esophagitis: Secondary | ICD-10-CM

## 2023-07-16 DIAGNOSIS — J4530 Mild persistent asthma, uncomplicated: Secondary | ICD-10-CM

## 2023-07-16 DIAGNOSIS — T59811A Toxic effect of smoke, accidental (unintentional), initial encounter: Secondary | ICD-10-CM | POA: Insufficient documentation

## 2023-07-16 DIAGNOSIS — Z79899 Other long term (current) drug therapy: Secondary | ICD-10-CM | POA: Diagnosis not present

## 2023-07-16 DIAGNOSIS — R0602 Shortness of breath: Secondary | ICD-10-CM | POA: Diagnosis not present

## 2023-07-16 DIAGNOSIS — R059 Cough, unspecified: Secondary | ICD-10-CM | POA: Diagnosis not present

## 2023-07-16 DIAGNOSIS — R058 Other specified cough: Secondary | ICD-10-CM

## 2023-07-16 DIAGNOSIS — J45909 Unspecified asthma, uncomplicated: Secondary | ICD-10-CM | POA: Diagnosis not present

## 2023-07-16 LAB — I-STAT CHEM 8, ED
BUN: 20 mg/dL (ref 8–23)
Calcium, Ion: 1.19 mmol/L (ref 1.15–1.40)
Chloride: 102 mmol/L (ref 98–111)
Creatinine, Ser: 1.4 mg/dL — ABNORMAL HIGH (ref 0.44–1.00)
Glucose, Bld: 145 mg/dL — ABNORMAL HIGH (ref 70–99)
HCT: 42 % (ref 36.0–46.0)
Hemoglobin: 14.3 g/dL (ref 12.0–15.0)
Potassium: 3.9 mmol/L (ref 3.5–5.1)
Sodium: 140 mmol/L (ref 135–145)
TCO2: 28 mmol/L (ref 22–32)

## 2023-07-16 LAB — COOXEMETRY PANEL
Carboxyhemoglobin: 1.5 % (ref 0.5–1.5)
Methemoglobin: <0.7 % (ref 0.0–1.5)
O2 Saturation: 80.9 %
Total hemoglobin: 14.4 g/dL (ref 12.0–16.0)

## 2023-07-16 MED ORDER — NIFEDIPINE ER OSMOTIC RELEASE 30 MG PO TB24: 30.0000 mg | ORAL_TABLET | Freq: Every day | ORAL | 2 refills | Status: AC

## 2023-07-16 MED ORDER — CARVEDILOL 12.5 MG PO TABS
12.5000 mg | ORAL_TABLET | Freq: Two times a day (BID) | ORAL | 2 refills | Status: AC
Start: 2023-07-16 — End: ?

## 2023-07-16 MED ORDER — EMPAGLIFLOZIN 10 MG PO TABS: 10.0000 mg | ORAL_TABLET | Freq: Every day | ORAL | 2 refills | Status: AC

## 2023-07-16 MED ORDER — GABAPENTIN 100 MG PO CAPS: 100.0000 mg | ORAL_CAPSULE | Freq: Two times a day (BID) | ORAL | 2 refills | Status: AC

## 2023-07-16 MED ORDER — SERTRALINE HCL 20 MG/ML PO CONC: 100.0000 mg | Freq: Every day | ORAL | 2 refills | Status: AC

## 2023-07-16 MED ORDER — LANTUS SOLOSTAR 100 UNIT/ML ~~LOC~~ SOPN: 56.0000 [IU] | PEN_INJECTOR | Freq: Every day | SUBCUTANEOUS | 2 refills | Status: AC

## 2023-07-16 MED ORDER — LOPERAMIDE HCL 2 MG PO CAPS: 2.0000 mg | ORAL_CAPSULE | Freq: Four times a day (QID) | ORAL | 2 refills | Status: AC | PRN

## 2023-07-16 MED ORDER — FLUTICASONE PROPIONATE 50 MCG/ACT NA SUSP: 2.0000 | Freq: Every day | NASAL | 2 refills | Status: AC

## 2023-07-16 MED ORDER — PANTOPRAZOLE SODIUM 40 MG PO TBEC: 40.0000 mg | DELAYED_RELEASE_TABLET | Freq: Every day | ORAL | 1 refills | Status: AC

## 2023-07-16 MED ORDER — ALBUTEROL SULFATE HFA 108 (90 BASE) MCG/ACT IN AERS: 2.0000 | INHALATION_SPRAY | RESPIRATORY_TRACT | 2 refills | Status: AC | PRN

## 2023-07-16 MED ORDER — MONTELUKAST SODIUM 10 MG PO TABS: 10.0000 mg | ORAL_TABLET | Freq: Every day | ORAL | 2 refills | Status: AC

## 2023-07-16 MED ORDER — SYMBICORT 160-4.5 MCG/ACT IN AERO: 1.0000 | INHALATION_SPRAY | Freq: Every day | RESPIRATORY_TRACT | 2 refills | Status: AC

## 2023-07-16 MED ORDER — ANASTROZOLE 1 MG PO TABS: 1.0000 mg | ORAL_TABLET | Freq: Every day | ORAL | 3 refills | Status: AC

## 2023-07-16 MED ORDER — ATORVASTATIN CALCIUM 40 MG PO TABS
40.0000 mg | ORAL_TABLET | Freq: Every day | ORAL | 2 refills | Status: AC
Start: 2023-07-16 — End: ?

## 2023-07-16 MED ORDER — ENTRESTO 24-26 MG PO TABS: 1.0000 | ORAL_TABLET | Freq: Two times a day (BID) | ORAL | 2 refills | Status: AC

## 2023-07-16 NOTE — ED Notes (Signed)
Got patient undressed into a gown on the monitor did EKG patient is resting with call bell in reach
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
## 2023-08-24 ENCOUNTER — Emergency Department

## 2023-08-24 ENCOUNTER — Emergency Department
Admission: EM | Admit: 2023-08-24 | Discharge: 2023-08-24 | Disposition: A | Discharge status: Home / Self Care | Attending: Emergency Medicine | Admitting: Emergency Medicine

## 2023-08-24 DIAGNOSIS — R059 Cough, unspecified: Secondary | ICD-10-CM | POA: Insufficient documentation

## 2023-08-24 DIAGNOSIS — E119 Type 2 diabetes mellitus without complications: Secondary | ICD-10-CM | POA: Insufficient documentation

## 2023-08-24 DIAGNOSIS — R109 Unspecified abdominal pain: Secondary | ICD-10-CM | POA: Insufficient documentation

## 2023-08-24 DIAGNOSIS — R062 Wheezing: Secondary | ICD-10-CM | POA: Diagnosis not present

## 2023-08-24 DIAGNOSIS — M7918 Myalgia, other site: Secondary | ICD-10-CM | POA: Insufficient documentation

## 2023-08-24 DIAGNOSIS — R6889 Other general symptoms and signs: Secondary | ICD-10-CM | POA: Insufficient documentation

## 2023-08-24 DIAGNOSIS — D72829 Elevated white blood cell count, unspecified: Secondary | ICD-10-CM | POA: Diagnosis not present

## 2023-08-24 DIAGNOSIS — I10 Essential (primary) hypertension: Secondary | ICD-10-CM | POA: Insufficient documentation

## 2023-08-24 DIAGNOSIS — R52 Pain, unspecified: Secondary | ICD-10-CM

## 2023-08-24 HISTORY — DX: Bronchitis, not specified as acute or chronic: J40

## 2023-08-24 LAB — URINALYSIS, ROUTINE W REFLEX MICROSCOPIC
Bilirubin Urine: NEGATIVE
Glucose, UA: NEGATIVE mg/dL
Hgb urine dipstick: NEGATIVE
Ketones, ur: NEGATIVE mg/dL
Leukocytes,Ua: NEGATIVE
Nitrite: NEGATIVE
Protein, ur: NEGATIVE mg/dL
Specific Gravity, Urine: 1.029 (ref 1.005–1.030)
pH: 6 (ref 5.0–8.0)

## 2023-08-24 LAB — HEPATIC FUNCTION PANEL
ALT: 14 U/L (ref 0–44)
AST: 17 U/L (ref 15–41)
Albumin: 3 g/dL — ABNORMAL LOW (ref 3.5–5.0)
Alkaline Phosphatase: 52 U/L (ref 38–126)
Bilirubin, Direct: 0.2 mg/dL (ref 0.0–0.2)
Indirect Bilirubin: 0.9 mg/dL (ref 0.3–0.9)
Total Bilirubin: 1.1 mg/dL (ref 0.0–1.2)
Total Protein: 7.2 g/dL (ref 6.5–8.1)

## 2023-08-24 LAB — CBC
HCT: 37.8 % (ref 36.0–46.0)
Hemoglobin: 12.2 g/dL (ref 12.0–15.0)
MCH: 29.5 pg (ref 26.0–34.0)
MCHC: 32.3 g/dL (ref 30.0–36.0)
MCV: 91.5 fL (ref 80.0–100.0)
Platelets: 371 K/uL (ref 150–400)
RBC: 4.13 MIL/uL (ref 3.87–5.11)
RDW: 13 % (ref 11.5–15.5)
WBC: 15.4 K/uL — ABNORMAL HIGH (ref 4.0–10.5)
nRBC: 0 % (ref 0.0–0.2)

## 2023-08-24 LAB — BASIC METABOLIC PANEL WITH GFR
Anion gap: 10 (ref 5–15)
BUN: 14 mg/dL (ref 8–23)
CO2: 27 mmol/L (ref 22–32)
Calcium: 9.8 mg/dL (ref 8.9–10.3)
Chloride: 100 mmol/L (ref 98–111)
Creatinine, Ser: 1.09 mg/dL — ABNORMAL HIGH (ref 0.44–1.00)
GFR, Estimated: 55 mL/min — ABNORMAL LOW (ref >60)
Glucose, Bld: 204 mg/dL — ABNORMAL HIGH (ref 70–99)
Potassium: 3.9 mmol/L (ref 3.5–5.1)
Sodium: 137 mmol/L (ref 135–145)

## 2023-08-24 LAB — LIPASE, BLOOD: Lipase: 24 U/L (ref 11–51)

## 2023-08-24 LAB — RESP PANEL BY RT-PCR (RSV, FLU A&B, COVID)  RVPGX2
Influenza A by PCR: NEGATIVE
Influenza B by PCR: NEGATIVE
Resp Syncytial Virus by PCR: NEGATIVE
SARS Coronavirus 2 by RT PCR: NEGATIVE

## 2023-08-24 LAB — TROPONIN I (HIGH SENSITIVITY): Troponin I (High Sensitivity): 9 ng/L (ref <18)

## 2023-08-24 MED ORDER — ONDANSETRON HCL 4 MG/2ML IJ SOLN
4.0000 mg | Freq: Once | INTRAMUSCULAR | Status: AC
  Administered 2023-08-24: 4 mg via INTRAVENOUS
  Filled 2023-08-24: qty 2

## 2023-08-24 MED ORDER — MORPHINE SULFATE (PF) 4 MG/ML IV SOLN
4.0000 mg | Freq: Once | INTRAVENOUS | Status: AC
  Administered 2023-08-24: 4 mg via INTRAVENOUS
  Filled 2023-08-24: qty 1

## 2023-08-24 MED ORDER — IOHEXOL 300 MG/ML  SOLN
100.0000 mL | Freq: Once | INTRAMUSCULAR | Status: AC | PRN
  Administered 2023-08-24: 100 mL via INTRAVENOUS

## 2023-08-24 MED ORDER — SODIUM CHLORIDE 0.9 % IV BOLUS
1000.0000 mL | Freq: Once | INTRAVENOUS | Status: AC
  Administered 2023-08-24: 1000 mL via INTRAVENOUS

## 2023-08-24 MED ORDER — IPRATROPIUM-ALBUTEROL 0.5-2.5 (3) MG/3ML IN SOLN
3.0000 mL | Freq: Once | RESPIRATORY_TRACT | Status: AC
  Administered 2023-08-24: 3 mL via RESPIRATORY_TRACT
  Filled 2023-08-24: qty 3

## 2023-08-24 NOTE — Discharge Instructions (Signed)
 Your laboratory workup was reassuring today with no obvious signs of infection.  Your chest x-ray is reassuring as well as the CT of your stomach.  No signs of infection in your urine.  Negative for COVID, flu, and RSV.  Please follow-up with your primary care provider for reassessment in the next week and make sure you are staying hydrated at home.

## 2023-08-24 NOTE — ED Notes (Signed)
 Pt to CT

## 2023-08-24 NOTE — ED Triage Notes (Signed)
 Pt to ED with family member for body aches, dizziness, cough, wheezing, and decreased mobility with bilateral leg weakness all since about 1 week ago. Skin is dry. Speaking in full sentences.

## 2023-08-24 NOTE — ED Provider Notes (Signed)
 [facility] Provider Note    Event Date/Time   First MD Initiated Contact with Patient 08/24/23 1110     (approximate)   History   Generalized Body Aches and Dizziness   HPI Neriyah Cercone is a 68 y.o. female with history of HTN, HLD, DM2, GERD presenting today for generalized body aches.  Patient presents for multiple complaints occurring over the past week.  This includes generalized body aches, cough, wheezing, abdominal pain, nausea, diarrhea, and dysuria.  Has not been able to eat and drink very well.  Difficulty ambulating because of the generalized pain.  Also intermittently feels lightheaded but no passing out.  Denies any chest pain, vomiting, hematuria.     Physical Exam   Triage Vital Signs: ED Triage Vitals  Encounter Vitals Group     BP 08/24/23 0914 119/70     Girls Systolic BP Percentile --      Girls Diastolic BP Percentile --      Boys Systolic BP Percentile --      Boys Diastolic BP Percentile --      Pulse Rate 08/24/23 0914 (!) 109     Resp 08/24/23 0914 20     Temp 08/24/23 0911 98.5 F (36.9 C)     Temp Source 08/24/23 0911 Oral     SpO2 08/24/23 0914 99 %     Weight 08/24/23 0915 175 lb (79.4 kg)     Height 08/24/23 0915 5' 1 (1.549 m)     Head Circumference --      Peak Flow --      Pain Score 08/24/23 0911 10     Pain Loc --      Pain Education --      Exclude from Growth Chart --     Most recent vital signs: Vitals:   08/24/23 1118 08/24/23 1309  BP: (!) 145/80 (!) 173/69  Pulse: (!) 111 (!) 116  Resp: 16 20  Temp: 98.4 F (36.9 C) 98.1 F (36.7 C)  SpO2: 95% 98%   Physical Exam: I have reviewed the vital signs and nursing notes. General: Awake, alert, no acute distress.  Nontoxic appearing. Head:  Atraumatic, normocephalic.   ENT:  EOM intact, PERRL. Oral mucosa is pink and moist with no lesions. Neck: Neck is supple with full range of motion, No meningeal signs. Cardiovascular:  RRR, No murmurs.  Peripheral pulses palpable and equal bilaterally. Respiratory:  Symmetrical chest wall expansion.  No rhonchi, rales, or wheezes.  Good air movement throughout.  No use of accessory muscles.   Musculoskeletal:  No cyanosis or edema. Moving extremities with full ROM Abdomen:  Soft, tender to palpation in the epigastric, left upper quadrant, left lower quadrant, nondistended. Neuro:  GCS 15, moving all four extremities, interacting appropriately. Speech clear. Psych:  Calm, appropriate.   Skin:  Warm, dry, no rash.     ED Results / Procedures / Treatments   Labs (all labs ordered are listed, but only abnormal results are displayed) Labs Reviewed  BASIC METABOLIC PANEL WITH GFR - Abnormal; Notable for the following components:      Result Value   Glucose, Bld 204 (*)    Creatinine, Ser 1.09 (*)    GFR, Estimated 55 (*)    All other components within normal limits  CBC - Abnormal; Notable for the following components:   WBC 15.4 (*)    All other components within normal limits  URINALYSIS, ROUTINE W REFLEX MICROSCOPIC - Abnormal; Notable for  the following components:   Color, Urine YELLOW (*)    APPearance CLEAR (*)    All other components within normal limits  HEPATIC FUNCTION PANEL - Abnormal; Notable for the following components:   Albumin  3.0 (*)    All other components within normal limits  RESP PANEL BY RT-PCR (RSV, FLU A&B, COVID)  RVPGX2  LIPASE, BLOOD  TROPONIN I (HIGH SENSITIVITY)  TROPONIN I (HIGH SENSITIVITY)     EKG My EKG interpretation: Rate of 106, sinus tachycardia, normal axis, normal intervals.  No acute ST elevations or depressions   RADIOLOGY Independently interpreted chest x-ray as well as CT abdomen/pelvis with no acute pathology   PROCEDURES:  Critical Care performed: No  Procedures   MEDICATIONS ORDERED IN ED: Medications  sodium chloride  0.9 % bolus 1,000 mL (1,000 mLs Intravenous New Bag/Given 08/24/23 1154)  ondansetron  (ZOFRAN ) injection  4 mg (4 mg Intravenous Given 08/24/23 1151)  morphine  (PF) 4 MG/ML injection 4 mg (4 mg Intravenous Given 08/24/23 1150)  ipratropium-albuterol  (DUONEB) 0.5-2.5 (3) MG/3ML nebulizer solution 3 mL (3 mLs Nebulization Given 08/24/23 1153)  iohexol  (OMNIPAQUE ) 300 MG/ML solution 100 mL (100 mLs Intravenous Contrast Given 08/24/23 1212)  sodium chloride  0.9 % bolus 1,000 mL (1,000 mLs Intravenous New Bag/Given 08/24/23 1316)     IMPRESSION / MDM / ASSESSMENT AND PLAN / ED COURSE  I reviewed the triage vital signs and the nursing notes.                              Differential diagnosis includes, but is not limited to, viral URI, COVID, flu, RSV, pneumonia, viral GI infection, enteritis, colitis, diverticulitis, gastritis, pancreatitis  Patient's presentation is most consistent with acute complicated illness / injury requiring diagnostic workup.  Patient is a 68 year old female presenting today for multiple complaints including generalized body aches, dysuria, cough, wheezing, abdominal pain, and nausea.  The constellation of symptoms seem most consistent with viral infection although given her age and tenderness to her abdomen, will get extensive imaging workup including chest x-ray as well as CT abdomen/pelvis to rule out other acute infections.  CBC with mild leukocytosis and BMP with slight AKI but no other concerning features.  Respiratory panel negative.  Chest x-ray with no signs of pneumonia.  Patient given DuoNeb over her concern of wheezing although I did not appreciate on my exam as well as 1 L fluids, Zofran , and morphine .  CT abdomen/pelvis shows no acute intra-abdominal pathology.  UA negative.  Given second liter of fluids over concerns for dehydration.  Patient was reassessed following all this and feels much better at this time with no ongoing complaints outside mild generalized bodyaches.  Given overall reassuring workup, do not see any other concerning life-threatening pathology and heart  rate improved into the 90s.  Suspect most likely viral etiology and patient otherwise stable for discharge and follow-up with her PCP for visit next Wednesday.  Given strict return precautions and agreeable with plan.     FINAL CLINICAL IMPRESSION(S) / ED DIAGNOSES   Final diagnoses:  Generalized body aches  Flu-like symptoms     Rx / DC Orders   ED Discharge Orders     None        Note:  This document was prepared using Dragon voice recognition software and may include unintentional dictation errors.   Malvina Alm DASEN, MD 08/24/23 [phone]

## 2023-08-24 NOTE — ED Notes (Signed)
 RN assisted pt to restroom to collect UA sample pt was not able to provide a sample at this time

## 2023-08-24 NOTE — ED Notes (Signed)
 Pt reports generalized body aches for the past 2 weeks, cough, dizziness, was able to have a BM this morning after 2 days. Pt talks in complete sentences no respiratory distress noted

## 2023-08-24 NOTE — ED Notes (Addendum)
 Pt states she has been able to ambulate since being sick just weaker than normal.

## 2023-08-24 NOTE — ED Notes (Signed)
 Wheeled pt to restroom pt able to stand and walk to commode with no assistance.

## 2023-08-29 ENCOUNTER — Other Ambulatory Visit: Payer: Self-pay

## 2023-08-29 DIAGNOSIS — D0512 Intraductal carcinoma in situ of left breast: Secondary | ICD-10-CM

## 2023-10-09 NOTE — ED Provider Notes (Signed)
 Received sign out from previous provider, Dr. Malka.  Patient Summary: Gail Stevenson is a 68 y.o. female with a PMH of HTN, HLD, DM2, HFrEF presenting for 4 days of intermittent chest pain, lightheadedness, nausea, vomiting.  Medical workup in the ER grossly unremarkable.  Her magnesium  was a bit low and replenished.  Patient having mild abdominal tenderness so ordered CT A/P. Action List:  CT A/P Likely discharge  Updates ED Course as of 10/10/23 0215  Thu Oct 10, 2023  0108 CT A/P negative for acute intra-abdominal pathology.  Will p.o. challenge and discharge.  [id number] Patient tolerating p.o. without difficulty.  Placed referral to chest pain clinic.  Return precautions reviewed and patient is agreeable to plan.     John LeMoine, MD  Emergency Medicine PGY-1

## 2023-10-09 NOTE — ED Provider Notes (Signed)
 Gail Stevenson   ED Clinical Impression   Final diagnoses:  None    Impression, Medical Decision Making, ED Course   Impression: 68 y.o. female with PMH most significant for hypertension, hyperlipidemia, type 2 diabetes, HFrEF (echo on 08/14/2022 with EF greater than 55%) who presents with chest pain and dizziness as described below.   DDx/MDM: Patient is a 68 year old female with history of hypertension, hyperlipidemia, and type 2 diabetes presenting to the ED with chest pain and dizziness for the past 4 days.  Chest pain is stabbing intermittent.  She describes her dizziness as a lightheadedness and feels she will pass out, however she has not had any syncopal episodes.  Sometimes symptoms worsen with exertion.  She also has nausea which is chronic as she is prescribed Zofran  from her PCP.  She has longstanding GERD and has an endoscopy scheduled for 10/25/2023.  On exam, patient is hemodynamically stable and vitals are within range.  Heart sounds are normal and lungs are clear to auscultation.  Her abdomen is soft and nontender.  Differentials include ACS, GERD, orthostatic hypotension, viral URI.  Given patient's comorbidities, we will do thorough cardiac workup with CBC, CMP, magnesium , troponins, BNP, EKG, and chest x-ray.  Symptoms could be due to her uncontrolled GERD, we will give a dose of Zofran  and try a GI cocktail to look for symptom improvement.  Considered PE, however patient is low risk and has no lower extremity swelling and is not tachycardic.  Diagnostic workup as below. Will treat patient with Zofran  and GI cocktail.  Orders Placed This Encounter  Procedures  . XR Chest 2 views  . ED POCUS  . CT Abdomen Pelvis W IV Contrast Only  . Pro-BNP  . CBC w/ Differential  . Comprehensive Metabolic Panel  . Magnesium   . TSH  . Urinalysis with Microscopy with Culture Reflex (Clean Catch)  . hsTroponin I (serial 0-2-6H w/ delta)  . hsTroponin I -  2 Hour  . hsTroponin I - 6 Hour  . Orthostatic vital signs  . ECG 12 Lead    ED Course as of 10/09/23 2126  Wed Oct 09, 2023  1638 Labs reviewed.  Magnesium  low at 1.1, will replete with 1 g magnesium  sulfate IV.  Creatinine 1.3, GFR 45, seems to be her baseline.  She does have a slightly elevated white count of 11.3.  TSH within range.  Initial troponin 7.  BNP within range at 263.  Patient does have a slight anion gap of 15, likely due to mild ketosis from vomiting.  We will give her 500 mL of fluids, given history of heart failure.  UA negative for infection.  1641 Bedside POCUS echo shows normal EF, no evidence of pericardial fusion or right heart strain.   1704 On reassessment, patient states her chest pain has significantly improved.  It is reassuring that patient's clinical picture has improved with a GI cocktail and her cardiac workup is unremarkable.  Her chest pain could be due to acid reflux.  She is followed by GI and does have an endoscopy scheduled in September.  To Stevenson, patient did have some right lower quadrant tenderness on reexamination.  Correlating with the nausea, I be beneficial to obtain a CT abdomen pelvis for further evaluation.  1951 Repeat troponin is 8 with a delta of 1. Given reassuring workup, patient will likely be discharged home pending abdomen CT. Pain and nausea continues to be well-controlled. Patient handed off to resident physician  Dr. Lemoine.     MDM Elements  Independent Interpretation of Studies: EKG(s) - EKG in normal sinus rhythm, regular rate of 88, no acute signs of ischemia and X-ray(s) - no acute findings on chest x-ray I have reviewed recent and relevant previous record, including: Outpatient notes - reviewed cardiology notes     ____________________________________________  The case was discussed with the attending physician, who is in agreement with the above assessment and plan.    History   Chief Complaint Chief Complaint  Patient  presents with  . Chest Pain    HPI  History of Present Illness Gail Stevenson is a 68 year old female who presents with dizziness and chest pain. She is accompanied by her daughter. She was at a routine follow-up appointment with her doctor when she experienced dizziness and chest pain, which led to her being sent to the emergency department.  She has been experiencing dizziness and chest pain since Sunday. The dizziness began first, followed by chest pain described as stabbing and intermittent. The symptoms were particularly severe on Sunday and today, leading to her referral to the emergency department.  The dizziness can last for a couple of hours and is sometimes accompanied by chest pain. She experiences nausea and vomiting, particularly in the morning and sometimes at night if her head is not propped up. She has been taking Zofran , four doses yesterday, to manage her nausea, which worsened to the point she could not eat dinner. She also reports seeing 'little white dots' when feeling dizzy.  She has a history of ulcers and acid reflux, which she manages with medication, though she is unsure of the specific names. Her chest pain sometimes worsens after eating, and she experiences choking when eating, which she attributes to her reflux. She is scheduled for an endoscopy in September.  She has a history of diabetes, high blood pressure, and high cholesterol. Her ankles have been swollen for the past year. She denies any current alcohol or tobacco use, though she has smoked socially in the past. She takes her medications twice daily, as prescribed, with her daughter ensuring adherence.  No fever, but she has a cough that started last week, which she associates with choking. She is also experiencing hot flashes, which she attributes to menopause.    Past Medical History[1]  Past Surgical History[2]  Active Medications[3]   Allergies[4]  Family History[5]  Short Social History[6]    Physical Exam   VITAL SIGNS:    Vitals:   10/09/23 1640 10/09/23 1642 10/09/23 1645 10/09/23 1700  BP: 147/70 133/70 133/70 172/82  Pulse:   83 84  Resp:   22 19  Temp:      TempSrc:      SpO2:    99%    Constitutional: Alert and oriented. No acute distress. Eyes: Conjunctivae are normal. HEENT: Normocephalic and atraumatic. Conjunctivae clear. No congestion. Moist mucous membranes.  Cardiovascular: Rate as above, regular rhythm. Normal and symmetric distal pulses. Brisk capillary refill. Normal skin turgor. Respiratory: Normal respiratory effort. Breath sounds are normal. There are no wheezing or crackles heard. Gastrointestinal: Soft, non-distended, non-tender. Genitourinary: Deferred. Musculoskeletal: Non-tender with normal range of motion in all extremities. Neurologic: Normal speech and language. No gross focal neurologic deficits are appreciated. Patient is moving all extremities equally, face is symmetric at rest and with speech. Skin: Skin is warm, dry and intact. No rash noted. Psychiatric: Mood and affect are normal. Speech and behavior are normal.   Radiology  XR Chest 2 views  Final Result  No significant cardiopulmonary abnormalities.          ED POCUS      CT Abdomen Pelvis W IV Contrast Only    (Results Pending)    Pertinent labs & imaging results that were available during my care of the patient were independently interpreted by me and considered in my medical decision making (see chart for details).  Portions of this record have been created using Scientist, clinical (histocompatibility and immunogenetics). Dictation errors have been sought, but may not have been identified and corrected.        [1] Past Medical History: Diagnosis Date  . Arthritis   . Asthma (HHS-HCC)   . Breast cancer        RIGHT BREAST - 2003  . Cancer       . Chronic hepatitis C     04/26/2004   POS ANTIBODIES TO HEP A AND B   . Depressive disorder 11/30/2004  . Diabetes mellitus       . Disorder of  lipid metabolism 09/03/2007  . GERD (gastroesophageal reflux disease)   . Hematuria 12/09/2003  . History of transfusion   . Hypertension   . Hypertension associated with diabetes     03/08/2013  . Infectious viral hepatitis   . Malignant neoplasm of breast (female)     04/10/2000  . Moderate persistent asthma (HHS-HCC) 11/27/2013  . Obesity (BMI 30.0-34.9) 04/02/2015   Overview:   Last Assessment & Plan:   Praised patient on weight-loss efforts thus far!   - Referral to weight management program   - Consider future referral to [facility] nutritionist     . Peptic ulceration   . Pneumonia 03/10/2013  . Reflux 01/19/2014  . Type II diabetes mellitus with hypoglycemia     02/17/2003  . 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 Visual impairment   [2] Past Surgical History: Procedure Laterality Date  . BREAST BIOPSY Right    2003  . BREAST BIOPSY Left    2015  . BREAST SURGERY    . CESAREAN SECTION    . CHEMOTHERAPY  2003  . EXPLORATORY LAPAROTOMY    . HYSTERECTOMY  1993   Fibroids  . MASTECTOMY Right 2003  . OOPHORECTOMY  01/27/2004   right ovarian cyst fibroadenoma.  . PR COLONOSCOPY FLX DX W/COLLJ SPEC WHEN PFRMD N/A 05/23/2017   Procedure: COLONOSCOPY, FLEXIBLE, PROXIMAL TO SPLENIC FLEXURE; DIAGNOSTIC, W/WO COLLECTION SPECIMEN BY BRUSH OR WASH;  Surgeon: Thedora Alm Plain, MD;  Location[facility];  Service: Gastroenterology  . PR COLONOSCOPY W/BIOPSY SINGLE/MULTIPLE N/A 05/23/2017   Procedure: COLONOSCOPY, FLEXIBLE, PROXIMAL TO SPLENIC FLEXURE; WITH BIOPSY, SINGLE OR MULTIPLE;  Surgeon: Thedora Alm Plain, MD;  Location: [facility];  Service: Gastroenterology  . PR COLONOSCOPY W/BIOPSY SINGLE/MULTIPLE N/A 01/23/2023   Procedure: COLONOSCOPY, FLEXIBLE, PROXIMAL TO SPLENIC FLEXURE; WITH BIOPSY, SINGLE OR MULTIPLE;  Surgeon: Filbert Rodgers BROCKS, MD;  Location: GI PROCEDURES [facility];  Service: Gastroenterology  . PR UP GI  ENDOSCOPY,BALL DIL,30MM N/A 05/23/2017   Procedure: UGI ENDO; W/BALLOON DILAT ESOPHAGUS (<30MM DIAM);  Surgeon: Thedora Alm Plain, MD;  Location: [facility] PROCEDURES [facility];  Service: Gastroenterology  . PR UP GI ENDOSCOPY,BALL DIL,30MM N/A 02/25/2018   Procedure: UGI ENDO; W/BALLOON DILAT ESOPHAGUS (<30MM DIAM);  Surgeon: Eleanor Dewey Sorrel, MD;  Location: [facility]ED[facility];  Service: Gastroenterology  . PR UP GI ENDOSCOPY,BALL  DIL,30MM N/A 08/30/2021   Procedure: UGI ENDO; W/BALLOON DILAT ESOPHAGUS (<30MM DIAM);  Surgeon: Alphonsa Lav, MD;  Location: [facility] PROCEDURES [facility];  Service: Gastroenterology  . PR UP GI ENDOSCOPY,BALL DIL,30MM N/A 01/23/2023   Procedure: UGI ENDO; W/BALLOON DILAT ESOPHAGUS (<30MM DIAM);  Surgeon: Filbert Rodgers BROCKS, MD;  Location: [facility]URES [facility];  Service: Gastroenterology  . PR UPPER GI ENDOSCOPY,BIOPSY N/A 02/25/2018   Procedure: UGI ENDOSCOPY; WITH BIOPSY, SINGLE OR MULTIPLE;  Surgeon: Eleanor Dewey Sorrel, MD;  Location: [facility] PROCEDURES [facility];  Service: Gastroenterology  . PR UPPER GI ENDOSCOPY,BIOPSY N/A 08/30/2021   Procedure: UGI ENDOSCOPY; WITH BIOPSY, SINGLE OR MULTIPLE;  Surgeon: Alphonsa Lav, MD;  Location: [facility] PROCED[facility];  Service: Gastroenterology  . PR UPPER GI ENDOSCOPY,BIOPSY N/A 01/23/2023   Procedure: UGI ENDOSCOPY; WITH BIOPSY, SINGLE OR MULTIPLE;  Surgeon: Filbert Rodgers BROCKS, MD;  Location: GI PROCEDURES [facility];  Service: Gastroenterology  . PR UPPER GI ENDOSCOPY,DIAGNOSIS N/A 05/23/2017   Procedure: UGI ENDO, INCLUDE ESOPHAGUS, STOMACH, & DUODENUM &/OR JEJUNUM; DX W/WO COLLECTION SPECIMN, BY BRUSH OR WASH;  Surgeon: Thedora Alm Plain, MD;  Location: [facility] PROCEDURES [facility];  Service: Gastroenterology  [3] Current Facility-Administered Medications  Medication Dose Route Frequency Provider Last Rate Last Admin  . magnesium  oxide (MAG-OX) tablet 400 mg  400 mg Oral BID Seilheimer, Waddell HERO, DO   400 mg at  10/09/23 2038   Current Outpatient Medications  Medication Sig Dispense Refill  . albuterol  HFA 90 mcg/actuation inhaler Inhale 2 puffs.    . anastrozole  (ARIMIDEX ) 1 mg tablet Take 1 tablet (1 mg total) by mouth daily.    . atorvastatin  (LIPITOR) 40 MG tablet TAKE 1 TABLET BY MOUTH EVERY DAY 90 tablet 0  . BIOTIN ORAL Take 5,000 mcg by mouth in the morning.    . blood sugar diagnostic (ACCU-CHEK GUIDE TEST STRIPS) Strp TESTING 4 TIMES A DAY , DX:E11.65,Z79.4 (TYPE 2 DM- UNCONTROLLED, INSULIN  DEP) 150 strip 11  . blood-glucose meter kit Disp. blood glucose meter kit preferred by patient's insurance. Check blood sugars as directed by provider. Dx: Diabetes, E11.9 1 each 11  . blood-glucose meter kit Disp. blood glucose meter kit preferred by patient's insurance. Check blood sugars as directed by provider. Dx: Diabetes, E11.9 1 each 11  . budesonide -formoterol  (SYMBICORT ) 160-4.5 mcg/actuation inhaler PLEASE SEE ATTACHED FOR DETAILED DIRECTIONS 10.2 g 6  . carvedilol  (COREG ) 12.5 MG tablet Take 1 tablet (12.5 mg total) by mouth.    . diclofenac sodium (VOLTAREN) 1 % gel Apply 2 g topically four (4) times a day. 100 g 3  . empagliflozin  (JARDIANCE ) 10 mg tablet TAKE 1 TABLET BY MOUTH EVERY DAY 90 tablet 2  . fluticasone  propionate (FLONASE ) 50 mcg/actuation nasal spray 2 sprays into each nostril.    . hydrocortisone (ANUSOL-HC) 2.5 % rectal cream Insert into the rectum two (2) times a day. 30 g 0  . inhalational spacing device (AEROCHAMBER MV) Spcr 1 each by Miscellaneous route continuous. 1 each 0  . insulin  glargine (LANTUS  SOLOSTAR U-100 INSULIN ) 100 unit/mL (3 mL) injection pen Inject 0.5 mL (50 Units total) under the skin nightly. INJECT 56 UNITS UNDER THE SKIN NIGHTLY (Patient taking differently: Inject 0.5 mL (50 Units total) under the skin nightly. INJECT 56 UNITS UNDER THE SKIN NIGHTLY Pt states has been taking this once weekly) 45 mL 3  . lancets Misc Disp. lancets #200 or amount allowed,  Testing QID, Dx: E11.65, Z79.4. (DM type  2 , Insulin  Dep) 200 each 11  . loperamide  (IMODIUM ) 2 mg capsule TAKE 1 CAPSULE (2 MG TOTAL) BY MOUTH 4 TIMES A DAY AS NEEDED FOR DIARRHEA 30 capsule PRN  . magnesium  oxide (MAG-OX) 400 mg (241.3 mg elemental magnesium ) tablet Take 2 tablets (800 mg total) by mouth two (2) times a day. 360 tablet 3  . metoPROLOL  succinate (TOPROL -XL) 25 MG 24 hr tablet Take 1 tablet (25 mg total) by mouth daily. 90 tablet 3  . montelukast  (SINGULAIR ) 10 mg tablet Take 1 tablet (10 mg total) by mouth.    . multivit with calcium ,iron,min (WOMEN'S DAILY MULTIVITAMIN ORAL) Take 1 tablet by mouth daily.    . NIFEdipine  (PROCARDIA  XL) 30 MG 24 hr tablet Take 1 tablet (30 mg total) by mouth daily. TAKE 1 TABLET (30 MG TOTAL) BY MOUTH DAILY.    . ondansetron  (ZOFRAN -ODT) 4 MG disintegrating tablet DISSOLVE 1 TABLET (4 MG TOTAL) IN THE MOUTH EVERY EIGHT (8) HOURS AS NEEDED FOR NAUSEA. 90 tablet 0  . pantoprazole  (PROTONIX ) 40 MG tablet Take 1 tablet (40 mg total) by mouth daily. TAKE 1 TABLET (40 MG TOTAL) BY MOUTH DAILY.    SABRA pen needle, diabetic (BD NANO 2ND GEN PEN NEEDLE) 32 gauge x 5/32 (4 mm) Ndle USE WITH LANTUS  NIGHTLY 100 each 3  . polyethylene glycol (MIRALAX) 17 gram packet Take 17 g by mouth daily.    . sacubitril -valsartan  (ENTRESTO ) 49-51 mg tablet Take 1 tablet by mouth two (2) times a day. 200 tablet 3  . semaglutide (OZEMPIC) 2 mg/dose (8 mg/3 mL) PnIj Inject 2 mg under the skin every seven (7) days. 9 mL 3  . sertraline  (ZOLOFT ) 20 mg/mL concentrated solution Take 10 mL (200 mg total) by mouth daily. 900 mL 3  . spironolactone (ALDACTONE) 50 MG tablet Take 1 tablet (50 mg total) by mouth daily. 90 tablet 3  . triamcinolone  (KENALOG ) 0.5 % ointment APPLY TO AFFECTED AREA TWICE A DAY 60 g 0  [4] Allergies Allergen Reactions  . Metformin Other (See Comments) and Swelling    Unable to walk  . Metformin (Bulk) Swelling and Other (See Comments)    Unable to walk  .  Olopatadine Other (See Comments)    Unknown  . Paroxetine Hcl Rash  [5] Family History Problem Relation Age of Onset  . Breast cancer Maternal Aunt        GREAT AUNT  . Cancer Father        STOMACH CANCER  . Diabetes Mother   . Brain cancer Sister 66  . No Known Problems Brother   . No Known Problems Daughter   . No Known Problems Daughter   . No Known Problems Daughter   . Breast cancer Maternal Aunt   . No Known Problems Brother   . No Known Problems Brother   . No Known Problems Brother   . No Known Problems Brother   . No Known Problems Brother   . No Known Problems Brother   . No Known Problems Brother   . No Known Problems Brother   . No Known Problems Brother   . Diabetes Sister   . No Known Problems Sister   . No Known Problems Maternal Grandmother   . No Known Problems Maternal Grandfather   . No Known Problems Paternal Grandmother   . No Known Problems Paternal Grandfather   . No Known Problems Other   . Colon cancer Neg Hx   . Endometrial cancer  Neg Hx   . Ovarian cancer Neg Hx   . BRCA 1/2 Neg Hx   [6] Social History Tobacco Use  . Smoking status: Former    Current packs/day: 0.00    Average packs/day: 1 pack/day for 10.0 years (10.0 ttl pk-yrs)    Types: Cigarettes    Start date: 11/15/1974    Quit date: 11/14/1984    Years since quitting: 38.9  . Smokeless tobacco: Never  . Tobacco comments:    Stopped smoking at age 41  Vaping Use  . Vaping status: Never Used  Substance Use Topics  . Alcohol use: Not Currently    Comment: Quit in 1986; was an alcoholic - wine cooler or beer occasionally  . Drug use: Not Currently   Malka Waddell HERO, DO Resident 10/09/23 2126

## 2023-10-11 ENCOUNTER — Encounter: Payer: Self-pay | Admitting: Internal Medicine

## 2023-10-11 ENCOUNTER — Inpatient Hospital Stay (HOSPITAL_BASED_OUTPATIENT_CLINIC_OR_DEPARTMENT_OTHER): Payer: 59 | Admitting: Internal Medicine

## 2023-10-11 ENCOUNTER — Inpatient Hospital Stay: Payer: 59 | Attending: Internal Medicine

## 2023-10-11 VITALS — BP 124/64 | HR 78 | Temp 98.7°F | Resp 18 | Ht 61.0 in | Wt 156.8 lb

## 2023-10-11 DIAGNOSIS — D0512 Intraductal carcinoma in situ of left breast: Secondary | ICD-10-CM | POA: Diagnosis not present

## 2023-10-11 DIAGNOSIS — Z79811 Long term (current) use of aromatase inhibitors: Secondary | ICD-10-CM | POA: Insufficient documentation

## 2023-10-11 DIAGNOSIS — Z9011 Acquired absence of right breast and nipple: Secondary | ICD-10-CM | POA: Diagnosis not present

## 2023-10-11 DIAGNOSIS — M7989 Other specified soft tissue disorders: Secondary | ICD-10-CM | POA: Insufficient documentation

## 2023-10-11 DIAGNOSIS — Z87891 Personal history of nicotine dependence: Secondary | ICD-10-CM | POA: Insufficient documentation

## 2023-10-11 DIAGNOSIS — Z923 Personal history of irradiation: Secondary | ICD-10-CM | POA: Diagnosis not present

## 2023-10-11 DIAGNOSIS — M8580 Other specified disorders of bone density and structure, unspecified site: Secondary | ICD-10-CM | POA: Diagnosis not present

## 2023-10-11 DIAGNOSIS — Z170 Estrogen receptor positive status [ER+]: Secondary | ICD-10-CM | POA: Diagnosis not present

## 2023-10-11 NOTE — Progress Notes (Signed)
 Was seen at [facility] for chest pains 10/09/23.  Pt stated she was in the ER last night and got stuck too many times, she also did not hydrate properly for her blood work today. she doesn't want to get he lab tried to be drawn again.  She is feeling dizzy and off balance today. B/l  ear pain, unc didn't look at her ears she states. She states she is still having chest pains now.

## 2023-10-11 NOTE — Progress Notes (Signed)
 [facility] CONSULT NOTE  Patient Care Team: Jeri Donnice SAUNDERS, MD as PCP - General (Obstetrics and Gynecology) Rennie Cindy SAUNDERS, MD as Consulting Physician (Oncology) Lenn Aran, MD as Consulting Physician (Radiation Oncology) Tamea Dedra CROME, MD as Consulting Physician (Pulmonary Disease)  CHIEF COMPLAINTS/PURPOSE OF CONSULTATION: DCIS.   Oncology History Overview Note   # DCIS LEFT [MAY 2023- s/p WBRT [Dr.Crystal.]; ER POSITIVE; JUNE 2023-start anastrozole .  # hx of breast cancer s/p mastectomy [[facility]]  # THROMBOCYTOSIS [NOv 2021 605 -platelets- ; Hb;10.5 white count-WNL]; MARCH 2022-JAK-2NEG; CALR-NEG.BCR-ABL-NEGATIVE.     Ductal carcinoma in situ (DCIS) of left breast  04/15/2015 Initial Diagnosis   Ductal carcinoma in situ (DCIS) of left breast   05/08/2021 Cancer Staging   Staging form: Breast, AJCC 7th Edition - Clinical stage from 05/08/2021: Stage 0 (Tis (DCIS), N0, M0) - Signed by Jacobo Evalene PARAS, MD on 05/08/2021    HISTORY OF PRESENTING ILLNESS: Patient ambulating-independently with assistance.  Alone.  Gail Stevenson 68 y.o.  female pleasant prior history of breast cancer on the right side-s/p mastectomy; and currently with left DCIS s/p radiation  is here for follow-up.  Pt stated she was in the ER last night and got stuck too many times, she also did not hydrate properly for her blood work today. she doesn't want to get he lab tried to be drawn again.  Patient has chronic leg swelling not any worse.   Denies any nausea vomiting.  Denies any fevers or chills.   Review of Systems  Constitutional:  Positive for malaise/fatigue. Negative for chills, diaphoresis, fever and weight loss.  HENT:  Negative for nosebleeds and sore throat.   Eyes:  Negative for double vision.  Respiratory:  Negative for cough, hemoptysis, sputum production, shortness of breath and wheezing.   Cardiovascular:  Negative for chest pain, palpitations, orthopnea  and leg swelling.  Gastrointestinal:  Negative for abdominal pain, blood in stool, constipation, diarrhea, heartburn, melena, nausea and vomiting.  Genitourinary:  Negative for dysuria, frequency and urgency.  Musculoskeletal:  Positive for back pain and joint pain.  Skin: Negative.  Negative for itching and rash.  Neurological:  Negative for dizziness, tingling, focal weakness, weakness and headaches.  Endo/Heme/Allergies:  Does not bruise/bleed easily.  Psychiatric/Behavioral:  Negative for depression. The patient is not nervous/anxious and does not have insomnia.      MEDICAL HISTORY:  Past Medical History:  Diagnosis Date   Asthma    Breast cancer (HCC)    Bronchitis    Diabetes mellitus without complication (HCC)    Type 2   Diastolic dysfunction    Diverticulitis    Esophageal varices without bleeding (HCC)    Fothergill's neuralgia    Hemorrhoids    Hepatitis C    History of breast cancer 2003   right side total mastectomy- DCIS   Hypertension    Leg swelling    Leukocytosis    LVH (left ventricular hypertrophy)    Morbid obesity (HCC)    Osteopenia    Personal history of chemotherapy    Personal history of radiation therapy    Sepsis (HCC)    SIRS (systemic inflammatory response syndrome) (HCC)    Thrombocytosis    Trigeminal neuralgia     SURGICAL HISTORY: Past Surgical History:  Procedure Laterality Date   BREAST BIOPSY Left 03/14/2021   us  bx ribbon marker,differential diagnosisi of intraductal papilloma with ADH or DCIS   BREAST LUMPECTOMY WITH RADIOFREQUENCY TAG IDENTIFICATION Left 04/27/2021   Procedure:  BREAST LUMPECTOMY WITH RADIOFREQUENCY TAG IDENTIFICATION;  Surgeon: Desiderio Schanz, MD;  Location: [facility] ORS;  Service: General;  Laterality: Left;   BREAST SURGERY Right 2003   total mastectomy   COLONOSCOPY  2019   LAPAROTOMY N/A 01/25/2019   Procedure: EXPLORATORY LAPAROTOMY, ILEOCECECTOMY;  Surgeon: Sebastian Moles, MD;  Location: [facility] OR;  Service:  General;  Laterality: N/A;   MASTECTOMY Right 2003   total   UPPER GI ENDOSCOPY      SOCIAL HISTORY: Social History   Socioeconomic History   Marital status: Divorced    Spouse name: Not on file   Number of children: 1   Years of education: Not on file   Highest education level: Not on file  Occupational History   Not on file  Tobacco Use   Smoking status: Former    Current packs/day: 0.00    Average packs/day: 0.5 packs/day for 10.0 years (5.0 ttl pk-yrs)    Types: Cigarettes    Start date: 05/18/1982    Quit date: 05/17/1992    Years since quitting: 31.4   Smokeless tobacco: Never  Vaping Use   Vaping status: Never Used  Substance and Sexual Activity   Alcohol use: Not Currently    Comment: Occasional beer   Drug use: No   Sexual activity: Yes  Other Topics Concern   Not on file  Social History Narrative   Lives with deceased daughter's boyfriend and ex-husband in [place]; Daughter assists in medical care. Hx of smoking; recovering alcohol. Used to work-house keeping/construction.    Social Drivers of Health   Financial Resource Strain: High Risk (11/06/2021)   Overall Financial Resource Strain (CARDIA)    Difficulty of Paying Living Expenses: Hard  Food Insecurity: No Food Insecurity (06/14/2022)   Hunger Vital Sign    Worried About Running Out of Food in the Last Year: Never true    Ran Out of Food in the Last Year: Never true  Transportation Needs: No Transportation Needs (06/14/2022)   PRAPARE - Administrator, Civil Service (Medical): No    Lack of Transportation (Non-Medical): No  Recent Concern: Transportation Needs - Unmet Transportation Needs (05/01/2022)   PRAPARE - Transportation    Lack of Transportation (Medical): Yes    Lack of Transportation (Non-Medical): Yes  Physical Activity: Insufficiently Active (11/06/2021)   Exercise Vital Sign    Days of Exercise per Week: 3 days    Minutes of Exercise per Session: 30 min  Stress: Stress Concern  Present (11/06/2021)   [employer] of Occupational Health - Occupational Stress Questionnaire    Feeling of Stress : To some extent  Social Connections: Socially Integrated (11/06/2021)   Social Connection and Isolation Panel    Frequency of Communication with Friends and Family: More than three times a week    Frequency of Social Gatherings with Friends and Family: More than three times a week    Attends Religious Services: More than 4 times per year    Active Member of [employer] or Organizations: Yes    Attends Banker Meetings: 1 to 4 times per year    Marital Status: Married  Catering manager Violence: Not At Risk (10/09/2023)   Received from [facility]   Humiliation, Afraid, Rape, and Kick questionnaire    Within the last year, have you been afraid of your partner or ex-partner?: No    Within the last year, have you been humiliated or emotionally abused in other ways by your partner or  ex-partner?: No    Within the last year, have you been kicked, hit, slapped, or otherwise physically hurt by your partner or ex-partner?: No    Within the last year, have you been raped or forced to have any kind of sexual activity by your partner or ex-partner?: No    FAMILY HISTORY: Family History  Problem Relation Age of Onset   Gastric cancer Father 75   Breast cancer Maternal Aunt    Breast cancer Daughter 54   Stomach cancer Other    Hypertension Neg Hx    Stroke Neg Hx     ALLERGIES:  is allergic to metformin, patanol [olopatadine], and paroxetine hcl.  MEDICATIONS:  Current Outpatient Medications  Medication Sig Dispense Refill   acetaminophen  (TYLENOL ) 500 MG tablet Take 2 tablets (1,000 mg total) by mouth every 6 (six) hours as needed for mild pain.     albuterol  (VENTOLIN  HFA) 108 (90 Base) MCG/ACT inhaler Inhale 2 puffs into the lungs every 4 (four) hours as needed for wheezing or shortness of breath. 8.5 each 2   anastrozole  (ARIMIDEX ) 1 MG tablet Take 1 tablet  (1 mg total) by mouth daily. 90 tablet 3   atorvastatin  (LIPITOR) 40 MG tablet Take 1 tablet (40 mg total) by mouth at bedtime. 30 tablet 2   carvedilol  (COREG ) 12.5 MG tablet Take 1 tablet (12.5 mg total) by mouth 2 (two) times daily. 30 tablet 2   empagliflozin  (JARDIANCE ) 10 MG TABS tablet Take 1 tablet (10 mg total) by mouth daily. 30 tablet 2   ENTRESTO  24-26 MG Take 1 tablet by mouth 2 (two) times daily. 60 tablet 2   fluconazole  (DIFLUCAN ) 40 MG/ML suspension Take 100 mg by mouth daily.     fluticasone  (FLONASE ) 50 MCG/ACT nasal spray Place 2 sprays into both nostrils daily. 48 mL 2   LANTUS  SOLOSTAR 100 UNIT/ML Solostar Pen Inject 56 Units into the skin at bedtime. 15 mL 2   loperamide  (IMODIUM ) 2 MG capsule Take 1 capsule (2 mg total) by mouth 4 (four) times daily as needed for diarrhea or loose stools. 30 capsule 2   magnesium  oxide (MAG-OX) 400 MG tablet Take 800 mg by mouth 2 (two) times daily.     montelukast  (SINGULAIR ) 10 MG tablet Take 1 tablet (10 mg total) by mouth at bedtime. 30 tablet 2   Multiple Vitamin (MULTIVITAMIN WITH MINERALS) TABS tablet Take 4 tablets by mouth daily.     NIFEdipine  (PROCARDIA -XL/NIFEDICAL-XL) 30 MG 24 hr tablet Take 1 tablet (30 mg total) by mouth daily. 30 tablet 2   ondansetron  (ZOFRAN -ODT) 4 MG disintegrating tablet Take 1 tablet by mouth every 8 (eight) hours as needed.     pantoprazole  (PROTONIX ) 40 MG tablet Take 1 tablet (40 mg total) by mouth daily. 90 tablet 1   sertraline  (ZOLOFT ) 20 MG/ML concentrated solution Take 5 mLs (100 mg total) by mouth daily. 60 mL 2   SYMBICORT  160-4.5 MCG/ACT inhaler Inhale 1 puff into the lungs daily. 1 each 2   gabapentin  (NEURONTIN ) 100 MG capsule Take 1 capsule (100 mg total) by mouth 2 (two) times daily. 30 capsule 2   No current facility-administered medications for this visit.     PHYSICAL EXAMINATION:   Vitals:   10/11/23 1420  BP: 124/64  Pulse: 78  Resp: 18  Temp: 98.7 F (37.1 C)  SpO2:  100%    Filed Weights   10/11/23 1420  Weight: 71.1 kg    Physical Exam Constitutional:  Comments: Ambulating independently.   HENT:     Head: Normocephalic and atraumatic.     Mouth/Throat:     Pharynx: No oropharyngeal exudate.  Eyes:     Pupils: Pupils are equal, round, and reactive to light.  Cardiovascular:     Rate and Rhythm: Normal rate and regular rhythm.  Pulmonary:     Effort: Pulmonary effort is normal. No respiratory distress.     Breath sounds: Normal breath sounds. No wheezing.  Abdominal:     General: Bowel sounds are normal. There is no distension.     Palpations: Abdomen is soft. There is no mass.     Tenderness: There is no abdominal tenderness. There is no guarding or rebound.  Musculoskeletal:        General: No tenderness. Normal range of motion.     Cervical back: Normal range of motion and neck supple.  Skin:    General: Skin is warm.  Neurological:     Mental Status: She is alert and oriented to person, place, and time.  Psychiatric:        Mood and Affect: Affect normal.      LABORATORY DATA:  I have reviewed the data as listed Lab Results  Component Value Date   WBC 15.4 (H) 08/24/2023   HGB 12.2 08/24/2023   HCT 37.8 08/24/2023   MCV 91.5 08/24/2023   PLT 371 08/24/2023   Recent Labs    01/25/23 1137 04/12/23 1452 07/16/23 1627 08/24/23 0917  NA 134* 137 140 137  K 3.5 3.8 3.9 3.9  CL 100 98 102 100  CO2 24 28  --  27  GLUCOSE 156* 172* 145* 204*  BUN 13 16 20 14   CREATININE 1.29* 1.31* 1.40* 1.09*  CALCIUM  8.5* 9.4  --  9.8  GFRNONAA 45* 44*  --  55*  PROT 7.1 7.2  --  7.2  ALBUMIN  3.7 3.8  --  3.0*  AST 21 19  --  17  ALT 18 19  --  14  ALKPHOS 86 83  --  52  BILITOT 0.4 0.7  --  1.1  BILIDIR <0.1  --   --  0.2  IBILI NOT CALCULATED  --   --  0.9     No results found.    Ductal carcinoma in situ (DCIS) of left breast # LEFT BREAST DCIS- ER POSITIVE s/p Lumpectomty [s/p WBRT]; continue Anastrazole  1mg /day. Tolerating well except for joint pain. UNI LAT LEFT [Dr.Piscoya] Mammogram- MARCH 2025- - assymetry noted-  awaiting sep  2025- Mammo/US .   # Hot flashes- G-2- sec to Arimidex - on Zoloft - stable.   # Hx of right breast cancer/DICS [[facility]] s/p mastectomy- Clinically no evidence of recurrence. stable.   # CKD stage-III- stable- #Hypertension-well controlled-  stable.    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 Monitor for now.  stable.   # 2023- BMD- Osteopenia- T score: - 1.7- continue ca+ vit D.  Check BMD in  2026. Will order next visit.   # intermittent-chest pain- work up [employer]- negative- CT AP- NEG- ? MSK- defer to PCP.   # DISPOSITION: # follow up in 6 months- MD: labs- cbc/cmp; vit D 25-OH-  Dr.B       Cindy JONELLE Joe, MD 10/11/2023 3:46 PM

## 2023-10-11 NOTE — Assessment & Plan Note (Addendum)
#   LEFT BREAST DCIS- ER POSITIVE s/p Lumpectomty [s/p WBRT]; continue Anastrazole 1mg /day. Tolerating well except for joint pain. UNI LAT LEFT [Dr.Piscoya] Mammogram- MARCH 2025- - assymetry noted-  awaiting sep  2025- Mammo/US .   # Hot flashes- G-2- sec to Arimidex - on Zoloft - stable.   # Hx of right breast cancer/DICS [[facility]] s/p mastectomy- Clinically no evidence of recurrence. stable.   # CKD stage-III- stable- #Hypertension-well controlled-  stable.    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 Monitor for now.  stable.   # 2023- BMD- Osteopenia- T score: - 1.7- continue ca+ vit D.  Check BMD in  2026. Will order next visit.   # intermittent-chest pain- work up [employer]- negative- CT AP- NEG- ? MSK- defer to PCP.   # DISPOSITION: # follow up in 6 months- MD: labs- cbc/cmp; vit D 25-OH-  Dr.B

## 2023-10-23 ENCOUNTER — Ambulatory Visit: Payer: 59 | Admitting: Radiation Oncology

## 2023-10-24 ENCOUNTER — Other Ambulatory Visit

## 2023-10-24 ENCOUNTER — Inpatient Hospital Stay: Admission: RE | Admit: 2023-10-24 | Source: Ambulatory Visit

## 2023-10-25 IMAGING — CR DG CHEST 2V
1 series · 2 of 2 positions shown · non-contrast
Comparison: Radiograph June 16, 2021

CLINICAL DATA: Chest pain shortness of breath history of asthma.

EXAM:
CHEST - 2 VIEW

[Series 1: dg chest 2 view · 0.14mm/px · 2 of 2 slices shown]
[im 1/2]
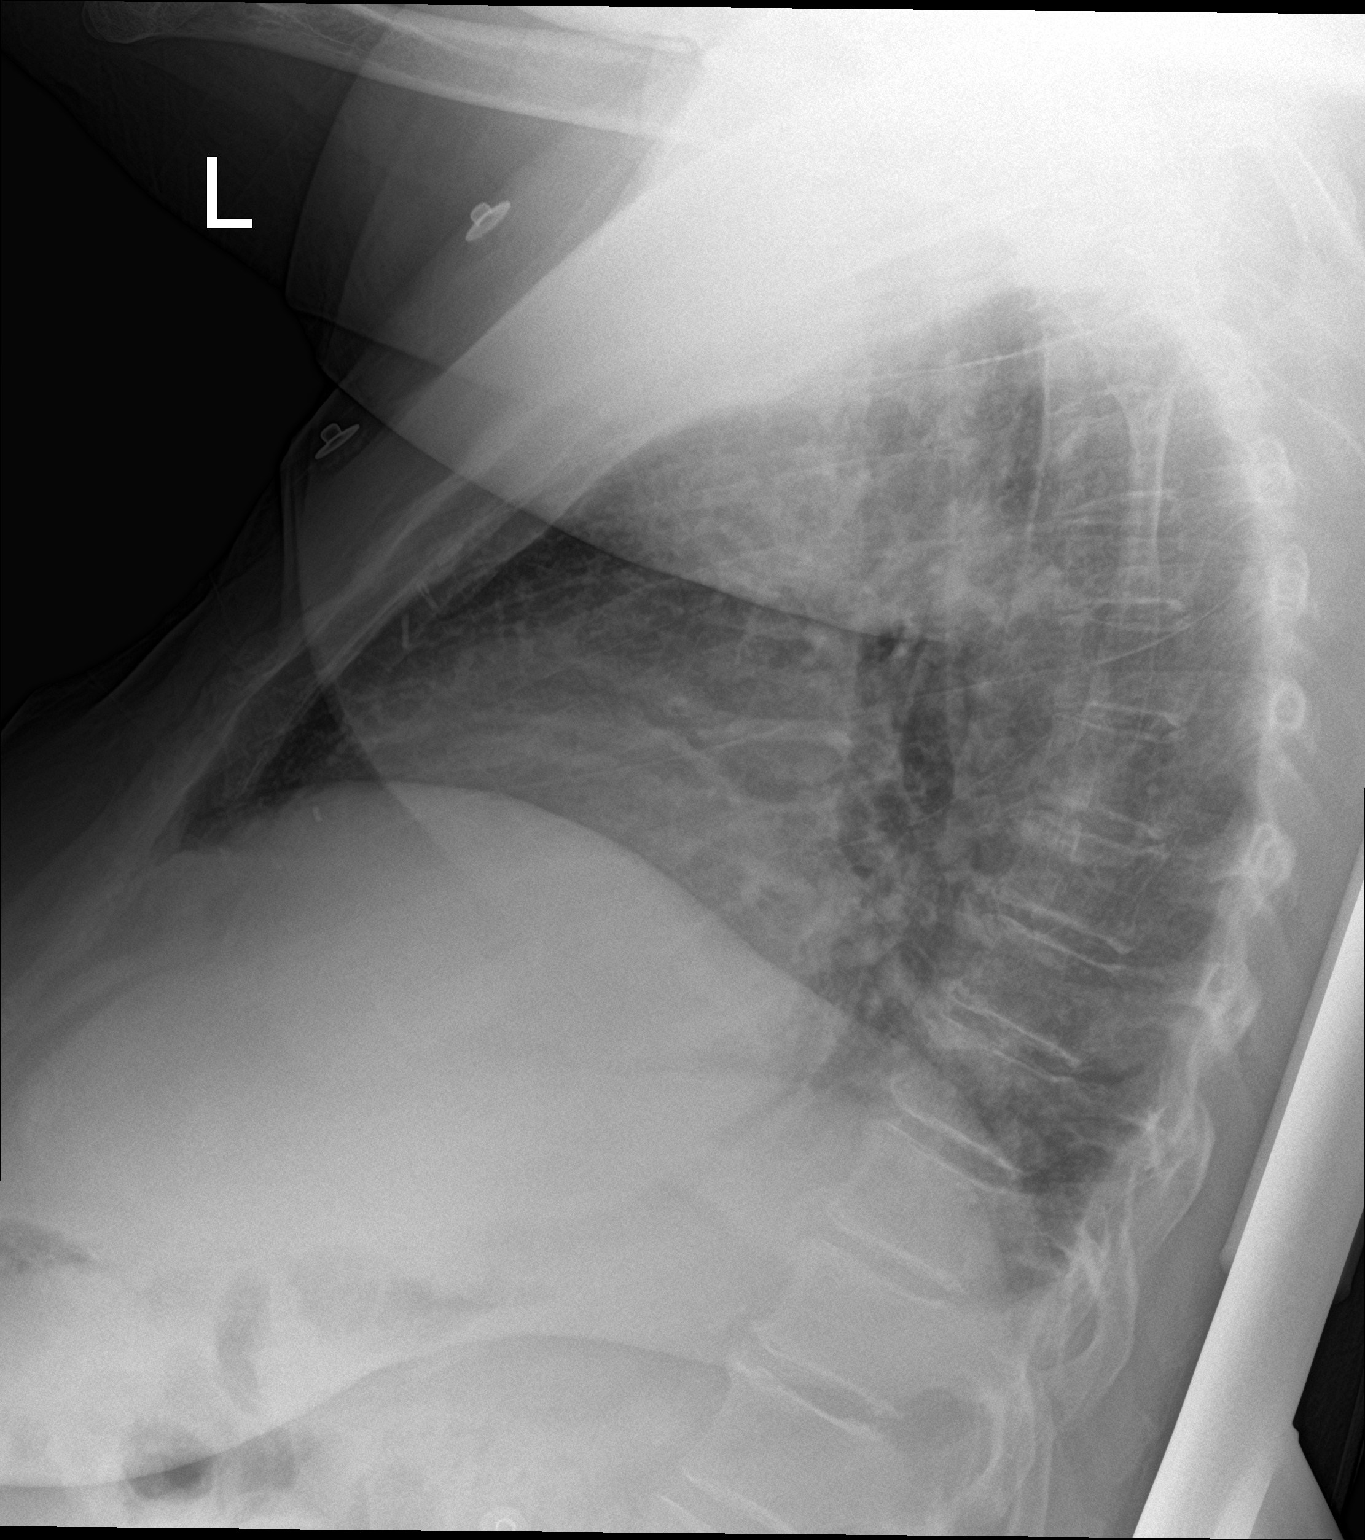
[im 2/2]
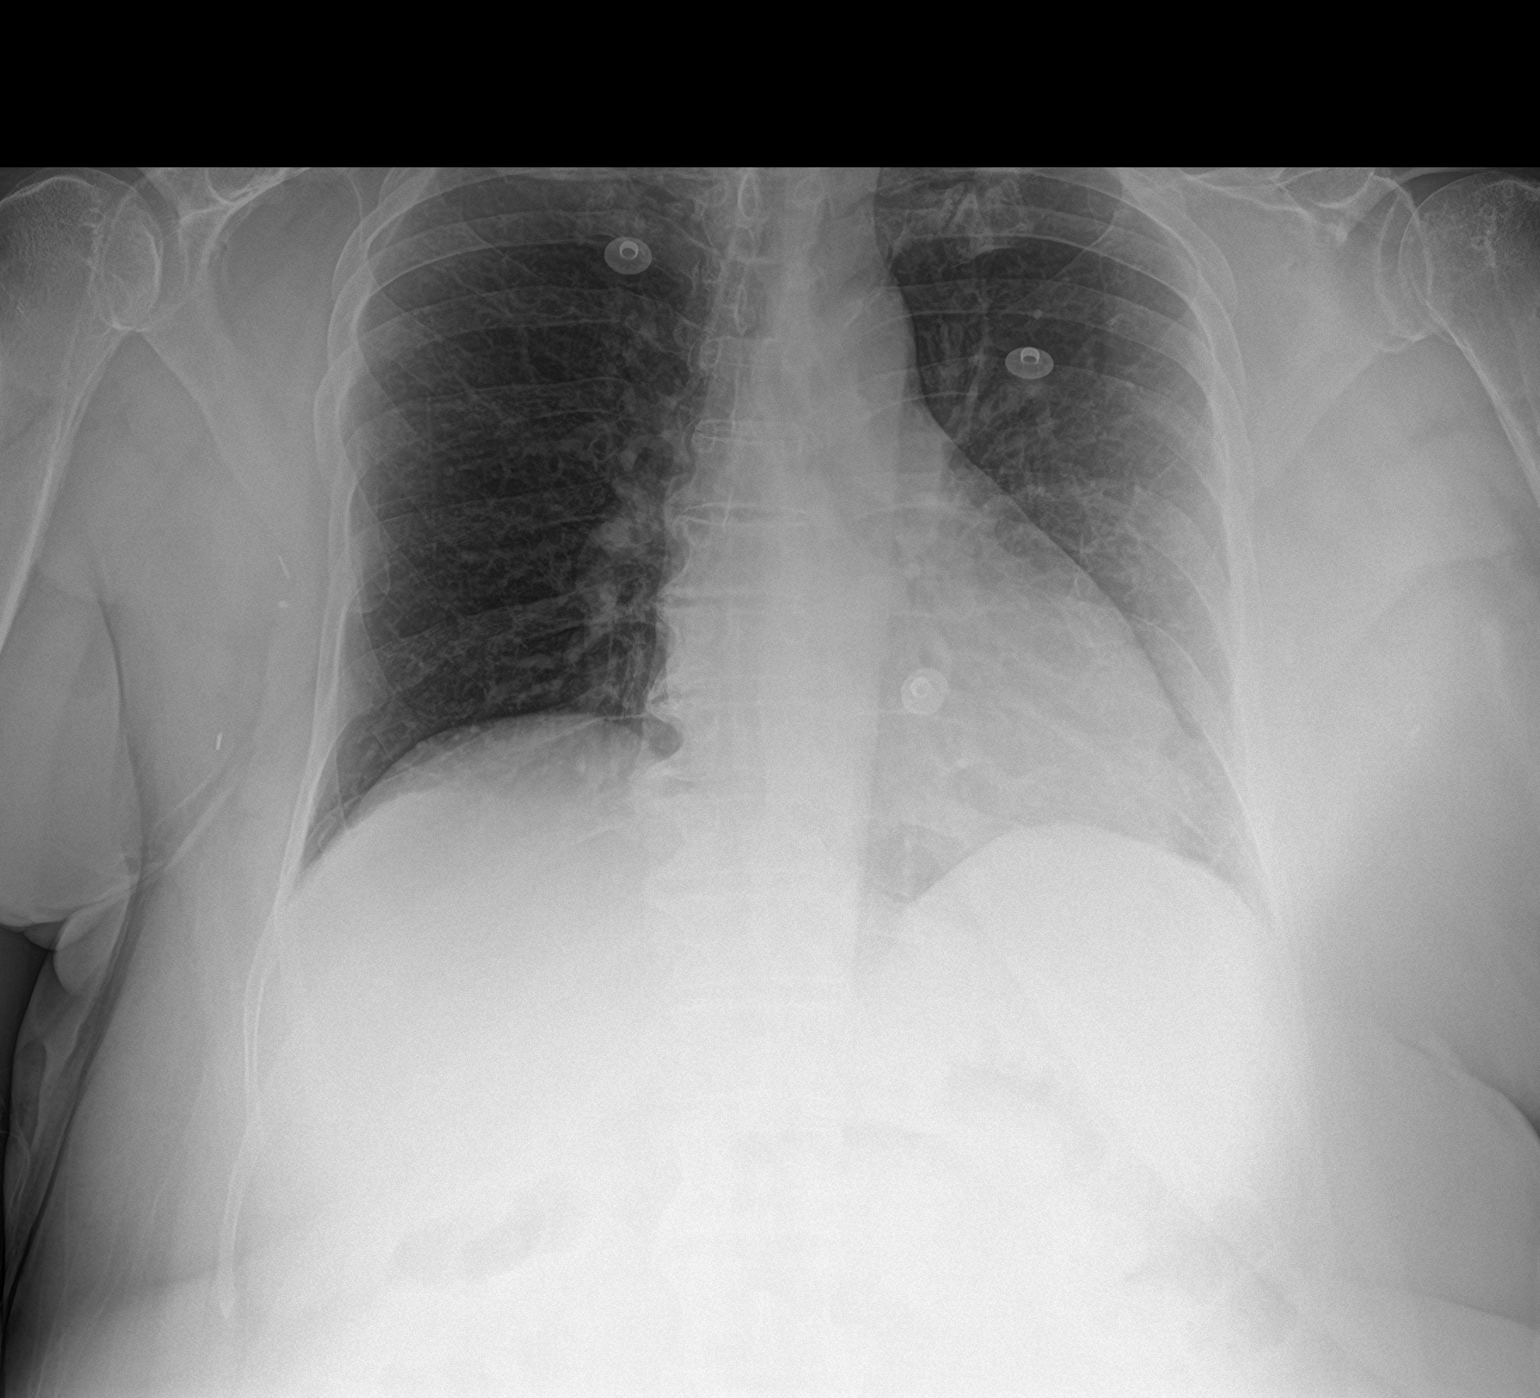

[2 of 2 positions shown; findings below may reference images not displayed]

FINDINGS: The heart size and mediastinal contours are within normal limits. No
focal airspace consolidation. No visible pleural effusion or
pneumothorax. Surgical clips project over the right axilla. Thoracic
spondylosis.
IMPRESSION: No acute cardiopulmonary disease.

## 2023-10-31 ENCOUNTER — Ambulatory Visit
Admission: RE | Admit: 2023-10-31 | Discharge: 2023-10-31 | Disposition: A | Discharge status: Home / Self Care | Source: Ambulatory Visit | Attending: Surgery | Admitting: Surgery

## 2023-10-31 DIAGNOSIS — D0512 Intraductal carcinoma in situ of left breast: Secondary | ICD-10-CM | POA: Diagnosis present

## 2023-11-01 ENCOUNTER — Ambulatory Visit: Admitting: Surgery

## 2023-11-01 ENCOUNTER — Other Ambulatory Visit: Payer: Self-pay

## 2023-11-01 ENCOUNTER — Other Ambulatory Visit: Payer: Self-pay | Admitting: Surgery

## 2023-11-01 DIAGNOSIS — R928 Other abnormal and inconclusive findings on diagnostic imaging of breast: Secondary | ICD-10-CM

## 2023-11-01 DIAGNOSIS — D0512 Intraductal carcinoma in situ of left breast: Secondary | ICD-10-CM

## 2023-11-01 DIAGNOSIS — N63 Unspecified lump in unspecified breast: Secondary | ICD-10-CM

## 2023-11-01 DIAGNOSIS — N6320 Unspecified lump in the left breast, unspecified quadrant: Secondary | ICD-10-CM

## 2023-11-01 DIAGNOSIS — N6489 Other specified disorders of breast: Secondary | ICD-10-CM

## 2023-11-06 ENCOUNTER — Ambulatory Visit
Admission: RE | Admit: 2023-11-06 | Discharge: 2023-11-06 | Disposition: A | Discharge status: Home / Self Care | Source: Ambulatory Visit | Attending: Surgery | Admitting: Surgery

## 2023-11-06 ENCOUNTER — Ambulatory Visit
Admission: RE | Admit: 2023-11-06 | Discharge: 2023-11-06 | Disposition: A | Discharge status: Home / Self Care | Source: Ambulatory Visit | Attending: Surgery

## 2023-11-06 DIAGNOSIS — N63 Unspecified lump in unspecified breast: Secondary | ICD-10-CM | POA: Diagnosis present

## 2023-11-06 DIAGNOSIS — N6032 Fibrosclerosis of left breast: Secondary | ICD-10-CM | POA: Insufficient documentation

## 2023-11-06 DIAGNOSIS — R928 Other abnormal and inconclusive findings on diagnostic imaging of breast: Secondary | ICD-10-CM | POA: Insufficient documentation

## 2023-11-06 HISTORY — PX: BREAST BIOPSY: SHX20

## 2023-11-06 MED ORDER — LIDOCAINE 1 % OPTIME INJ - NO CHARGE
1.0000 mL | Freq: Once | INTRAMUSCULAR | Status: AC
  Administered 2023-11-06: 1 mL
  Filled 2023-11-06: qty 2

## 2023-11-06 MED ORDER — LIDOCAINE-EPINEPHRINE 1 %-1:100000 IJ SOLN
5.0000 mL | Freq: Once | INTRAMUSCULAR | Status: AC
  Administered 2023-11-06: 5 mL

## 2023-11-07 LAB — SURGICAL PATHOLOGY

## 2023-11-08 ENCOUNTER — Encounter: Payer: Self-pay | Admitting: Internal Medicine

## 2023-11-08 ENCOUNTER — Encounter: Payer: Self-pay | Admitting: Radiation Oncology

## 2023-11-08 ENCOUNTER — Telehealth: Payer: Self-pay | Admitting: Radiation Oncology

## 2023-11-08 NOTE — Telephone Encounter (Signed)
 Pt called to confirm appts - requested appt reminders sent out via mail - mailed out reminders - [facility]

## 2023-11-11 ENCOUNTER — Ambulatory Visit: Admitting: Surgery

## 2023-11-15 ENCOUNTER — Ambulatory Visit: Admitting: Surgery

## 2023-11-21 ENCOUNTER — Encounter: Payer: Self-pay | Admitting: Radiation Oncology

## 2023-11-21 ENCOUNTER — Ambulatory Visit
Admission: RE | Admit: 2023-11-21 | Discharge: 2023-11-21 | Disposition: A | Discharge status: Home / Self Care | Source: Ambulatory Visit | Attending: Radiation Oncology | Admitting: Radiation Oncology

## 2023-11-21 VITALS — BP 132/81 | HR 93 | Temp 98.3°F | Resp 16 | Wt 163.0 lb

## 2023-11-21 DIAGNOSIS — D0512 Intraductal carcinoma in situ of left breast: Secondary | ICD-10-CM | POA: Diagnosis present

## 2023-11-21 DIAGNOSIS — Z79811 Long term (current) use of aromatase inhibitors: Secondary | ICD-10-CM | POA: Insufficient documentation

## 2023-11-21 DIAGNOSIS — Z923 Personal history of irradiation: Secondary | ICD-10-CM | POA: Diagnosis present

## 2023-11-21 DIAGNOSIS — Z170 Estrogen receptor positive status [ER+]: Secondary | ICD-10-CM | POA: Diagnosis not present

## 2023-11-21 NOTE — Progress Notes (Signed)
 Radiation Oncology Follow up Note  Name: Gail Stevenson   Date:   11/21/2023 MRN:  [id number] DOB: 04-Oct-1955    This 68 y.o. female presents to the clinic today for 2-year follow-up status post whole breast radiation to her left breast for ER positive ductal carcinoma in situ.  REFERRING PROVIDER: Jeri Donnice SAUNDERS, MD  HPI: Patient is a 68 year old female now out over 2 years having completed whole breast radiation to her left breast for ER positive ductal carcinoma in situ.  Seen today in routine follow-up she is doing well.  She specifically denies breast tenderness cough or bone pain.  Patient has had a right mastectomy..  She had a recent mammogram last month showing stable probably benign asymmetry of the posterior left mid breast which has been stable for 1 year.  She does have an indeterminant 6 mm mass over the 2 o'clock position of the left breast.  This area was recently biopsied and shown to be fat necrosis with associate inflammation and fibrosis negative for carcinoma.  Patient is currently on Arimidex  tolerating that well without side effect  COMPLICATIONS OF TREATMENT: none  FOLLOW UP COMPLIANCE: keeps appointments   PHYSICAL EXAM:  BP 132/81   Pulse 93   Temp 98.3 F (36.8 C) (Tympanic)   Resp 16   Wt 163 lb (73.9 kg)   BMI 30.80 kg/m  Patient status post right modified radical mastectomy chest wall is clear without evidence of mass or nodularity left breast is free of dominant mass no axillary or supraclavicular adenopathy is appreciated.  Well-developed well-nourished patient in NAD. HEENT reveals PERLA, EOMI, discs not visualized.  Oral cavity is clear. No oral mucosal lesions are identified. Neck is clear without evidence of cervical or supraclavicular adenopathy. Lungs are clear to A&P. Cardiac examination is essentially unremarkable with regular rate and rhythm without murmur rub or thrill. Abdomen is benign with no organomegaly or masses noted. Motor sensory and  DTR levels are equal and symmetric in the upper and lower extremities. Cranial nerves II through XII are grossly intact. Proprioception is intact. No peripheral adenopathy or edema is identified. No motor or sensory levels are noted. Crude visual fields are within normal range.  RADIOLOGY RESULTS: Mammograms reviewed CT scan of abdomen pelvis performed for colonic diverticulosis also reviewed  PLAN: Present time patient is doing well now out over 2 years with no evidence of disease of her ductal carcinoma in situ.  And pleased with her overall progress.  I am going to turn follow-up care over to medical oncology.  I be happy to reevaluate the patient anytime in the future should that be indicated.  I would like to take this opportunity to thank you for allowing me to participate in the care of your patient.SABRA Marcey Penton, MD

## 2023-11-27 ENCOUNTER — Encounter: Payer: Self-pay | Admitting: Surgery

## 2023-11-27 ENCOUNTER — Ambulatory Visit: Admitting: Surgery

## 2023-11-27 VITALS — BP 114/75 | HR 90 | Ht 61.0 in | Wt 162.0 lb

## 2023-11-27 DIAGNOSIS — D0512 Intraductal carcinoma in situ of left breast: Secondary | ICD-10-CM

## 2023-11-27 DIAGNOSIS — N641 Fat necrosis of breast: Secondary | ICD-10-CM

## 2023-11-27 NOTE — Patient Instructions (Signed)
 We will see you back in one year for a follow left breast mammogram and a follow up to see Dr Desiderio. Our schedule is not yet available, so we will send you out a letter with a date and time for your mammogram and to see Dr Desiderio.

## 2023-11-27 NOTE — Progress Notes (Signed)
 11/27/2023  History of Present Illness: Gail Stevenson is a 68 y.o. 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  Patient completed radiation and is currently on Arimidex .  She had her last left breast mammogram on 10/31/2023.  She had a mammogram of the left breast on 10/23/2022 which showed a probably benign asymmetry in the left breast and this was followed by a repeat mammogram on 04/24/2023.  At that time, a new area in the left breast was also identified which was followed on the last mammogram a month ago.  This showed that the area had increased in size.  She had a biopsy of this area on 11/02/2023 which showed fat necrosis.  Patient reports that she has been doing well and denies any worsening issues in the left breast.  She has had intermittent pain in the left breast but this has been present since her surgery.  Past Medical History: Past Medical History:  Diagnosis Date   Asthma    Breast cancer (HCC)    Bronchitis    Diabetes mellitus without complication (HCC)    Type 2   Diastolic dysfunction    Diverticulitis    Esophageal varices without bleeding (HCC)    Fothergill's neuralgia    Hemorrhoids    Hepatitis C    History of breast cancer 2003   right side total mastectomy- DCIS   Hypertension    Leg swelling    Leukocytosis    LVH (left ventricular hypertrophy)    Morbid obesity (HCC)    Osteopenia    Personal history of chemotherapy    Personal history of radiation therapy    Sepsis (HCC)    SIRS (systemic inflammatory response syndrome) (HCC)    Thrombocytosis    Trigeminal neuralgia      Past Surgical History: Past Surgical History:  Procedure Laterality Date   BREAST BIOPSY Left 03/14/2021   us  bx ribbon marker,differential diagnosisi of intraductal papilloma with ADH or DCIS   BREAST BIOPSY Left 11/06/2023   US  LT BREAST BX W LOC DEV 1ST LESION IMG BX SPEC US  GUIDE 11/06/2023 [facility]   BREAST LUMPECTOMY WITH RADIOFREQUENCY TAG  IDENTIFICATION Left 04/27/2021   Procedure: BREAST LUMPECTOMY WITH RADIOFREQUENCY TAG IDENTIFICATION;  Surgeon: Desiderio Schanz, MD;  Location: [facility] ORS;  Service: General;  Laterality: Left;   BREAST SURGERY Right 2003   total mastectomy   COLONOSCOPY  2019   LAPAROTOMY N/A 01/25/2019   Procedure: EXPLORATORY LAPAROTOMY, ILEOCECECTOMY;  Surgeon: Sebastian Moles, MD;  Location: [facility] OR;  Service: General;  Laterality: N/A;   MASTECTOMY Right 2003   total   UPPER GI ENDOSCOPY     US  LT BREAST BX W LOC DEV 1ST LESION IMG BX SPEC US  GUIDE Left    US  BX LEFT BREAST COIL CLIP-PATH PENDING    Home Medications: Prior to Admission medications   Medication Sig Start Date End Date Taking? Authorizing Provider  acetaminophen  (TYLENOL ) 500 MG tablet Take 2 tablets (1,000 mg total) by mouth every 6 (six) hours as needed for mild pain. 04/27/21   Desiderio Schanz, MD  albuterol  (VENTOLIN  HFA) 108 (90 Base) MCG/ACT inhaler Inhale 2 puffs into the lungs every 4 (four) hours as needed for wheezing or shortness of breath. 07/16/23   Yolande Lamar BROCKS, MD  anastrozole  (ARIMIDEX ) 1 MG tablet Take 1 tablet (1 mg total) by mouth daily. 07/16/23   Yolande Lamar BROCKS, MD  atorvastatin  (LIPITOR) 40 MG tablet Take 1 tablet (40 mg total) by mouth  at bedtime. 07/16/23   Yolande Lamar BROCKS, MD  carvedilol  (COREG ) 12.5 MG tablet Take 1 tablet (12.5 mg total) by mouth 2 (two) times daily. 07/16/23   Yolande Lamar BROCKS, MD  empagliflozin  (JARDIANCE ) 10 MG TABS tablet Take 1 tablet (10 mg total) by mouth daily. 07/16/23   Yolande Lamar BROCKS, MD  ENTRESTO  24-26 MG Take 1 tablet by mouth 2 (two) times daily. 07/16/23   Yolande Lamar BROCKS, MD  fluconazole  (DIFLUCAN ) 40 MG/ML suspension Take 100 mg by mouth daily. 06/12/22   [provider]  fluticasone  (FLONASE ) 50 MCG/ACT nasal spray Place 2 sprays into both nostrils daily. 07/16/23   Yolande Lamar BROCKS, MD  gabapentin  (NEURONTIN ) 100 MG capsule Take 1 capsule (100 mg total) by mouth 2  (two) times daily. 07/16/23   Yolande Lamar BROCKS, MD  LANTUS  SOLOSTAR 100 UNIT/ML Solostar Pen Inject 56 Units into the skin at bedtime. 07/16/23   Yolande Lamar BROCKS, MD  loperamide  (IMODIUM ) 2 MG capsule Take 1 capsule (2 mg total) by mouth 4 (four) times daily as needed for diarrhea or loose stools. 07/16/23   Yolande Lamar BROCKS, MD  magnesium  oxide (MAG-OX) 400 MG tablet Take 800 mg by mouth 2 (two) times daily. 06/04/23 06/03/24  [provider]  montelukast  (SINGULAIR ) 10 MG tablet Take 1 tablet (10 mg total) by mouth at bedtime. 07/16/23   Yolande Lamar BROCKS, MD  Multiple Vitamin (MULTIVITAMIN WITH MINERALS) TABS tablet Take 4 tablets by mouth daily.    [provider]  NIFEdipine  (PROCARDIA -XL/NIFEDICAL-XL) 30 MG 24 hr tablet Take 1 tablet (30 mg total) by mouth daily. 07/16/23   Yolande Lamar BROCKS, MD  ondansetron  (ZOFRAN -ODT) 4 MG disintegrating tablet Take 1 tablet by mouth every 8 (eight) hours as needed. 12/31/22   [provider]  pantoprazole  (PROTONIX ) 40 MG tablet Take 1 tablet (40 mg total) by mouth daily. 07/16/23   Yolande Lamar BROCKS, MD  sertraline  (ZOLOFT ) 20 MG/ML concentrated solution Take 5 mLs (100 mg total) by mouth daily. 07/16/23 07/15/24  Yolande Lamar BROCKS, MD  SYMBICORT  160-4.5 MCG/ACT inhaler Inhale 1 puff into the lungs daily. 07/16/23   Yolande Lamar BROCKS, MD    Allergies: Allergies  Allergen Reactions   Metformin Other (See Comments) and Swelling    Unable to walk   Patanol [Olopatadine]     Unknown reaction    Paroxetine Hcl Rash    Review of Systems: Review of Systems  Constitutional:  Negative for chills and fever.  Respiratory:  Negative for shortness of breath.   Cardiovascular:  Negative for chest pain.  Gastrointestinal:  Negative for nausea and vomiting.    Physical Exam BP 114/75   Pulse 90   Ht 5' 1 (1.549 m)   Wt 162 lb (73.5 kg)   SpO2 99%   BMI 30.61 kg/m  CONSTITUTIONAL: No acute distress HEENT:  Normocephalic,  atraumatic, extraocular motion intact. RESPIRATORY:  Normal respiratory effort without pathologic use of accessory muscles. CARDIOVASCULAR: Regular rhythm and rate BREAST: Left breast status post lumpectomy in the left upper outer quadrant periareolar incision which is well-healed.  No palpable masses, any skin changes, or nipple changes.  No left axillary lymphadenopathy.  Right breast status postmastectomy and sentinel lymph node biopsy without any palpable evidence of recurrence, skin changes, and no right axillary lymphadenopathy. NEUROLOGIC:  Motor and sensation is grossly normal.  Cranial nerves are grossly intact. PSYCH:  Alert and oriented to person, place and time. Affect is normal.  Labs/Imaging: Left  breast mammogram and ultrasound on 10/31/2023: FINDINGS: Examination demonstrates a stable asymmetry over the posterior midportion of the left breast on the MLO view as this has been stable for 1 year. There are stable post lumpectomy changes of the left retroareolar region. There is slight interval increase in size of an oval predominately circumscribed mass over the upper outer left breast in the anterior third. Remainder of the left breast is not significantly changed.   Targeted ultrasound is performed, showing slight increase in size of an oval hypoechoic mass with slight border irregularity at the 2 o'clock position of the left breast 3 cm from the nipple measuring 4 x 4 x 6 mm. This likely represents a focus of fat necrosis, although is indeterminate.   Ultrasound of the left axilla is normal.   IMPRESSION: 1. Stable probable benign asymmetry over the posterior left mid breast. This has been stable for 1 year.   2. Interval increase in size of an indeterminate 6 mm mass over the 2 o'clock position of the left breast 3 cm from the nipple.    3.  Stable post lumpectomy changes of the left breast.   RECOMMENDATION: 1. Recommend ultrasound-guided core needle biopsy of the  indeterminate 6 mm mass over the 2 o'clock position of the left breast.   2. Recommend an additional follow-up diagnostic left breast mammogram in 1 year to document 2 years of stability of the probable benign asymmetry over the posterior left mid breast   I have discussed the findings and recommendations with the patient. If applicable, a reminder letter will be sent to the patient regarding the next appointment.   BI-RADS CATEGORY  4: Suspicious.   Left breast biopsy on 11/06/2023: FINAL DIAGNOSIS       1. Breast, left, needle core biopsy, 2:00 3cmfn 6mm (coil clip) :       - FAT NECROSIS WITH ASSOCIATED INFLAMMATION AND FIBROSIS, SEE NOTE       - NEGATIVE FOR CARCINOMA   Assessment and Plan: This is a 68 y.o. female status post left breast lumpectomy.  - Discussed with patient the findings on her left breast mammogram a month ago and the biopsy results.  Overall the area that had increased in size was found to be fat necrosis and found to be concordant between pathology and imaging findings.  At this point, no other procedures are needed.  She will need repeat left breast mammogram in 1 year.  No suspicious findings on exam today in the right side. - Follow-up with me in 1 year.  I spent 20 minutes dedicated to the care of this patient on the date of this encounter to include pre-visit review of records, face-to-face time with the patient discussing diagnosis and management, and any post-visit coordination of care.   Aloysius Sheree Plant, MD [facility]

## 2024-04-10 ENCOUNTER — Other Ambulatory Visit

## 2024-04-10 ENCOUNTER — Ambulatory Visit: Admitting: Internal Medicine
